# Patient Record
Sex: Male | Born: 1955 | Race: White | Hispanic: No | Smoking: Former smoker
Health system: Southern US, Community
[De-identification: ages and names within clinical notes are randomized; demographics above are authoritative.]

## PROBLEM LIST (undated history)

## (undated) DIAGNOSIS — J449 Chronic obstructive pulmonary disease, unspecified: Secondary | ICD-10-CM

## (undated) DIAGNOSIS — I10 Essential (primary) hypertension: Secondary | ICD-10-CM

## (undated) DIAGNOSIS — U099 Post covid-19 condition, unspecified: Secondary | ICD-10-CM

## (undated) DIAGNOSIS — I509 Heart failure, unspecified: Secondary | ICD-10-CM

---

## 1988-01-14 HISTORY — PX: BACK SURGERY: SHX140

## 2019-07-01 ENCOUNTER — Emergency Department (HOSPITAL_COMMUNITY)
Admission: EM | Admit: 2019-07-01 | Discharge: 2019-07-01 | Disposition: A | Discharge status: Home / Self Care | Payer: No Typology Code available for payment source | Attending: Emergency Medicine | Admitting: Emergency Medicine

## 2019-07-01 ENCOUNTER — Other Ambulatory Visit: Payer: Self-pay

## 2019-07-01 ENCOUNTER — Encounter (HOSPITAL_COMMUNITY): Payer: Self-pay

## 2019-07-01 DIAGNOSIS — I10 Essential (primary) hypertension: Secondary | ICD-10-CM | POA: Insufficient documentation

## 2019-07-01 DIAGNOSIS — Z87891 Personal history of nicotine dependence: Secondary | ICD-10-CM | POA: Diagnosis not present

## 2019-07-01 DIAGNOSIS — R042 Hemoptysis: Secondary | ICD-10-CM | POA: Diagnosis present

## 2019-07-01 DIAGNOSIS — J449 Chronic obstructive pulmonary disease, unspecified: Secondary | ICD-10-CM | POA: Diagnosis not present

## 2019-07-01 DIAGNOSIS — Z79899 Other long term (current) drug therapy: Secondary | ICD-10-CM | POA: Insufficient documentation

## 2019-07-01 HISTORY — DX: Essential (primary) hypertension: I10

## 2019-07-01 HISTORY — DX: Chronic obstructive pulmonary disease, unspecified: J44.9

## 2019-07-01 LAB — CBC WITH DIFFERENTIAL/PLATELET
Abs Immature Granulocytes: 0.06 10*3/uL (ref 0.00–0.07)
Basophils Absolute: 0.1 10*3/uL (ref 0.0–0.1)
Basophils Relative: 1 %
Eosinophils Absolute: 0.3 10*3/uL (ref 0.0–0.5)
Eosinophils Relative: 3 %
HCT: 42.9 % (ref 39.0–52.0)
Hemoglobin: 14.8 g/dL (ref 13.0–17.0)
Immature Granulocytes: 1 %
Lymphocytes Relative: 24 %
Lymphs Abs: 2.2 10*3/uL (ref 0.7–4.0)
MCH: 29.4 pg (ref 26.0–34.0)
MCHC: 34.5 g/dL (ref 30.0–36.0)
MCV: 85.3 fL (ref 80.0–100.0)
Monocytes Absolute: 0.9 10*3/uL (ref 0.1–1.0)
Monocytes Relative: 10 %
Neutro Abs: 5.4 10*3/uL (ref 1.7–7.7)
Neutrophils Relative %: 61 %
Platelets: 269 10*3/uL (ref 150–400)
RBC: 5.03 MIL/uL (ref 4.22–5.81)
RDW: 13 % (ref 11.5–15.5)
WBC: 8.9 10*3/uL (ref 4.0–10.5)
nRBC: 0 % (ref 0.0–0.2)

## 2019-07-01 LAB — COMPREHENSIVE METABOLIC PANEL
ALT: 46 U/L — ABNORMAL HIGH (ref 0–44)
AST: 37 U/L (ref 15–41)
Albumin: 4.2 g/dL (ref 3.5–5.0)
Alkaline Phosphatase: 68 U/L (ref 38–126)
Anion gap: 14 (ref 5–15)
BUN: 13 mg/dL (ref 8–23)
CO2: 25 mmol/L (ref 22–32)
Calcium: 9.5 mg/dL (ref 8.9–10.3)
Chloride: 96 mmol/L — ABNORMAL LOW (ref 98–111)
Creatinine, Ser: 0.88 mg/dL (ref 0.61–1.24)
GFR calc Af Amer: >60 mL/min (ref >60)
GFR calc non Af Amer: >60 mL/min (ref >60)
Glucose, Bld: 101 mg/dL — ABNORMAL HIGH (ref 70–99)
Potassium: 3.4 mmol/L — ABNORMAL LOW (ref 3.5–5.1)
Sodium: 135 mmol/L (ref 135–145)
Total Bilirubin: 0.9 mg/dL (ref 0.3–1.2)
Total Protein: 6.9 g/dL (ref 6.5–8.1)

## 2019-07-01 LAB — PROTIME-INR
INR: 1 (ref 0.8–1.2)
Prothrombin Time: 13 seconds (ref 11.4–15.2)

## 2019-07-01 NOTE — ED Triage Notes (Signed)
Pt arrives POV for eval of spitting out blood. Pt reports that he has spit 2 times this morning and noted that he had flecks of blood in the spit. Pt denies that he was coughing, or vomiting or wretching States he just ""spits like normal" and theres blood in it. Denies abd pain, CP. Denies cough, denies thinners. States unsure if he is bleeding somewhere in his mouth

## 2019-07-01 NOTE — ED Provider Notes (Signed)
[facility] EMERGENCY DEPARTMENT Provider Note   CSN: [id number] Arrival date & time: 07/01/19  1219     History Chief Complaint  Patient presents with  . Spitting Up Blood    Tanner Chang is a 64 y.o. male possible history of COPD, hypertension who presents for evaluation of spitting up blood that began today.  He reports that he chews dip tobacco.  He reports that this morning while chewing, he started noticing some blood when he spit.  He states that he stopped chewing the tobacco and noticed some bright red blood in his spit.  He states he did not cough or vomit.  He states that this was not forceful.  He states again, he did at 11 AM and noticed some blood as well.  Since 11 AM, whenever he spits up, he has blood noted in it.  It is always bright red blood.  He has not any cough or vomiting.  He has had no nosebleeds.  He does not feel any sores or bleeding in his mouth.  He states that only when he spits.  He states he has never had this happen before.  He is not on any blood thinners.  He denies any pain in his nose, teeth.  He denies any fevers, night sweats, weight loss, chest pain, difficulty breathing, abdominal pain, cough, vomiting.   The history is provided by the patient.       Past Medical History:  Diagnosis Date  . COPD (chronic obstructive pulmonary disease) (HCC)   . Hypertension     There are no problems to display for this patient.   History reviewed. No pertinent surgical history.     No family history on file.  Social History   Tobacco Use  . Smoking status: Former Games developer  . Smokeless tobacco: Former Engineer, water Use Topics  . Alcohol use: Yes  . Drug use: Not Currently    Home Medications Prior to Admission medications   Medication Sig Start Date End Date Taking? Authorizing Provider  acetaminophen (TYLENOL) 500 MG tablet Take 1,000 mg by mouth every 6 (six) hours as needed for mild pain or headache.   Yes [provider]  amLODipine (NORVASC) 5 MG tablet Take 5 mg by mouth in the morning.   Yes [provider]  atorvastatin (LIPITOR) 10 MG tablet Take 10 mg by mouth at bedtime.   Yes [provider]  budesonide-formoterol (SYMBICORT) 160-4.5 MCG/ACT inhaler Inhale 2 puffs into the lungs 2 (two) times daily.   Yes [provider]  buPROPion (WELLBUTRIN XL) 300 MG 24 hr tablet Take 300 mg by mouth daily with breakfast.   Yes [provider]  Cholecalciferol (VITAMIN D-3) 25 MCG (1000 UT) CAPS Take 1,000 Units by mouth daily.   Yes [provider]  FLUoxetine (PROZAC) 20 MG capsule Take 20 mg by mouth in the morning.   Yes [provider]  hydrochlorothiazide (HYDRODIURIL) 25 MG tablet Take 25 mg by mouth in the morning.   Yes [provider]  Ipratropium-Albuterol (COMBIVENT RESPIMAT) 20-100 MCG/ACT AERS respimat Inhale 1 puff into the lungs 4 (four) times daily as needed for wheezing or shortness of breath.   Yes [provider]  losartan (COZAAR) 100 MG tablet Take 100 mg by mouth in the morning.   Yes [provider]  Multiple Vitamins-Minerals (ONE-A-DAY MENS 50+ ADVANTAGE) TABS Take 1 tablet by mouth daily with breakfast.   Yes [provider]  omeprazole (PRILOSEC) 20 MG capsule Take 20 mg by mouth daily before breakfast.   Yes [provider]  Oxcarbazepine (TRILEPTAL) 300 MG tablet Take 300 mg by mouth 2 (two) times daily.   Yes [provider]  Psyllium (METAMUCIL PO) Take by mouth See admin instructions. Mix 1 tablespoonful of powder into 8 ounces of water and drink once a day   Yes [provider]  tiotropium (SPIRIVA HANDIHALER) 18 MCG inhalation capsule Place 18 mcg into inhaler and inhale daily.   Yes [provider]    Allergies    Penicillins and Sulfamethoxazole-trimethoprim  Review of Systems   Review of Systems  Constitutional: Negative for fever.  HENT:        Spitting up blood  Respiratory: Negative for cough and shortness of breath.   Cardiovascular: Negative for chest pain.  Gastrointestinal: Negative for abdominal pain, nausea and vomiting.  Genitourinary: Negative for dysuria.  Neurological: Negative for weakness, numbness and headaches.  All other systems reviewed and are negative.   Physical Exam Updated Vital Signs BP (!) 144/91   Pulse (!) 109   Temp 99.2 F (37.3 C) (Oral)   Resp 20   Ht 5\' 6"  (1.676 m)   Wt 80.7 kg   SpO2 94%   BMI 28.73 kg/m   Physical Exam Vitals and nursing note reviewed.  Constitutional:      Appearance: Normal appearance. He is well-developed.     Comments: Anxious. NAD.   HENT:     Head: Normocephalic and atraumatic.     Nose:     Right Nostril: No epistaxis.     Left Nostril: No epistaxis.     Mouth/Throat:     Pharynx: Oropharynx is clear. Posterior oropharyngeal erythema present. No pharyngeal swelling.     Comments: He has some slight posterior oropharynx erythema but no obvious area of bleeding or hemorrhage.  No oral lesions.  No sores to the tongue, underneath the tongue.  No evidence of gingival bleeding. Eyes:     General: Lids are normal.     Conjunctiva/sclera: Conjunctivae normal.     Pupils: Pupils are equal, round, and reactive to light.  Cardiovascular:     Rate and Rhythm: Regular rhythm. Tachycardia present.     Pulses: Normal pulses.     Heart sounds: Normal heart sounds. No murmur heard.  No friction rub. No gallop.   Pulmonary:     Effort: Pulmonary effort is normal.     Breath sounds: Normal breath sounds.     Comments: Lungs clear to auscultation bilaterally.  Symmetric chest rise.  No wheezing, rales, rhonchi. Abdominal:     Palpations: Abdomen is soft. Abdomen is not rigid.     Tenderness: There is no abdominal tenderness. There is no guarding.     Comments: Abdomen is soft, non-distended, non-tender. No rigidity, No guarding. No peritoneal signs.   Musculoskeletal:        General: Normal range of motion.     Cervical back: Full passive range of motion without pain.  Skin:    General: Skin is warm and dry.     Capillary Refill: Capillary refill takes less than 2 seconds.  Neurological:     Mental Status: He is alert and oriented to person, place, and time.  Psychiatric:        Speech: Speech normal.     ED Results / Procedures / Treatments   Labs (all labs ordered are listed, but only abnormal results are displayed)  Labs Reviewed  COMPREHENSIVE METABOLIC PANEL - Abnormal; Notable for the following components:      Result Value   Potassium 3.4 (*)    Chloride 96 (*)    Glucose, Bld 101 (*)    ALT 46 (*)    All other components within normal limits  CBC WITH DIFFERENTIAL/PLATELET  PROTIME-INR    EKG EKG Interpretation  Date/Time:  Friday July 01 2019 20:36:17 EDT Ventricular Rate:  113 PR Interval:    QRS Duration: 93 QT Interval:  336 QTC Calculation: 461 R Axis:   38 Text Interpretation: Sinus tachycardia Low voltage, precordial leads Borderline T abnormalities, inferior leads No prior ecg for comparison Confirmed by Geoffery Lyons ([id number]) on 07/01/2019 8:42:30 PM   Radiology No results found.  Procedures Procedures (including critical care time)  Medications Ordered in ED Medications - No data to display  ED Course  I have reviewed the triage vital signs and the nursing notes.  Pertinent labs & imaging results that were available during my care of the patient were reviewed by me and considered in my medical decision making (see chart for details).    MDM Rules/Calculators/A&P                          64 year old male who presents for evaluation of spitting up blood that began this morning.  He has not had any cough or exertional or forceful vomiting.  No chest pain, difficulty breathing.  Initially arrival, he is afebrile, nontoxic-appearing.  Vital signs are stable.  I did have him spit here in the  emergency department which revealed bright red blood.  I do not see an evidence of obvious sore or lesion in his mouth that would be causing the bleeding.  He has some erythema to his throat but I do not see any obvious lesion that is bleeding.  No bleeding in the nares.  We will plan to check basic labs.  CMP shows potassium of 3.4.  BUN and creatinine within normal limits.  INR is 1.0.  CBC shows no leukocytosis.  Hemoglobin stable at 14.8.  I discussed with Dr. Pollyann Kennedy (ENT).  Given reassuring work-up and that patient is stable, feels that it is appropriate for outpatient management.  Patient can follow-up with ENT office.  At this time, patient is slightly tachycardic.  Expect this most likely anxiety as he is very anxious.  I do not see any obvious source of bleeding in his mouth that would explain his symptoms.  There is no gingival erythema or irritation.  He has no cough or vomiting that is causing the spitting up blood.  At this time, feel he is appropriate for outpatient follow-up with ENT. Discussed patient with Dr. Judd Lien who is agreeable and who evaluated patient.  At this time, no negation that patient needs any further imaging as he does not have any hemoptysis or hematemesis.  Discussed results and plan with patient.  He is agreeable.  Will give outpatient ENT follow-up.  Patient instructed to return emergency department he has any worsening symptoms. At this time, patient exhibits no emergent life-threatening condition that require further evaluation in ED or admission. Patient had ample opportunity for questions and discussion. All patient's questions were answered with full understanding. Strict return precautions discussed. Patient expresses understanding and agreement to plan.   Portions of this note were generated with Scientist, clinical (histocompatibility and immunogenetics). Dictation errors may occur despite best attempts at proofreading.  Final Clinical Impression(s) /  ED Diagnoses Final diagnoses:  Spitting up  blood    Rx / DC Orders ED Discharge Orders    None       Rosana Hoes 07/01/19 2318    Geoffery Lyons, MD 07/03/19 1905

## 2019-07-01 NOTE — ED Notes (Signed)
Pt reports that he has been spititng up bright red blood today and having bright red blood in his BM today, pt reports hx of the same with his IBS

## 2019-07-01 NOTE — Discharge Instructions (Signed)
Your blood work here today looked stable.  Follow-up with referred ENT office.  Call their office arrange for follow-up appointment.  If you start having worsening bleeding, there is a larger amounts, you start coughing up blood or vomiting up blood, start having trouble breathing or any other worsening concerning symptoms, return to the emergency department for further evaluation.

## 2019-12-14 ENCOUNTER — Other Ambulatory Visit: Payer: Self-pay

## 2019-12-14 ENCOUNTER — Emergency Department (HOSPITAL_COMMUNITY)
Admission: EM | Admit: 2019-12-14 | Discharge: 2019-12-14 | Disposition: A | Discharge status: Home / Self Care | Payer: No Typology Code available for payment source | Attending: Emergency Medicine | Admitting: Emergency Medicine

## 2019-12-14 ENCOUNTER — Emergency Department (HOSPITAL_COMMUNITY): Payer: No Typology Code available for payment source

## 2019-12-14 DIAGNOSIS — Z7951 Long term (current) use of inhaled steroids: Secondary | ICD-10-CM | POA: Insufficient documentation

## 2019-12-14 DIAGNOSIS — R000 Tachycardia, unspecified: Secondary | ICD-10-CM | POA: Insufficient documentation

## 2019-12-14 DIAGNOSIS — Z79899 Other long term (current) drug therapy: Secondary | ICD-10-CM | POA: Diagnosis not present

## 2019-12-14 DIAGNOSIS — I10 Essential (primary) hypertension: Secondary | ICD-10-CM | POA: Insufficient documentation

## 2019-12-14 DIAGNOSIS — Z87891 Personal history of nicotine dependence: Secondary | ICD-10-CM | POA: Insufficient documentation

## 2019-12-14 DIAGNOSIS — J441 Chronic obstructive pulmonary disease with (acute) exacerbation: Secondary | ICD-10-CM | POA: Diagnosis not present

## 2019-12-14 DIAGNOSIS — I2510 Atherosclerotic heart disease of native coronary artery without angina pectoris: Secondary | ICD-10-CM | POA: Diagnosis present

## 2019-12-14 DIAGNOSIS — Z20822 Contact with and (suspected) exposure to covid-19: Secondary | ICD-10-CM | POA: Diagnosis not present

## 2019-12-14 DIAGNOSIS — R0602 Shortness of breath: Secondary | ICD-10-CM | POA: Diagnosis present

## 2019-12-14 LAB — TROPONIN I (HIGH SENSITIVITY)
Troponin I (High Sensitivity): 6 ng/L (ref <18)
Troponin I (High Sensitivity): 7 ng/L (ref <18)

## 2019-12-14 LAB — BASIC METABOLIC PANEL
Anion gap: 11 (ref 5–15)
BUN: 17 mg/dL (ref 8–23)
CO2: 28 mmol/L (ref 22–32)
Calcium: 9.5 mg/dL (ref 8.9–10.3)
Chloride: 95 mmol/L — ABNORMAL LOW (ref 98–111)
Creatinine, Ser: 1.05 mg/dL (ref 0.61–1.24)
GFR, Estimated: >60 mL/min (ref >60)
Glucose, Bld: 107 mg/dL — ABNORMAL HIGH (ref 70–99)
Potassium: 3.7 mmol/L (ref 3.5–5.1)
Sodium: 134 mmol/L — ABNORMAL LOW (ref 135–145)

## 2019-12-14 LAB — CBC
HCT: 39.9 % (ref 39.0–52.0)
Hemoglobin: 13.8 g/dL (ref 13.0–17.0)
MCH: 28.9 pg (ref 26.0–34.0)
MCHC: 34.6 g/dL (ref 30.0–36.0)
MCV: 83.6 fL (ref 80.0–100.0)
Platelets: 291 10*3/uL (ref 150–400)
RBC: 4.77 MIL/uL (ref 4.22–5.81)
RDW: 13.8 % (ref 11.5–15.5)
WBC: 8.8 10*3/uL (ref 4.0–10.5)
nRBC: 0 % (ref 0.0–0.2)

## 2019-12-14 LAB — RESP PANEL BY RT-PCR (FLU A&B, COVID) ARPGX2
Influenza A by PCR: NEGATIVE
Influenza B by PCR: NEGATIVE
SARS Coronavirus 2 by RT PCR: NEGATIVE

## 2019-12-14 LAB — BRAIN NATRIURETIC PEPTIDE: B Natriuretic Peptide: 24.4 pg/mL (ref 0.0–100.0)

## 2019-12-14 MED ORDER — IPRATROPIUM-ALBUTEROL 0.5-2.5 (3) MG/3ML IN SOLN
3.0000 mL | Freq: Once | RESPIRATORY_TRACT | Status: AC
  Administered 2019-12-14: 3 mL via RESPIRATORY_TRACT
  Filled 2019-12-14: qty 3

## 2019-12-14 MED ORDER — ALBUTEROL SULFATE HFA 108 (90 BASE) MCG/ACT IN AERS
2.0000 | INHALATION_SPRAY | Freq: Once | RESPIRATORY_TRACT | Status: AC
  Administered 2019-12-14: 2 via RESPIRATORY_TRACT
  Filled 2019-12-14: qty 6.7

## 2019-12-14 MED ORDER — IPRATROPIUM-ALBUTEROL 20-100 MCG/ACT IN AERS
4.0000 | INHALATION_SPRAY | Freq: Once | RESPIRATORY_TRACT | Status: AC
  Administered 2019-12-14: 4 via RESPIRATORY_TRACT
  Filled 2019-12-14: qty 4

## 2019-12-14 MED ORDER — PREDNISONE 20 MG PO TABS: 40.0000 mg | ORAL_TABLET | Freq: Every day | ORAL | 0 refills | Status: AC

## 2019-12-14 MED ORDER — ALBUTEROL SULFATE HFA 108 (90 BASE) MCG/ACT IN AERS: 2.0000 | INHALATION_SPRAY | RESPIRATORY_TRACT | 0 refills | Status: AC | PRN

## 2019-12-14 MED ORDER — METHYLPREDNISOLONE SODIUM SUCC 125 MG IJ SOLR
125.0000 mg | Freq: Once | INTRAMUSCULAR | Status: AC
  Administered 2019-12-14: 125 mg via INTRAVENOUS
  Filled 2019-12-14: qty 2

## 2019-12-14 MED ORDER — IOHEXOL 350 MG/ML SOLN
100.0000 mL | Freq: Once | INTRAVENOUS | Status: AC | PRN
  Administered 2019-12-14: 82 mL via INTRAVENOUS

## 2019-12-14 NOTE — ED Provider Notes (Signed)
[facility] Provider Note   CSN: [id number] Arrival date & time: 12/14/19  1419     History No chief complaint on file.   Tanner Chang is a 64 y.o. male.  COPD hypertension presents to ER with concern for shortness of breath.  Patient reports over the past couple weeks he has had steadily increasing shortness of breath, worse over the last few days, reports he also has had cough, dry nonproductive.  No chest pain.  Breathing worse with exertion.  Also over the last few days has noted increase in leg swelling.  Reports that he was started on a fluid pill yesterday but has not started it.  Denies prior history of heart problems.  HPI     Past Medical History:  Diagnosis Date  . COPD (chronic obstructive pulmonary disease) (HCC)   . Hypertension     Patient Active Problem List   Diagnosis Date Noted  . Coronary artery calcification seen on CT scan 12/14/2019    No past surgical history on file.     No family history on file.  Social History   Tobacco Use  . Smoking status: Former Games developermoker  . Smokeless tobacco: Former Engineer, waterUser  Substance Use Topics  . Alcohol use: Yes  . Drug use: Not Currently    Home Medications Prior to Admission medications   Medication Sig Start Date End Date Taking? Authorizing Provider  acetaminophen (TYLENOL) 500 MG tablet Take 1,000 mg by mouth every 6 (six) hours as needed for mild pain or headache.   Yes [provider]  amLODipine (NORVASC) 5 MG tablet Take 5 mg by mouth in the morning.   Yes [provider]  atorvastatin (LIPITOR) 10 MG tablet Take 10 mg by mouth at bedtime.   Yes [provider]  budesonide-formoterol (SYMBICORT) 160-4.5 MCG/ACT inhaler Inhale 2 puffs into the lungs 2 (two) times daily.   Yes [provider]  buPROPion (WELLBUTRIN XL) 300 MG 24 hr tablet Take 300 mg by mouth daily with breakfast.   Yes [provider]  Cholecalciferol  (VITAMIN D-3) 25 MCG (1000 UT) CAPS Take 1,000 Units by mouth daily.   Yes [provider]  FLUoxetine (PROZAC) 20 MG capsule Take 20 mg by mouth in the morning.   Yes [provider]  hydrochlorothiazide (HYDRODIURIL) 25 MG tablet Take 25 mg by mouth in the morning.   Yes [provider]  Ipratropium-Albuterol (COMBIVENT RESPIMAT) 20-100 MCG/ACT AERS respimat Inhale 1 puff into the lungs 4 (four) times daily as needed for wheezing or shortness of breath.   Yes [provider]  losartan (COZAAR) 100 MG tablet Take 100 mg by mouth in the morning.   Yes [provider]  Multiple Vitamins-Minerals (ONE-A-DAY MENS 50+ ADVANTAGE) TABS Take 1 tablet by mouth daily with breakfast.   Yes [provider]  omeprazole (PRILOSEC) 20 MG capsule Take 20 mg by mouth daily before breakfast.   Yes [provider]  Oxcarbazepine (TRILEPTAL) 300 MG tablet Take 300 mg by mouth 2 (two) times daily.   Yes [provider]  Psyllium (METAMUCIL PO) Take by mouth See admin instructions. Mix 1 tablespoonful of powder into 8 ounces of water and drink once a day   Yes [provider]  tiotropium (SPIRIVA HANDIHALER) 18 MCG inhalation capsule Place 18 mcg into inhaler and inhale daily.   Yes [provider]  albuterol (VENTOLIN HFA) 108 (90 Base) MCG/ACT inhaler Inhale 2 puffs into the  lungs every 4 (four) hours as needed for wheezing or shortness of breath. 12/14/19   Milagros Loll, MD  predniSONE (DELTASONE) 20 MG tablet Take 2 tablets (40 mg total) by mouth daily for 5 days. 12/14/19 12/19/19  Milagros Loll, MD    Allergies    Penicillins and Sulfamethoxazole-trimethoprim  Review of Systems   Review of Systems  Constitutional: Positive for fatigue. Negative for chills and fever.  HENT: Negative for ear pain and sore throat.   Eyes: Negative for pain and visual disturbance.  Respiratory: Positive for cough and shortness of  breath.   Cardiovascular: Positive for leg swelling. Negative for chest pain and palpitations.  Gastrointestinal: Negative for abdominal pain and vomiting.  Genitourinary: Negative for dysuria and hematuria.  Musculoskeletal: Negative for arthralgias and back pain.  Skin: Negative for color change and rash.  Neurological: Negative for seizures and syncope.  All other systems reviewed and are negative.   Physical Exam Updated Vital Signs BP (!) 160/101   Pulse (!) 107   Temp 98.4 F (36.9 C) (Oral)   Resp 20   Ht 5\' 6"  (1.676 m)   Wt 94.8 kg   SpO2 91%   BMI 33.73 kg/m   Physical Exam Vitals and nursing note reviewed.  Constitutional:      Appearance: He is well-developed.  HENT:     Head: Normocephalic and atraumatic.  Eyes:     Conjunctiva/sclera: Conjunctivae normal.  Cardiovascular:     Rate and Rhythm: Regular rhythm. Tachycardia present.     Heart sounds: No murmur heard.   Pulmonary:     Comments: Mild tachypnea, speaking full sentences, expiratory wheezing bilaterally Abdominal:     Palpations: Abdomen is soft.     Tenderness: There is no abdominal tenderness.  Musculoskeletal:     Cervical back: Neck supple.     Comments: Bilateral pitting edema to lower knees  Skin:    General: Skin is warm and dry.  Neurological:     General: No focal deficit present.     Mental Status: He is alert.  Psychiatric:        Mood and Affect: Mood normal.        Behavior: Behavior normal.     ED Results / Procedures / Treatments   Labs (all labs ordered are listed, but only abnormal results are displayed) Labs Reviewed  BASIC METABOLIC PANEL - Abnormal; Notable for the following components:      Result Value   Sodium 134 (*)    Chloride 95 (*)    Glucose, Bld 107 (*)    All other components within normal limits  RESP PANEL BY RT-PCR (FLU A&B, COVID) ARPGX2  CBC  BRAIN NATRIURETIC PEPTIDE  TROPONIN I (HIGH SENSITIVITY)  TROPONIN I (HIGH SENSITIVITY)     EKG EKG Interpretation  Date/Time:  Wednesday December 14 2019 15:11:30 EST Ventricular Rate:  104 PR Interval:  148 QRS Duration: 84 QT Interval:  324 QTC Calculation: 426 R Axis:   57 Text Interpretation: Sinus tachycardia Otherwise normal ECG Confirmed by [id number] (Marianna Fuss) on 12/14/2019 4:43:35 PM   Radiology DG Chest 2 View  Result Date: 12/14/2019 CLINICAL DATA:  Short of breath for 1 day, COPD, history of tobacco abuse EXAM: CHEST - 2 VIEW COMPARISON:  None. FINDINGS: Frontal and lateral views of the chest demonstrate an unremarkable cardiac silhouette. There is elevation of the left hemidiaphragm, of uncertain chronicity. No acute airspace disease, effusion, or pneumothorax. No acute bony abnormalities. IMPRESSION:  1. Elevated left hemidiaphragm of uncertain chronicity. 2. No acute airspace disease. Electronically Signed   By: Sharlet Salina M.D.   On: 12/14/2019 15:50   CT Angio Chest PE W and/or Wo Contrast  Result Date: 12/14/2019 CLINICAL DATA:  c/o sob ,wheezing in triage , pt was started on lasix but states that he misplaced it so he only had one dose EXAM: CT ANGIOGRAPHY CHEST WITH CONTRAST TECHNIQUE: Multidetector CT imaging of the chest was performed using the standard protocol during bolus administration of intravenous contrast. Multiplanar CT image reconstructions and MIPs were obtained to evaluate the vascular anatomy. CONTRAST:  78mL OMNIPAQUE IOHEXOL 350 MG/ML SOLN COMPARISON:  Current chest radiograph. FINDINGS: Cardiovascular: Pulmonary arteries are well opacified. There is no evidence of a pulmonary embolism. Heart is normal in size. No pericardial effusion. Left coronary artery calcifications. Great vessels are normal caliber. Minor aortic atherosclerosis. No dissection. Mediastinum/Nodes: Visualized thyroid is unremarkable. No neck base, axillary, mediastinal or hilar masses or enlarged lymph nodes. Trachea and esophagus are unremarkable. Lungs/Pleura:  Dependent atelectasis at the left lung base above an elevated hemidiaphragm. Remainder of the lungs is clear. No pleural effusion. No pneumothorax. Upper Abdomen: No acute abnormality. Musculoskeletal: No fracture or acute finding. No osteoblastic or osteolytic lesions. Review of the MIP images confirms the above findings. IMPRESSION: 1. No evidence of a pulmonary embolism. 2. No acute findings. No evidence of pneumonia or pulmonary edema. Opacity at the left lung base above an elevated hemidiaphragm is consistent with atelectasis. 3. Left coronary artery calcifications and minor aortic atherosclerosis. Aortic Atherosclerosis (ICD10-I70.0). Electronically Signed   By: Amie Portland M.D.   On: 12/14/2019 18:38    Procedures Procedures (including critical care time)  Medications Ordered in ED Medications  albuterol (VENTOLIN HFA) 108 (90 Base) MCG/ACT inhaler 2 puff (2 puffs Inhalation Given 12/14/19 1842)  methylPREDNISolone sodium succinate (SOLU-MEDROL) 125 mg/2 mL injection 125 mg (125 mg Intravenous Given 12/14/19 1839)  Ipratropium-Albuterol (COMBIVENT) respimat 4 puff (4 puffs Inhalation Given 12/14/19 1838)  iohexol (OMNIPAQUE) 350 MG/ML injection 100 mL (82 mLs Intravenous Contrast Given 12/14/19 1819)  ipratropium-albuterol (DUONEB) 0.5-2.5 (3) MG/3ML nebulizer solution 3 mL (3 mLs Nebulization Given 12/14/19 1956)  albuterol (VENTOLIN HFA) 108 (90 Base) MCG/ACT inhaler 2 puff (2 puffs Inhalation Given 12/14/19 2128)    ED Course  I have reviewed the triage vital signs and the nursing notes.  Pertinent labs & imaging results that were available during my care of the patient were reviewed by me and considered in my medical decision making (see chart for details).    MDM Rules/Calculators/A&P                         64 year old male presents to the emergency room with concern for shortness of breath.  On physical exam patient had mild tachypnea and significant expiratory wheeze but not in  frank distress.  Also noted mild edema in his lower legs.  EKG without acute ischemic change, troponin within normal limits, doubt ACS.  BNP within normal limits, low suspicion for heart failure.  CXR negative for pneumonia.  CTA chest ordered to rule out pulmonary embolism.  Negative for PE.  Radiologist commented on calcifications in his coronary arteries.  As patient has no chest pain, EKG without acute ischemic change and troponin within normal limits, still doubt ACS as presentation first symptoms today.  Given age and risk factors, believe patient would benefit from outpatient cardiology follow-up.  Patient had  marked improvement after receiving neb treatment, steroids.  Did well on ambulation trial.  Believe he is appropriate for trial of outpatient management at this time.  Reviewed return precautions in detail with patient as well as recommendation for close recheck with his primary doctor as well as cardiology.  After the discussed management above, the patient was determined to be safe for discharge.  The patient was in agreement with this plan and all questions regarding their care were answered.  ED return precautions were discussed and the patient will return to the ED with any significant worsening of condition.  Final Clinical Impression(s) / ED Diagnoses Final diagnoses:  COPD exacerbation (HCC)  Coronary artery calcification seen on CT scan    Rx / DC Orders ED Discharge Orders         Ordered    Ambulatory referral to Cardiology       Comments: Left coronary artery calcifications; lower extremity edema - please route recommendations to PCP   12/14/19 2113    albuterol (VENTOLIN HFA) 108 (90 Base) MCG/ACT inhaler  Every 4 hours PRN        12/14/19 2118    predniSONE (DELTASONE) 20 MG tablet  Daily        12/14/19 2118           Milagros Loll, MD 12/15/19 0011

## 2019-12-14 NOTE — Discharge Instructions (Addendum)
Please call your primary doctor for recheck in 24 to 48 hours.  Continue previously prescribed medications.  Start taking the prednisone as discussed.  Use inhaler as needed.  If you feel that your breathing is worsening, you develop any chest pain or difficulty in breathing, return to ER for reassessment.  Given the calcifications on your heart arteries - you should see a heart specialist soon. Call the number provided to set up an appointment.

## 2019-12-14 NOTE — ED Triage Notes (Signed)
Pt here from home with c/o sob ,wheezing in triage , pt was started on lasix but states that he misplaced it so he only had one dose

## 2020-01-04 ENCOUNTER — Emergency Department (HOSPITAL_COMMUNITY)
Admission: EM | Admit: 2020-01-04 | Discharge: 2020-01-04 | Discharge status: Against Medical Advice | Payer: No Typology Code available for payment source

## 2020-01-04 ENCOUNTER — Other Ambulatory Visit: Payer: Self-pay

## 2020-01-04 NOTE — ED Notes (Signed)
Called pt for triage, pt had already turned in labels and left

## 2020-01-06 ENCOUNTER — Encounter (HOSPITAL_COMMUNITY): Payer: Self-pay | Admitting: Emergency Medicine

## 2020-01-06 ENCOUNTER — Other Ambulatory Visit: Payer: Self-pay

## 2020-01-06 ENCOUNTER — Emergency Department (HOSPITAL_COMMUNITY): Payer: No Typology Code available for payment source

## 2020-01-06 ENCOUNTER — Emergency Department (HOSPITAL_COMMUNITY)
Admission: EM | Admit: 2020-01-06 | Discharge: 2020-01-06 | Disposition: A | Discharge status: Home / Self Care | Payer: No Typology Code available for payment source | Attending: Emergency Medicine | Admitting: Emergency Medicine

## 2020-01-06 DIAGNOSIS — R0602 Shortness of breath: Secondary | ICD-10-CM

## 2020-01-06 DIAGNOSIS — Z20822 Contact with and (suspected) exposure to covid-19: Secondary | ICD-10-CM | POA: Insufficient documentation

## 2020-01-06 DIAGNOSIS — Z87891 Personal history of nicotine dependence: Secondary | ICD-10-CM | POA: Diagnosis not present

## 2020-01-06 DIAGNOSIS — I10 Essential (primary) hypertension: Secondary | ICD-10-CM | POA: Insufficient documentation

## 2020-01-06 DIAGNOSIS — J441 Chronic obstructive pulmonary disease with (acute) exacerbation: Secondary | ICD-10-CM | POA: Diagnosis not present

## 2020-01-06 DIAGNOSIS — Z7951 Long term (current) use of inhaled steroids: Secondary | ICD-10-CM | POA: Diagnosis not present

## 2020-01-06 DIAGNOSIS — Z79899 Other long term (current) drug therapy: Secondary | ICD-10-CM | POA: Diagnosis not present

## 2020-01-06 LAB — CBC
HCT: 42.4 % (ref 39.0–52.0)
Hemoglobin: 14.7 g/dL (ref 13.0–17.0)
MCH: 28.9 pg (ref 26.0–34.0)
MCHC: 34.7 g/dL (ref 30.0–36.0)
MCV: 83.5 fL (ref 80.0–100.0)
Platelets: 302 10*3/uL (ref 150–400)
RBC: 5.08 MIL/uL (ref 4.22–5.81)
RDW: 13.9 % (ref 11.5–15.5)
WBC: 7.2 10*3/uL (ref 4.0–10.5)
nRBC: 0 % (ref 0.0–0.2)

## 2020-01-06 LAB — BASIC METABOLIC PANEL
Anion gap: 14 (ref 5–15)
BUN: 20 mg/dL (ref 8–23)
CO2: 30 mmol/L (ref 22–32)
Calcium: 10 mg/dL (ref 8.9–10.3)
Chloride: 96 mmol/L — ABNORMAL LOW (ref 98–111)
Creatinine, Ser: 0.83 mg/dL (ref 0.61–1.24)
GFR, Estimated: >60 mL/min (ref >60)
Glucose, Bld: 105 mg/dL — ABNORMAL HIGH (ref 70–99)
Potassium: 3.1 mmol/L — ABNORMAL LOW (ref 3.5–5.1)
Sodium: 140 mmol/L (ref 135–145)

## 2020-01-06 LAB — RESP PANEL BY RT-PCR (FLU A&B, COVID) ARPGX2
Influenza A by PCR: NEGATIVE
Influenza B by PCR: NEGATIVE
SARS Coronavirus 2 by RT PCR: NEGATIVE

## 2020-01-06 MED ORDER — PREDNISONE 20 MG PO TABS: 40.0000 mg | ORAL_TABLET | Freq: Once | ORAL | Status: DC

## 2020-01-06 MED ORDER — PREDNISONE 20 MG PO TABS: 40.0000 mg | ORAL_TABLET | Freq: Every day | ORAL | 0 refills | Status: DC

## 2020-01-06 MED ORDER — ALBUTEROL SULFATE (2.5 MG/3ML) 0.083% IN NEBU
5.0000 mg | INHALATION_SOLUTION | Freq: Once | RESPIRATORY_TRACT | Status: DC
  Filled 2020-01-06: qty 6

## 2020-01-06 MED ORDER — POTASSIUM CHLORIDE 20 MEQ PO PACK
40.0000 meq | PACK | Freq: Once | ORAL | Status: AC
  Administered 2020-01-06: 15:00:00 40 meq via ORAL
  Filled 2020-01-06: qty 2

## 2020-01-06 MED ORDER — PREDNISONE 20 MG PO TABS: 40.0000 mg | ORAL_TABLET | Freq: Every day | ORAL | 0 refills | Status: AC

## 2020-01-06 MED ORDER — ALBUTEROL SULFATE (2.5 MG/3ML) 0.083% IN NEBU
2.5000 mg | INHALATION_SOLUTION | Freq: Once | RESPIRATORY_TRACT | Status: AC
  Administered 2020-01-06: 16:00:00 2.5 mg via RESPIRATORY_TRACT
  Filled 2020-01-06: qty 3

## 2020-01-06 MED ORDER — METHYLPREDNISOLONE SODIUM SUCC 125 MG IJ SOLR
125.0000 mg | Freq: Once | INTRAMUSCULAR | Status: AC
  Administered 2020-01-06: 15:00:00 125 mg via INTRAVENOUS
  Filled 2020-01-06: qty 2

## 2020-01-06 MED ORDER — IPRATROPIUM-ALBUTEROL 0.5-2.5 (3) MG/3ML IN SOLN
3.0000 mL | Freq: Once | RESPIRATORY_TRACT | Status: AC
  Administered 2020-01-06: 16:00:00 3 mL via RESPIRATORY_TRACT
  Filled 2020-01-06: qty 3
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
## 2020-01-06 NOTE — ED Triage Notes (Signed)
Pt arrives to ED with c/o of shortness of breath since yesterday[place]. Pt reports worsening SOB overnight. Was recently seen here fro COPD exacerbation.

## 2020-01-06 NOTE — ED Notes (Signed)
Pt hoping to be discharged, pt removed O2 and sats remained above 93%

## 2020-01-06 NOTE — ED Notes (Signed)
Pt ambulated to BR on room air. Upon returning to room, SpO2 was at 90%. Pt placed back on 2L [place] and sats improved to 95%.

## 2020-01-06 NOTE — ED Provider Notes (Signed)
I assumed care of patient at shift change from previous team, please see their note for full H&P.  Briefly patient is here for shortness of breath.  Patient has DuoNeb and albuterol neb ordered.  He was originally on oxygen however is now off oxygen.  He does not require oxygen at baseline.  Plan is to reevaluate patient after nebs, anticipating discharge home.  After nebs patient was able to ambulate in the room on room air with his oxygen saturations remaining above 93%.  He states he feels better and wishes to go home.  He is sitting up in bed, respirations are even and unlabored, no significant notable dyspnea .  No obvious distress.  As his pharmacy at the [facility] is closed I printed his prescription for steroids.  Return precautions were discussed with patient who states their understanding.  At the time of discharge patient denied any unaddressed complaints or concerns.  Patient is agreeable for discharge home.  Note: Portions of this report may have been transcribed using voice recognition software. Every effort was made to ensure accuracy; however, inadvertent computerized transcription errors may be present       Norman Clay 01/06/20 1914    Sabas Sous, MD 01/06/20 [phone]

## 2020-01-06 NOTE — ED Notes (Signed)
Pt placed on 2L [place] for sats of 90% 

## 2020-01-25 ENCOUNTER — Other Ambulatory Visit: Payer: Self-pay

## 2020-01-25 ENCOUNTER — Encounter (HOSPITAL_COMMUNITY): Payer: Self-pay | Admitting: Emergency Medicine

## 2020-01-25 ENCOUNTER — Emergency Department (HOSPITAL_COMMUNITY)
Admission: EM | Admit: 2020-01-25 | Discharge: 2020-01-26 | Disposition: A | Discharge status: Home / Self Care | Payer: No Typology Code available for payment source | Attending: Emergency Medicine | Admitting: Emergency Medicine

## 2020-01-25 ENCOUNTER — Emergency Department (HOSPITAL_COMMUNITY): Payer: No Typology Code available for payment source

## 2020-01-25 DIAGNOSIS — R0602 Shortness of breath: Secondary | ICD-10-CM | POA: Diagnosis present

## 2020-01-25 DIAGNOSIS — Z7951 Long term (current) use of inhaled steroids: Secondary | ICD-10-CM | POA: Diagnosis not present

## 2020-01-25 DIAGNOSIS — Z20822 Contact with and (suspected) exposure to covid-19: Secondary | ICD-10-CM | POA: Diagnosis not present

## 2020-01-25 DIAGNOSIS — Z87891 Personal history of nicotine dependence: Secondary | ICD-10-CM | POA: Insufficient documentation

## 2020-01-25 DIAGNOSIS — I10 Essential (primary) hypertension: Secondary | ICD-10-CM | POA: Diagnosis not present

## 2020-01-25 DIAGNOSIS — Z79899 Other long term (current) drug therapy: Secondary | ICD-10-CM | POA: Insufficient documentation

## 2020-01-25 DIAGNOSIS — J441 Chronic obstructive pulmonary disease with (acute) exacerbation: Secondary | ICD-10-CM | POA: Insufficient documentation

## 2020-01-25 LAB — RESP PANEL BY RT-PCR (FLU A&B, COVID) ARPGX2
Influenza A by PCR: NEGATIVE
Influenza B by PCR: NEGATIVE
SARS Coronavirus 2 by RT PCR: NEGATIVE

## 2020-01-25 LAB — BASIC METABOLIC PANEL
Anion gap: 11 (ref 5–15)
BUN: 20 mg/dL (ref 8–23)
CO2: 28 mmol/L (ref 22–32)
Calcium: 10.1 mg/dL (ref 8.9–10.3)
Chloride: 100 mmol/L (ref 98–111)
Creatinine, Ser: 1.05 mg/dL (ref 0.61–1.24)
GFR, Estimated: >60 mL/min (ref >60)
Glucose, Bld: 103 mg/dL — ABNORMAL HIGH (ref 70–99)
Potassium: 4.1 mmol/L (ref 3.5–5.1)
Sodium: 139 mmol/L (ref 135–145)

## 2020-01-25 LAB — TROPONIN I (HIGH SENSITIVITY)
Troponin I (High Sensitivity): 6 ng/L (ref <18)
Troponin I (High Sensitivity): 6 ng/L (ref <18)

## 2020-01-25 LAB — CBC
HCT: 45 % (ref 39.0–52.0)
Hemoglobin: 14.5 g/dL (ref 13.0–17.0)
MCH: 27.9 pg (ref 26.0–34.0)
MCHC: 32.2 g/dL (ref 30.0–36.0)
MCV: 86.7 fL (ref 80.0–100.0)
Platelets: 251 10*3/uL (ref 150–400)
RBC: 5.19 MIL/uL (ref 4.22–5.81)
RDW: 14.6 % (ref 11.5–15.5)
WBC: 8.6 10*3/uL (ref 4.0–10.5)
nRBC: 0 % (ref 0.0–0.2)

## 2020-01-25 NOTE — ED Triage Notes (Signed)
Pt tested covid+ on Jan 4, states he has been asymptomatic until yesterday. C/o shortness of breath, cough and chest congestion/tightness.

## 2020-01-26 MED ORDER — AZITHROMYCIN 250 MG PO TABS: 250.0000 mg | ORAL_TABLET | Freq: Every day | ORAL | 0 refills | Status: DC

## 2020-01-26 MED ORDER — PREDNISONE 50 MG PO TABS: ORAL_TABLET | ORAL | 0 refills | Status: DC

## 2020-01-26 MED ORDER — IPRATROPIUM BROMIDE 0.02 % IN SOLN
0.5000 mg | Freq: Once | RESPIRATORY_TRACT | Status: AC
  Administered 2020-01-26: 0.5 mg via RESPIRATORY_TRACT
  Filled 2020-01-26: qty 2.5

## 2020-01-26 MED ORDER — PREDNISONE 20 MG PO TABS
60.0000 mg | ORAL_TABLET | Freq: Once | ORAL | Status: AC
  Administered 2020-01-26: 60 mg via ORAL
  Filled 2020-01-26: qty 3

## 2020-01-26 MED ORDER — ALBUTEROL SULFATE (2.5 MG/3ML) 0.083% IN NEBU
5.0000 mg | INHALATION_SOLUTION | Freq: Once | RESPIRATORY_TRACT | Status: AC
  Administered 2020-01-26: 5 mg via RESPIRATORY_TRACT
  Filled 2020-01-26: qty 6

## 2020-01-26 NOTE — ED Provider Notes (Signed)
[facility] EMERGENCY DEPARTMENT Provider Note   CSN: [id number] Arrival date & time: 01/25/20  1907     History Chief Complaint  Patient presents with  . Shortness of Breath    Tanner Chang is a 65 y.o. male.  The history is provided by the patient.  Shortness of Breath Severity:  Moderate Onset quality:  Gradual Duration:  2 days Timing:  Intermittent Progression:  Worsening Chronicity:  Recurrent Relieved by:  None tried Worsened by:  Nothing Associated symptoms: cough, sputum production and wheezing   Associated symptoms: no chest pain, no fever and no hemoptysis   Patient with history of COPD and hypertension presents with cough, congestion and shortness of breath.  He reports this started about 2 days ago.  No fevers or chest pain.  He reports green sputum.  Patient reports he tested positive for COVID-19 on January 4.  This was after an asymptomatic exposure. He had no symptoms till approximately 2 days ago Patient is fully vaccinated and boosted for COVID     Past Medical History:  Diagnosis Date  . COPD (chronic obstructive pulmonary disease) (HCC)   . Hypertension     Patient Active Problem List   Diagnosis Date Noted  . Coronary artery calcification seen on CT scan 12/14/2019    History reviewed. No pertinent surgical history.     No family history on file.  Social History   Tobacco Use  . Smoking status: Former Games developer  . Smokeless tobacco: Former Engineer, water Use Topics  . Alcohol use: Yes  . Drug use: Not Currently    Home Medications Prior to Admission medications   Medication Sig Start Date End Date Taking? Authorizing Provider  acetaminophen (TYLENOL) 500 MG tablet Take 1,000 mg by mouth every 6 (six) hours as needed for mild pain or headache.    [provider]  albuterol (VENTOLIN HFA) 108 (90 Base) MCG/ACT inhaler Inhale 2 puffs into the lungs every 4 (four) hours as needed for wheezing or shortness  of breath. 12/14/19   Milagros Loll, MD  amLODipine (NORVASC) 5 MG tablet Take 5 mg by mouth in the morning.    [provider]  atorvastatin (LIPITOR) 10 MG tablet Take 10 mg by mouth at bedtime.    [provider]  budesonide-formoterol (SYMBICORT) 160-4.5 MCG/ACT inhaler Inhale 2 puffs into the lungs 2 (two) times daily.    [provider]  buPROPion (WELLBUTRIN XL) 300 MG 24 hr tablet Take 300 mg by mouth daily with breakfast.    [provider]  Cholecalciferol (VITAMIN D-3) 25 MCG (1000 UT) CAPS Take 1,000 Units by mouth daily.    [provider]  FLUoxetine (PROZAC) 20 MG capsule Take 20 mg by mouth in the morning.    [provider]  hydrochlorothiazide (HYDRODIURIL) 25 MG tablet Take 25 mg by mouth in the morning.    [provider]  Ipratropium-Albuterol (COMBIVENT RESPIMAT) 20-100 MCG/ACT AERS respimat Inhale 1 puff into the lungs 4 (four) times daily as needed for wheezing or shortness of breath.    [provider]  losartan (COZAAR) 100 MG tablet Take 100 mg by mouth in the morning.    [provider]  Multiple Vitamins-Minerals (ONE-A-DAY MENS 50+ ADVANTAGE) TABS Take 1 tablet by mouth daily with breakfast.    [provider]  omeprazole (PRILOSEC) 20 MG capsule Take 20 mg by mouth daily before breakfast.    [provider]  Oxcarbazepine (TRILEPTAL) 300  MG tablet Take 300 mg by mouth 2 (two) times daily.    [provider]  Psyllium (METAMUCIL PO) Take by mouth See admin instructions. Mix 1 tablespoonful of powder into 8 ounces of water and drink once a day    [provider]  tiotropium (SPIRIVA HANDIHALER) 18 MCG inhalation capsule Place 18 mcg into inhaler and inhale daily.    [provider]    Allergies    Penicillins and Sulfamethoxazole-trimethoprim  Review of Systems   Review of Systems  Constitutional: Negative for fever.  Respiratory:  Positive for cough, sputum production, shortness of breath and wheezing. Negative for hemoptysis.   Cardiovascular: Positive for leg swelling. Negative for chest pain.       Chronic leg swelling  All other systems reviewed and are negative.   Physical Exam Updated Vital Signs BP 123/88 (BP Location: Right Arm)   Pulse (!) 101   Temp 98.8 F (37.1 C) (Oral)   Resp (!) 22   SpO2 95%   Physical Exam CONSTITUTIONAL: Elderly, no acute distress HEAD: Normocephalic/atraumatic EYES: EOMI/PERRL ENMT: Mucous membranes moist NECK: supple no meningeal signs SPINE/BACK:entire spine nontender CV: S1/S2 noted, no murmurs/rubs/gallops noted LUNGS: Tachypneic and wheezing bilaterally ABDOMEN: soft, nontender NEURO: Pt is awake/alert/appropriate, moves all extremitiesx4.  No facial droop.   EXTREMITIES: pulses normal/equal, full ROM, chronic swelling to lower extremities SKIN: warm, color normal PSYCH: no abnormalities of mood noted, alert and oriented to situation  ED Results / Procedures / Treatments   Labs (all labs ordered are listed, but only abnormal results are displayed) Labs Reviewed  BASIC METABOLIC PANEL - Abnormal; Notable for the following components:      Result Value   Glucose, Bld 103 (*)    All other components within normal limits  RESP PANEL BY RT-PCR (FLU A&B, COVID) ARPGX2  CBC  TROPONIN I (HIGH SENSITIVITY)  TROPONIN I (HIGH SENSITIVITY)    EKG  ED ECG REPORT   Date: 01/25/2020 1920  Rate: 95  Rhythm: normal sinus rhythm  QRS Axis: normal  Intervals: normal  ST/T Wave abnormalities: nonspecific ST changes  Conduction Disutrbances:none Limitation due to artifact  I have personally reviewed the EKG tracing and agree with the computerized printout as noted.  Radiology DG Chest Portable 1 View  Result Date: 01/25/2020 CLINICAL DATA:  Shortness of breath, COVID EXAM: PORTABLE CHEST 1 VIEW COMPARISON:  01/06/2020 FINDINGS: Elevation of the left  hemidiaphragm, stable. Left base atelectasis or scarring. Small left effusion suspected. Right lung clear. Heart is borderline in size. IMPRESSION: Chronic elevation of the left hemidiaphragm with left base atelectasis or scarring and small left effusion. Electronically Signed   By: Charlett Nose M.D.   On: 01/25/2020 20:19    Procedures Procedures   Medications Ordered in ED Medications  albuterol (PROVENTIL) (2.5 MG/3ML) 0.083% nebulizer solution 5 mg (has no administration in time range)  ipratropium (ATROVENT) nebulizer solution 0.5 mg (has no administration in time range)  predniSONE (DELTASONE) tablet 60 mg (has no administration in time range)    ED Course  I have reviewed the triage vital signs and the nursing notes.  Pertinent labs & imaging results that were available during my care of the patient were reviewed by me and considered in my medical decision making (see chart for details).    MDM Rules/Calculators/A&P                          4:42 AM Patient  with history of COPD presents with increasing cough and shortness of breath. Patient's COVID test now is negative. Suspect this represents a COPD exacerbation.  We will give nebs and steroids. I personally reviewed the chest x-ray and there is no acute findings 6:32 AM Patient now improved after nebs and steroids.  He is awake alert this time.  Wheezing is nearly resolved, he feels comfortable for discharge home.  He is currently COVID-negative. Will place on prednisone, he has albuterol at home, and he request a Z-Pak which has helped him before this seems to be a reasonable plan. Final Clinical Impression(s) / ED Diagnoses Final diagnoses:  COPD exacerbation (HCC)    Rx / DC Orders ED Discharge Orders         Ordered    predniSONE (DELTASONE) 50 MG tablet        01/26/20 0622    azithromycin (ZITHROMAX) 250 MG tablet  Daily        01/26/20 0622           Zadie Rhine, MD 01/26/20 [phone]

## 2020-02-09 LAB — PULMONARY FUNCTION TEST

## 2020-02-16 ENCOUNTER — Emergency Department (HOSPITAL_COMMUNITY): Payer: No Typology Code available for payment source

## 2020-02-16 ENCOUNTER — Inpatient Hospital Stay (HOSPITAL_COMMUNITY)
Admission: EM | Admit: 2020-02-16 | Discharge: 2020-02-20 | DRG: 190 | Disposition: A | Discharge status: Home / Self Care | Payer: No Typology Code available for payment source | Attending: Internal Medicine | Admitting: Internal Medicine

## 2020-02-16 ENCOUNTER — Other Ambulatory Visit: Payer: Self-pay

## 2020-02-16 ENCOUNTER — Encounter (HOSPITAL_COMMUNITY): Payer: Self-pay | Admitting: Emergency Medicine

## 2020-02-16 DIAGNOSIS — J9601 Acute respiratory failure with hypoxia: Secondary | ICD-10-CM | POA: Diagnosis present

## 2020-02-16 DIAGNOSIS — F419 Anxiety disorder, unspecified: Secondary | ICD-10-CM | POA: Diagnosis present

## 2020-02-16 DIAGNOSIS — Z79899 Other long term (current) drug therapy: Secondary | ICD-10-CM

## 2020-02-16 DIAGNOSIS — I493 Ventricular premature depolarization: Secondary | ICD-10-CM | POA: Diagnosis present

## 2020-02-16 DIAGNOSIS — E876 Hypokalemia: Secondary | ICD-10-CM

## 2020-02-16 DIAGNOSIS — M7989 Other specified soft tissue disorders: Secondary | ICD-10-CM | POA: Diagnosis not present

## 2020-02-16 DIAGNOSIS — J441 Chronic obstructive pulmonary disease with (acute) exacerbation: Principal | ICD-10-CM | POA: Diagnosis present

## 2020-02-16 DIAGNOSIS — Z20822 Contact with and (suspected) exposure to covid-19: Secondary | ICD-10-CM | POA: Diagnosis present

## 2020-02-16 DIAGNOSIS — Z87891 Personal history of nicotine dependence: Secondary | ICD-10-CM | POA: Diagnosis not present

## 2020-02-16 DIAGNOSIS — Z7951 Long term (current) use of inhaled steroids: Secondary | ICD-10-CM | POA: Diagnosis not present

## 2020-02-16 DIAGNOSIS — F32A Depression, unspecified: Secondary | ICD-10-CM | POA: Diagnosis present

## 2020-02-16 DIAGNOSIS — R0602 Shortness of breath: Secondary | ICD-10-CM

## 2020-02-16 DIAGNOSIS — Z8616 Personal history of COVID-19: Secondary | ICD-10-CM | POA: Diagnosis not present

## 2020-02-16 DIAGNOSIS — Z880 Allergy status to penicillin: Secondary | ICD-10-CM | POA: Diagnosis not present

## 2020-02-16 DIAGNOSIS — R0600 Dyspnea, unspecified: Secondary | ICD-10-CM | POA: Diagnosis not present

## 2020-02-16 DIAGNOSIS — Z882 Allergy status to sulfonamides status: Secondary | ICD-10-CM | POA: Diagnosis not present

## 2020-02-16 DIAGNOSIS — I10 Essential (primary) hypertension: Secondary | ICD-10-CM | POA: Diagnosis present

## 2020-02-16 LAB — CBC
HCT: 42.6 % (ref 39.0–52.0)
HCT: 41.5 % (ref 39.0–52.0)
Hemoglobin: 13.9 g/dL (ref 13.0–17.0)
Hemoglobin: 14.5 g/dL (ref 13.0–17.0)
MCH: 27.9 pg (ref 26.0–34.0)
MCH: 29.4 pg (ref 26.0–34.0)
MCHC: 32.6 g/dL (ref 30.0–36.0)
MCHC: 34.9 g/dL (ref 30.0–36.0)
MCV: 85.4 fL (ref 80.0–100.0)
MCV: 84 fL (ref 80.0–100.0)
Platelets: 246 10*3/uL (ref 150–400)
Platelets: 260 10*3/uL (ref 150–400)
RBC: 4.99 MIL/uL (ref 4.22–5.81)
RBC: 4.94 MIL/uL (ref 4.22–5.81)
RDW: 15 % (ref 11.5–15.5)
RDW: 15.2 % (ref 11.5–15.5)
WBC: 9 10*3/uL (ref 4.0–10.5)
WBC: 7.9 10*3/uL (ref 4.0–10.5)
nRBC: 0 % (ref 0.0–0.2)
nRBC: 0 % (ref 0.0–0.2)

## 2020-02-16 LAB — BASIC METABOLIC PANEL
Anion gap: 13 (ref 5–15)
BUN: 16 mg/dL (ref 8–23)
CO2: 25 mmol/L (ref 22–32)
Calcium: 9.5 mg/dL (ref 8.9–10.3)
Chloride: 98 mmol/L (ref 98–111)
Creatinine, Ser: 0.8 mg/dL (ref 0.61–1.24)
GFR, Estimated: >60 mL/min (ref >60)
Glucose, Bld: 114 mg/dL — ABNORMAL HIGH (ref 70–99)
Potassium: 3.4 mmol/L — ABNORMAL LOW (ref 3.5–5.1)
Sodium: 136 mmol/L (ref 135–145)

## 2020-02-16 LAB — TROPONIN I (HIGH SENSITIVITY): Troponin I (High Sensitivity): 6 ng/L (ref <18)

## 2020-02-16 LAB — CREATININE, SERUM
Creatinine, Ser: 0.83 mg/dL (ref 0.61–1.24)
GFR, Estimated: >60 mL/min (ref >60)

## 2020-02-16 LAB — D-DIMER, QUANTITATIVE: D-Dimer, Quant: 0.34 ug/mL-FEU (ref 0.00–0.50)

## 2020-02-16 LAB — SARS CORONAVIRUS 2 BY RT PCR (HOSPITAL ORDER, PERFORMED IN ~~LOC~~ HOSPITAL LAB): SARS Coronavirus 2: NEGATIVE

## 2020-02-16 MED ORDER — DOXYCYCLINE HYCLATE 100 MG PO TABS
100.0000 mg | ORAL_TABLET | Freq: Two times a day (BID) | ORAL | Status: DC
  Administered 2020-02-16 – 2020-02-20 (×8): 100 mg via ORAL
  Filled 2020-02-16 (×8): qty 1

## 2020-02-16 MED ORDER — ALBUTEROL SULFATE HFA 108 (90 BASE) MCG/ACT IN AERS
8.0000 | INHALATION_SPRAY | Freq: Once | RESPIRATORY_TRACT | Status: AC
  Administered 2020-02-16: 8 via RESPIRATORY_TRACT
  Filled 2020-02-16: qty 6.7

## 2020-02-16 MED ORDER — POTASSIUM CHLORIDE CRYS ER 20 MEQ PO TBCR
20.0000 meq | EXTENDED_RELEASE_TABLET | Freq: Once | ORAL | Status: AC
  Administered 2020-02-16: 20 meq via ORAL
  Filled 2020-02-16: qty 1

## 2020-02-16 MED ORDER — IPRATROPIUM BROMIDE HFA 17 MCG/ACT IN AERS
2.0000 | INHALATION_SPRAY | Freq: Once | RESPIRATORY_TRACT | Status: AC
  Administered 2020-02-16: 2 via RESPIRATORY_TRACT
  Filled 2020-02-16: qty 12.9

## 2020-02-16 MED ORDER — SODIUM CHLORIDE 0.9% FLUSH
3.0000 mL | Freq: Two times a day (BID) | INTRAVENOUS | Status: DC
  Administered 2020-02-16 – 2020-02-20 (×7): 3 mL via INTRAVENOUS

## 2020-02-16 MED ORDER — ATORVASTATIN CALCIUM 10 MG PO TABS
10.0000 mg | ORAL_TABLET | Freq: Every day | ORAL | Status: DC
  Administered 2020-02-16 – 2020-02-19 (×4): 10 mg via ORAL
  Filled 2020-02-16 (×4): qty 1

## 2020-02-16 MED ORDER — HYDROCHLOROTHIAZIDE 25 MG PO TABS
25.0000 mg | ORAL_TABLET | Freq: Every morning | ORAL | Status: DC
  Administered 2020-02-17 – 2020-02-20 (×4): 25 mg via ORAL
  Filled 2020-02-16 (×4): qty 1

## 2020-02-16 MED ORDER — ENOXAPARIN SODIUM 40 MG/0.4ML ~~LOC~~ SOLN
40.0000 mg | SUBCUTANEOUS | Status: DC
  Administered 2020-02-16 – 2020-02-19 (×4): 40 mg via SUBCUTANEOUS
  Filled 2020-02-16 (×4): qty 0.4

## 2020-02-16 MED ORDER — FLUTICASONE FUROATE-VILANTEROL 200-25 MCG/INH IN AEPB
1.0000 | INHALATION_SPRAY | Freq: Every day | RESPIRATORY_TRACT | Status: DC
  Administered 2020-02-17 – 2020-02-20 (×4): 1 via RESPIRATORY_TRACT
  Filled 2020-02-16 (×2): qty 28

## 2020-02-16 MED ORDER — METHYLPREDNISOLONE SODIUM SUCC 125 MG IJ SOLR
125.0000 mg | Freq: Once | INTRAMUSCULAR | Status: AC
  Administered 2020-02-16: 125 mg via INTRAVENOUS
  Filled 2020-02-16: qty 2

## 2020-02-16 MED ORDER — ALBUTEROL SULFATE HFA 108 (90 BASE) MCG/ACT IN AERS: 6.0000 | INHALATION_SPRAY | Freq: Once | RESPIRATORY_TRACT | Status: AC

## 2020-02-16 MED ORDER — POTASSIUM CHLORIDE CRYS ER 10 MEQ PO TBCR
EXTENDED_RELEASE_TABLET | ORAL | Status: AC
  Filled 2020-02-16: qty 1

## 2020-02-16 MED ORDER — LOSARTAN POTASSIUM 50 MG PO TABS
100.0000 mg | ORAL_TABLET | Freq: Every morning | ORAL | Status: DC
Start: 2020-02-17 — End: 2020-02-20
  Administered 2020-02-17 – 2020-02-20 (×4): 100 mg via ORAL
  Filled 2020-02-16 (×4): qty 2

## 2020-02-16 MED ORDER — ACETAMINOPHEN 650 MG RE SUPP: 650.0000 mg | Freq: Four times a day (QID) | RECTAL | Status: DC | PRN

## 2020-02-16 MED ORDER — SODIUM CHLORIDE 0.9 % IV SOLN: 250.0000 mL | INTRAVENOUS | Status: DC | PRN

## 2020-02-16 MED ORDER — MAGNESIUM SULFATE 2 GM/50ML IV SOLN
2.0000 g | Freq: Once | INTRAVENOUS | Status: AC
  Administered 2020-02-16: 2 g via INTRAVENOUS
  Filled 2020-02-16: qty 50

## 2020-02-16 MED ORDER — SODIUM CHLORIDE 0.9 % IV BOLUS
500.0000 mL | Freq: Once | INTRAVENOUS | Status: AC
  Administered 2020-02-16: 500 mL via INTRAVENOUS

## 2020-02-16 MED ORDER — AMLODIPINE BESYLATE 5 MG PO TABS
5.0000 mg | ORAL_TABLET | Freq: Every morning | ORAL | Status: DC
  Administered 2020-02-17 – 2020-02-20 (×4): 5 mg via ORAL
  Filled 2020-02-16 (×4): qty 1

## 2020-02-16 MED ORDER — ACETAMINOPHEN 325 MG PO TABS: 650.0000 mg | ORAL_TABLET | Freq: Four times a day (QID) | ORAL | Status: DC | PRN

## 2020-02-16 MED ORDER — UMECLIDINIUM BROMIDE 62.5 MCG/INH IN AEPB
1.0000 | INHALATION_SPRAY | Freq: Every day | RESPIRATORY_TRACT | Status: DC
  Administered 2020-02-17 – 2020-02-20 (×4): 1 via RESPIRATORY_TRACT
  Filled 2020-02-16: qty 7

## 2020-02-16 MED ORDER — SODIUM CHLORIDE 0.9% FLUSH
3.0000 mL | INTRAVENOUS | Status: DC | PRN
Start: 2020-02-16 — End: 2020-02-20

## 2020-02-16 MED ORDER — ALBUTEROL SULFATE HFA 108 (90 BASE) MCG/ACT IN AERS
INHALATION_SPRAY | RESPIRATORY_TRACT | Status: AC
  Administered 2020-02-16: 6 via RESPIRATORY_TRACT
  Filled 2020-02-16: qty 6.7

## 2020-02-16 MED ORDER — ALBUTEROL SULFATE HFA 108 (90 BASE) MCG/ACT IN AERS
2.0000 | INHALATION_SPRAY | RESPIRATORY_TRACT | Status: DC | PRN
  Administered 2020-02-17 (×2): 2 via RESPIRATORY_TRACT
  Filled 2020-02-16: qty 6.7

## 2020-02-16 MED ORDER — PREDNISONE 20 MG PO TABS
60.0000 mg | ORAL_TABLET | Freq: Every day | ORAL | Status: DC
  Administered 2020-02-17: 60 mg via ORAL
  Filled 2020-02-16 (×2): qty 3

## 2020-02-16 NOTE — H&P (Signed)
History and Physical    Ramirez Fullbright ZOX:[id number] DOB: 04-11-1955 DOA: 02/16/2020  PCP: [facility]   Patient coming from:  Home  Chief Complaint: SOB  HPI: Ishmail Mcmanamon is a 65 y.o. male with medical history significant for COPD, HTN who presents for evaluation of SOB. Mr. Ropp complains of any shortness of breath over the last 2 days.  He reports a nonproductive cough and wheezing.  He has been using nebulizer breathing treatments at home the last few days every few hours without significant improvement.  He reports to has been seen 3-4 times in the last 6 weeks for similar complaints.  He states he was positive for COVID-19 on January 4 at the [place] but did not require hospitalization and was not treated with monoclonal antibodies or any specific therapies.  He recently finished a short course of steroids from the [place].  States he has not had any fever or chills.  He denies any abdominal pain, nausea, vomiting, diarrhea, chest pain or palpitations.  He does report having swelling of his legs for the last 2 to 3 weeks.  He states he has not had any prolonged immobilization or recent travel.  He has no history of DVT or PE.   ED Course:   Patient was initially 89 to 90% oxygen saturation on room air when he arrived in the emergency room.  ER physician reports he did drop to 88% when ambulating.  He is now on supple oxygen by nasal cannula.  He has been given breathing treatments, magnesium and Solu-Medrol in the emergency room.   Review of Systems:  General: Denies weakness, fever, chills, weight loss, night sweats.  Denies dizziness.  Denies change in appetite HENT: Denies head trauma, headache, denies change in hearing, tinnitus.  Denies nasal congestion or bleeding.  Denies sore throat, sores in mouth.  Denies difficulty swallowing Eyes: Denies blurry vision, pain in eye, drainage.  Denies discoloration of eyes. Neck: Denies pain.  Denies swelling.  Denies pain with  movement. Cardiovascular: Denies chest pain, palpitations.  Reports edema of legs.  Denies orthopnea Respiratory: Reports shortness of breath. Denies cough.  Denies wheezing.  Denies sputum production Gastrointestinal: Denies abdominal pain, swelling.  Denies nausea, vomiting, diarrhea.  Denies melena.  Denies hematemesis. Musculoskeletal: Denies limitation of movement.  Denies deformity or swelling.  Denies pain.  Denies arthralgias or myalgias. Genitourinary: Denies pelvic pain.  Denies urinary frequency or hesitancy.  Denies dysuria.  Skin: Denies rash.  Denies petechiae, purpura, ecchymosis. Neurological: Denies headache.  Denies syncope.  Denies seizure activity.  Denies weakness or paresthesia.  Denies slurred speech, drooping face.  Denies visual change. Psychiatric: Denies depression, anxiety. Denies hallucinations.  Past Medical History:  Diagnosis Date  . COPD (chronic obstructive pulmonary disease) (HCC)   . Hypertension     History reviewed. No pertinent surgical history.  Social History  reports that he has quit smoking. He has quit using smokeless tobacco. He reports current alcohol use. He reports previous drug use.  Allergies  Allergen Reactions  . Penicillins Other (See Comments)    From childhood- reaction not recalled  . Sulfamethoxazole-Trimethoprim Swelling and Other (See Comments)    Tongue swells    History reviewed. No pertinent family history.   Prior to Admission medications   Medication Sig Start Date End Date Taking? Authorizing Provider  acetaminophen (TYLENOL) 500 MG tablet Take 1,000 mg by mouth every 6 (six) hours as needed for mild pain or headache.    [provider]  albuterol (VENTOLIN HFA) 108 (90 Base) MCG/ACT inhaler Inhale 2 puffs into the lungs every 4 (four) hours as needed for wheezing or shortness of breath. 12/14/19   Milagros Loll, MD  amLODipine (NORVASC) 5 MG tablet Take 5 mg by mouth in the morning.    [provider]  atorvastatin (LIPITOR) 10 MG tablet Take 10 mg by mouth at bedtime.    [provider]  azithromycin (ZITHROMAX) 250 MG tablet Take 1 tablet (250 mg total) by mouth daily. Take first 2 tablets together, then 1 every day until finished. 01/26/20   Zadie Rhine, MD  budesonide-formoterol [facility]) 160-4.5 MCG/ACT inhaler Inhale 2 puffs into the lungs 2 (two) times daily.    [provider]  buPROPion (WELLBUTRIN XL) 300 MG 24 hr tablet Take 300 mg by mouth daily with breakfast.    [provider]  Cholecalciferol (VITAMIN D-3) 25 MCG (1000 UT) CAPS Take 1,000 Units by mouth daily.    [provider]  FLUoxetine (PROZAC) 20 MG capsule Take 20 mg by mouth in the morning.    [provider]  hydrochlorothiazide (HYDRODIURIL) 25 MG tablet Take 25 mg by mouth in the morning.    [provider]  Ipratropium-Albuterol (COMBIVENT RESPIMAT) 20-100 MCG/ACT AERS respimat Inhale 1 puff into the lungs 4 (four) times daily as needed for wheezing or shortness of breath.    [provider]  losartan (COZAAR) 100 MG tablet Take 100 mg by mouth in the morning.    [provider]  Multiple Vitamins-Minerals (ONE-A-DAY MENS 50+ ADVANTAGE) TABS Take 1 tablet by mouth daily with breakfast.    [provider]  omeprazole (PRILOSEC) 20 MG capsule Take 20 mg by mouth daily before breakfast.    [provider]  Oxcarbazepine (TRILEPTAL) 300 MG tablet Take 300 mg by mouth 2 (two) times daily.    [provider]  predniSONE (DELTASONE) 50 MG tablet 1 tablet PO QD X4 days 01/26/20   Zadie Rhine, MD  Psyllium (METAMUCIL PO) Take by mouth See admin instructions. Mix 1 tablespoonful of powder into 8 ounces of water and drink once a day    [provider]  tiotropium (SPIRIVA HANDIHALER) 18 MCG inhalation capsule Place 18 mcg into inhaler and inhale daily.    [provider]    Physical  Exam: Vitals:   02/16/20 1954 02/16/20 2004 02/16/20 2135 02/16/20 2141  BP:  (!) 152/73 (!) 174/88   Pulse:  (!) 111 (!) 117 (!) 113  Resp:  (!) 26 (!) 25 (!) 22  Temp: 98 F (36.7 C)     TempSrc: Oral     SpO2:  93% 94% 96%  Weight:      Height:        Constitutional: NAD, calm, comfortable Vitals:   02/16/20 1954 02/16/20 2004 02/16/20 2135 02/16/20 2141  BP:  (!) 152/73 (!) 174/88   Pulse:  (!) 111 (!) 117 (!) 113  Resp:  (!) 26 (!) 25 (!) 22  Temp: 98 F (36.7 C)     TempSrc: Oral     SpO2:  93% 94% 96%  Weight:      Height:       General: WDWN, Alert and oriented x3.  Eyes: EOMI, PERRL, conjunctivae normal.  Sclera nonicteric HENT:  [place]/AT, external ears normal.  Nares patent without epistasis.  Mucous membranes are moist. Posterior pharynx clear of any exudate or lesions. Neck: Soft, normal range of motion, supple,  no masses, no thyromegaly.  Trachea midline Respiratory: Equal breath sounds but diminished. Diffuse rales bilaterally. Has mild expiratory wheezing, no crackles. Normal respiratory effort. No accessory muscle use.  Cardiovascular: Regular rate and rhythm, no murmurs / rubs / gallops. Has +1 lower extremity edema. 2+ pedal pulses.  Abdomen: Soft, no tenderness, nondistended, no rebound or guarding.  No masses palpated. Bowel sounds normoactive Musculoskeletal: FROM. no clubbing / cyanosis. Normal muscle tone.  Skin: Warm, dry, intact no rashes, lesions, ulcers. No induration Neurologic: CN 2-12 grossly intact.  Normal speech.  Sensation intact. Strength 5/5 in all extremities.   Psychiatric: Normal judgment and insight.  Normal mood.    Labs on Admission: I have personally reviewed following labs and imaging studies  CBC: Recent Labs  Lab 02/16/20 1708  WBC 9.0  HGB 13.9  HCT 42.6  MCV 85.4  PLT 246    Basic Metabolic Panel: Recent Labs  Lab 02/16/20 1708  NA 136  K 3.4*  CL 98  CO2 25  GLUCOSE 114*  BUN 16  CREATININE 0.80  CALCIUM 9.5     GFR: Estimated Creatinine Clearance: 95.9 mL/min (by C-G formula based on SCr of 0.8 mg/dL).  Liver Function Tests: No results for input(s): AST, ALT, ALKPHOS, BILITOT, PROT, ALBUMIN in the last 168 hours.  Urine analysis: No results found for: COLORURINE, APPEARANCEUR, LABSPEC, PHURINE, GLUCOSEU, HGBUR, BILIRUBINUR, KETONESUR, PROTEINUR, UROBILINOGEN, NITRITE, LEUKOCYTESUR  Radiological Exams on Admission: DG Chest 2 View  Result Date: 02/16/2020 CLINICAL DATA:  COPD exacerbation onset yesterday, audible wheezing and productive cough. EXAM: CHEST - 2 VIEW COMPARISON:  Chest radiograph January 25, 2020 FINDINGS: The heart size and mediastinal contours are partially obscured but appear unchanged. Unchanged elevation of the left hemidiaphragm with left lower lobe atelectasis versus scarring. No significant pleural effusion or visible pneumothorax. The visualized skeletal structures are unchanged. IMPRESSION: Similar chronic elevation left hemidiaphragm with atelectasis versus scarring in the left lung base. Electronically Signed   By: Maudry Mayhew MD   On: 02/16/2020 17:31    EKG: Independently reviewed. EKG shows sinus tachycardia with occasional PVCs. No acute ST elevation or depression. QTc 434  Assessment/Plan Principal Problem:   COPD with acute exacerbation  Mr. Braband will be admitted to MedSurg floor. Patient is requiring supplemental oxygen to maintain O2 sat between 92 to 96%.  Given Solu-Medrol in the emergency room. We will continue with prednisone 60 mg p.o. daily for the next 3 days. Breo Ellipta once a day for COPD. Spiriva once a day. Albuterol as needed for shortness of breath, cough, wheeze Incentive spirometer every 2 hours will while awake  Active Problems:   Essential hypertension Blood pressure. Continue home medications of Norvasc, losartan and hydrochlorothiazide.    Hypokalemia Patient is given potassium 20 mEq p.o. tonight. We will recheck electrolytes  renal function the morning     DVT prophylaxis: Lovenox for DVT prophylaxis Code Status:   Full code  Family Communication:  Diagnosis and plan discussed with patient. Patient verbalized understanding agrees with plan. Questions answered. Further recommendations to follow as clinical indicated Disposition Plan:   Patient is from:  Home  Anticipated DC to:  Home  Anticipated DC date:  Anticipate 2 midnight stay in the hospital to treat acute condition  Anticipated DC barriers: No barriers to discharge identified at this time  Admission status:  Inpatient   Claudean Severance Abagail Limb MD [facility]  How to contact the [facility] Attending or Consulting provider 7A - 7P or covering  provider during after hours 7P -7A, for this patient?   1. Check the care team in [facility] and look for a) attending/consulting [facility] provider listed and b) the [facility] team listed 2. Log into [email or web address] and use [facility] universal password to access. If you do not have the password, please contact the hospital operator. 3. Locate the [facility] provider you are looking for under [facility] and page to a number that you can be directly reached. 4. If you still have difficulty reaching the provider, please page the [facility] (Director on Call) for the Hospitalists listed on amion for assistance.  02/16/2020, 9:59 PM

## 2020-02-16 NOTE — ED Provider Notes (Signed)
[facility] EMERGENCY DEPARTMENT Provider Note   CSN: [id number] Arrival date & time: 02/16/20  1611     History Chief Complaint  Patient presents with  . Shortness of Breath    Tanner Chang is a 65 y.o. male.  The history is provided by the patient.  Shortness of Breath Severity:  Moderate Onset quality:  Gradual Duration:  2 days Timing:  Constant Progression:  Unchanged Chronicity:  Recurrent Context comment:  COPD exacerbation symptoms. Some relief with nebulizer. Recently finsihed steroids. Former smokers Worsened by:  Activity Associated symptoms: cough and wheezing   Associated symptoms: no abdominal pain, no chest pain, no ear pain, no fever, no rash, no sore throat and no vomiting   Risk factors: no hx of PE/DVT        Past Medical History:  Diagnosis Date  . COPD (chronic obstructive pulmonary disease) (HCC)   . Hypertension     Patient Active Problem List   Diagnosis Date Noted  . Coronary artery calcification seen on CT scan 12/14/2019    History reviewed. No pertinent surgical history.     No family history on file.  Social History   Tobacco Use  . Smoking status: Former Games developer  . Smokeless tobacco: Former Engineer, water Use Topics  . Alcohol use: Yes  . Drug use: Not Currently    Home Medications Prior to Admission medications   Medication Sig Start Date End Date Taking? Authorizing Provider  acetaminophen (TYLENOL) 500 MG tablet Take 1,000 mg by mouth every 6 (six) hours as needed for mild pain or headache.    [provider]  albuterol (VENTOLIN HFA) 108 (90 Base) MCG/ACT inhaler Inhale 2 puffs into the lungs every 4 (four) hours as needed for wheezing or shortness of breath. 12/14/19   Milagros Loll, MD  amLODipine (NORVASC) 5 MG tablet Take 5 mg by mouth in the morning.    [provider]  atorvastatin (LIPITOR) 10 MG tablet Take 10 mg by mouth at bedtime.    [provider]   azithromycin (ZITHROMAX) 250 MG tablet Take 1 tablet (250 mg total) by mouth daily. Take first 2 tablets together, then 1 every day until finished. 01/26/20   Zadie Rhine, MD  budesonide-formoterol [facility]) 160-4.5 MCG/ACT inhaler Inhale 2 puffs into the lungs 2 (two) times daily.    [provider]  buPROPion (WELLBUTRIN XL) 300 MG 24 hr tablet Take 300 mg by mouth daily with breakfast.    [provider]  Cholecalciferol (VITAMIN D-3) 25 MCG (1000 UT) CAPS Take 1,000 Units by mouth daily.    [provider]  FLUoxetine (PROZAC) 20 MG capsule Take 20 mg by mouth in the morning.    [provider]  hydrochlorothiazide (HYDRODIURIL) 25 MG tablet Take 25 mg by mouth in the morning.    [provider]  Ipratropium-Albuterol (COMBIVENT RESPIMAT) 20-100 MCG/ACT AERS respimat Inhale 1 puff into the lungs 4 (four) times daily as needed for wheezing or shortness of breath.    [provider]  losartan (COZAAR) 100 MG tablet Take 100 mg by mouth in the morning.    [provider]  Multiple Vitamins-Minerals (ONE-A-DAY MENS 50+ ADVANTAGE) TABS Take 1 tablet by mouth daily with breakfast.    [provider]  omeprazole (PRILOSEC) 20 MG capsule Take 20 mg by mouth daily before breakfast.    [provider]  Oxcarbazepine (TRILEPTAL) 300 MG tablet Take 300 mg by mouth 2 (two) times  daily.    [provider]  predniSONE (DELTASONE) 50 MG tablet 1 tablet PO QD X4 days 01/26/20   Zadie Rhine, MD  Psyllium (METAMUCIL PO) Take by mouth See admin instructions. Mix 1 tablespoonful of powder into 8 ounces of water and drink once a day    [provider]  tiotropium (SPIRIVA HANDIHALER) 18 MCG inhalation capsule Place 18 mcg into inhaler and inhale daily.    [provider]    Allergies    Penicillins and Sulfamethoxazole-trimethoprim  Review of Systems   Review of Systems  Constitutional: Negative  for chills and fever.  HENT: Negative for ear pain and sore throat.   Eyes: Negative for pain and visual disturbance.  Respiratory: Positive for cough, shortness of breath and wheezing.   Cardiovascular: Negative for chest pain and palpitations.  Gastrointestinal: Negative for abdominal pain and vomiting.  Genitourinary: Negative for dysuria and hematuria.  Musculoskeletal: Negative for arthralgias and back pain.  Skin: Negative for color change and rash.  Neurological: Negative for seizures and syncope.  All other systems reviewed and are negative.   Physical Exam Updated Vital Signs  ED Triage Vitals [02/16/20 1650]  Enc Vitals Group     BP (!) 151/100     Pulse Rate (!) 102     Resp (!) 22     Temp 98.2 F (36.8 C)     Temp src      SpO2 94 %     Weight      Height      Head Circumference      Peak Flow      Pain Score      Pain Loc      Pain Edu?      Excl. in GC?     Physical Exam Vitals and nursing note reviewed.  Constitutional:      General: He is not in acute distress.    Appearance: He is well-developed and well-nourished. He is not ill-appearing.  HENT:     Head: Normocephalic and atraumatic.  Eyes:     Conjunctiva/sclera: Conjunctivae normal.     Pupils: Pupils are equal, round, and reactive to light.  Cardiovascular:     Rate and Rhythm: Normal rate and regular rhythm.     Pulses: Normal pulses.     Heart sounds: Normal heart sounds. No murmur heard.   Pulmonary:     Effort: Pulmonary effort is normal. Tachypnea present. No respiratory distress.     Breath sounds: Decreased breath sounds and wheezing present.     Comments: Poor air movement Abdominal:     Palpations: Abdomen is soft.     Tenderness: There is no abdominal tenderness.  Musculoskeletal:        General: No edema.     Cervical back: Normal range of motion and neck supple.  Skin:    General: Skin is warm and dry.     Capillary Refill: Capillary refill takes less than 2 seconds.   Neurological:     General: No focal deficit present.     Mental Status: He is alert.  Psychiatric:        Mood and Affect: Mood and affect normal.     ED Results / Procedures / Treatments   Labs (all labs ordered are listed, but only abnormal results are displayed) Labs Reviewed  BASIC METABOLIC PANEL - Abnormal; Notable for the following components:      Result Value   Potassium 3.4 (*)  Glucose, Bld 114 (*)    All other components within normal limits  SARS CORONAVIRUS 2 BY RT PCR (HOSPITAL ORDER, PERFORMED IN [facility])  CBC  TROPONIN I (HIGH SENSITIVITY)    EKG  EKG shows sinus rhythm with PVCs, normal intervals, no ischemic changes  Radiology DG Chest 2 View  Result Date: 02/16/2020 CLINICAL DATA:  COPD exacerbation onset yesterday, audible wheezing and productive cough. EXAM: CHEST - 2 VIEW COMPARISON:  Chest radiograph January 25, 2020 FINDINGS: The heart size and mediastinal contours are partially obscured but appear unchanged. Unchanged elevation of the left hemidiaphragm with left lower lobe atelectasis versus scarring. No significant pleural effusion or visible pneumothorax. The visualized skeletal structures are unchanged. IMPRESSION: Similar chronic elevation left hemidiaphragm with atelectasis versus scarring in the left lung base. Electronically Signed   By: Maudry Mayhew MD   On: 02/16/2020 17:31    Procedures .Critical Care Performed by: Virgina Norfolk, DO Authorized by: Virgina Norfolk, DO   Critical care provider statement:    Critical care time (minutes):  35   Critical care was necessary to treat or prevent imminent or life-threatening deterioration of the following conditions:  Respiratory failure   Critical care was time spent personally by me on the following activities:  Blood draw for specimens, development of treatment plan with patient or surrogate, discussions with primary provider, evaluation of patient's response to treatment,  examination of patient, obtaining history from patient or surrogate, ordering and performing treatments and interventions, ordering and review of laboratory studies, ordering and review of radiographic studies, pulse oximetry and re-evaluation of patient's condition   I assumed direction of critical care for this patient from another provider in my specialty: no       Medications Ordered in ED Medications  albuterol (VENTOLIN HFA) 108 (90 Base) MCG/ACT inhaler 8 puff (8 puffs Inhalation Given 02/16/20 1820)  ipratropium (ATROVENT HFA) inhaler 2 puff (2 puffs Inhalation Given 02/16/20 2000)  methylPREDNISolone sodium succinate (SOLU-MEDROL) 125 mg/2 mL injection 125 mg (125 mg Intravenous Given 02/16/20 1817)  sodium chloride 0.9 % bolus 500 mL (0 mLs Intravenous Stopped 02/16/20 2001)  magnesium sulfate IVPB 2 g 50 mL (2 g Intravenous New Bag/Given 02/16/20 1959)  albuterol (VENTOLIN HFA) 108 (90 Base) MCG/ACT inhaler 6 puff (6 puffs Inhalation Given 02/16/20 2114)    ED Course  I have reviewed the triage vital signs and the nursing notes.  Pertinent labs & imaging results that were available during my care of the patient were reviewed by me and considered in my medical decision making (see chart for details).    MDM Rules/Calculators/A&P                          Tanner Chang is a 65 year old male with history of COPD, hypertension who presents to the ED with shortness of breath.  Feels like he is having a COPD exacerbation.  He is not hypoxic.  He has been using his nebulizer at home with some relief.  Symptoms worse over the last 24 hours.  No fever, no major change in sputum production.  Has diminished air movement on exam with mild wheezing.  Have a low suspicion for PE or ACS.  EKG shows sinus rhythm.  Troponin is normal.  Doubt cardiac process.  Doubt PE.  Overall suspect a COPD exacerbation.  He is fully vaccinated against COVID and has been tested for it recently.  Just finished a course of  steroids.  Will give breathing treatment, magnesium, albuterol, Atrovent, Solu-Medrol, normal saline bolus and reevaluate.  Patient still with poor air movement. Hypoxic to 89-90% on room air at rest and worse with ambulation. Placed on 2 L of oxygen. With additional breathing treatments, magnesium patient still with slightly increased work of breathing and overall believe admission for observation and further breathing treatments will be beneficial. He is mentating well he does not feel fatigued and do not believe we need to use BiPAP at this time. Covid test is pending.  This chart was dictated using voice recognition software.  Despite best efforts to proofread,  errors can occur which can change the documentation meaning.    Final Clinical Impression(s) / ED Diagnoses Final diagnoses:  Acute respiratory failure with hypoxia (HCC)  COPD exacerbation [facility])    Rx / DC Orders ED Discharge Orders    None       Virgina Norfolk, DO 02/16/20 2116

## 2020-02-16 NOTE — ED Notes (Signed)
Pt requesting to sit in chair at bedside. This RN offered to find pt recliner, pt said he would rather sit in straight back chair due to hx of back surgery. This RN assisted pt to chair at bedside, pt on monitor.

## 2020-02-16 NOTE — ED Triage Notes (Signed)
Pt arrives to ED with COPD exacerbation started yesterday with audible wheezing and productive cough. He was unable to find his albuterol inhaler but he does have a nebulizer machine at home and has done 3 treatments today. He becomes very short of breath with any exertion.

## 2020-02-17 ENCOUNTER — Other Ambulatory Visit: Payer: Self-pay

## 2020-02-17 DIAGNOSIS — J441 Chronic obstructive pulmonary disease with (acute) exacerbation: Principal | ICD-10-CM

## 2020-02-17 LAB — BASIC METABOLIC PANEL
Anion gap: 13 (ref 5–15)
BUN: 19 mg/dL (ref 8–23)
CO2: 24 mmol/L (ref 22–32)
Calcium: 9.6 mg/dL (ref 8.9–10.3)
Chloride: 100 mmol/L (ref 98–111)
Creatinine, Ser: 0.89 mg/dL (ref 0.61–1.24)
GFR, Estimated: >60 mL/min (ref >60)
Glucose, Bld: 153 mg/dL — ABNORMAL HIGH (ref 70–99)
Potassium: 4.2 mmol/L (ref 3.5–5.1)
Sodium: 137 mmol/L (ref 135–145)

## 2020-02-17 LAB — CBC
HCT: 42.2 % (ref 39.0–52.0)
Hemoglobin: 14 g/dL (ref 13.0–17.0)
MCH: 28.4 pg (ref 26.0–34.0)
MCHC: 33.2 g/dL (ref 30.0–36.0)
MCV: 85.6 fL (ref 80.0–100.0)
Platelets: 263 10*3/uL (ref 150–400)
RBC: 4.93 MIL/uL (ref 4.22–5.81)
RDW: 15.1 % (ref 11.5–15.5)
WBC: 7.6 10*3/uL (ref 4.0–10.5)
nRBC: 0 % (ref 0.0–0.2)

## 2020-02-17 LAB — HIV ANTIBODY (ROUTINE TESTING W REFLEX): HIV Screen 4th Generation wRfx: NONREACTIVE

## 2020-02-17 MED ORDER — FLUOXETINE HCL 20 MG PO CAPS
20.0000 mg | ORAL_CAPSULE | Freq: Every morning | ORAL | Status: DC
  Administered 2020-02-18 – 2020-02-20 (×3): 20 mg via ORAL
  Filled 2020-02-17 (×3): qty 1

## 2020-02-17 MED ORDER — PANTOPRAZOLE SODIUM 40 MG PO TBEC
40.0000 mg | DELAYED_RELEASE_TABLET | Freq: Every day | ORAL | Status: DC
  Administered 2020-02-17 – 2020-02-20 (×4): 40 mg via ORAL
  Filled 2020-02-17 (×4): qty 1

## 2020-02-17 MED ORDER — BUPROPION HCL ER (XL) 150 MG PO TB24
300.0000 mg | ORAL_TABLET | Freq: Every day | ORAL | Status: DC
  Administered 2020-02-18 – 2020-02-20 (×3): 300 mg via ORAL
  Filled 2020-02-17 (×3): qty 2

## 2020-02-17 MED ORDER — OXCARBAZEPINE 150 MG PO TABS
300.0000 mg | ORAL_TABLET | Freq: Two times a day (BID) | ORAL | Status: DC
  Administered 2020-02-17 – 2020-02-20 (×7): 300 mg via ORAL
  Filled 2020-02-17: qty 1
  Filled 2020-02-17 (×3): qty 2
  Filled 2020-02-17 (×3): qty 1
  Filled 2020-02-17: qty 2

## 2020-02-17 MED ORDER — VITAMIN D 25 MCG (1000 UNIT) PO TABS
1000.0000 [IU] | ORAL_TABLET | Freq: Every day | ORAL | Status: DC
  Administered 2020-02-17 – 2020-02-20 (×4): 1000 [IU] via ORAL
  Filled 2020-02-17 (×4): qty 1

## 2020-02-17 MED ORDER — ALBUTEROL SULFATE (2.5 MG/3ML) 0.083% IN NEBU
2.5000 mg | INHALATION_SOLUTION | RESPIRATORY_TRACT | Status: DC | PRN
  Administered 2020-02-17: 2.5 mg via RESPIRATORY_TRACT
  Filled 2020-02-17 (×2): qty 3

## 2020-02-17 MED ORDER — IPRATROPIUM-ALBUTEROL 0.5-2.5 (3) MG/3ML IN SOLN
3.0000 mL | Freq: Four times a day (QID) | RESPIRATORY_TRACT | Status: DC
  Administered 2020-02-17 – 2020-02-20 (×11): 3 mL via RESPIRATORY_TRACT
  Filled 2020-02-17 (×11): qty 3

## 2020-02-17 MED ORDER — ADULT MULTIVITAMIN W/MINERALS CH
1.0000 | ORAL_TABLET | Freq: Every day | ORAL | Status: DC
  Administered 2020-02-18 – 2020-02-20 (×3): 1 via ORAL
  Filled 2020-02-17 (×3): qty 1

## 2020-02-17 NOTE — ED Notes (Signed)
Pt up to recliner watching TV

## 2020-02-17 NOTE — ED Notes (Signed)
Pt states his breathing is better. Having a hard time "sitting still." This nurse has been unsuccessful locating a recliner.

## 2020-02-17 NOTE — ED Notes (Signed)
This rn attempted to transport pt upstairs, pt standing next to bed having increased respiratory exacerbations requesting duo neb before transport upstairs.

## 2020-02-17 NOTE — Progress Notes (Signed)
PROGRESS NOTE    Tanner Chang  ALP:[id number] DOB: [birth year] DOA: 02/16/2020 PCP: [facility]   Brief Narrative:   Patient is a 65 y.o. gentleman with medical history significant for COPD, HTN who complains of increased shortness of breath over the last 2 days.  He reports a productive cough with greenish sputum and wheezing.  He has been using nebulizer breathing treatments at home over the past few days every few hours without noticeable improvement. Over the past 6 weeks he has been seen 3-4 times for similar episodes of shortness of breath.  Patient tested positive for COVID-19 on January 4 at the [place] but covered without require hospitalization but was given a short course of steroids from the [place] which he recently completed. He denies any abdominal pain, nausea, vomiting, diarrhea, chest pain or palpitations.  He does however, report noticeable swelling of his legs for the last 2 to 3 weeks. He denies any prolonged immobilization or recent travel.  He has no history of DVT or PE.   ED Course: On arrival to the ED oxygen saturations were 89-90 % on room air with a drop to 88 % with ambulation.  But was placed on nasal cannula to maintain oxygen saturations 92-96 %.  Assessment & Plan:   Principal Problem:   COPD with acute exacerbation (HCC) Active Problems:   Essential hypertension   Hypokalemia   COPD exacerbation (HCC)   COPD with acute exacerbation -Normally on RA at home, baseline, now requiring supplemental oxygen to maintain oxygen saturations at 92-96 %. -Chest X-ray shows chronic elevation left hemidiaphragm with atelectasis versus scarring in the left lung base. -D-dimer 0.34, EKG sinus tachycardia w occasional PVC's -Doxycycline 100 mg q12 hrs -Solu-Medrol given in the ED -Prednisone 60 mg daily for 3 days -Breo Ellipta once daily and Spiriva once daily, with Albuterol as needed. -Incentive spirometer every hour while awake. -Ambulatory walking challenge for  home oxygen -PT evaluation  Essential hypertension, chronic -Continue home medications of Norvasc, lorsartan, and hydrochlorothiazide -SBP range 140's-150's  Hypokalemia -Initial potassium 3.4, repleted with one time dose of potassium 20 mEq, now up to 4.2 -Resolved, continue to trend with am labs.    DVT prophylaxis: Lovenox Code Status:  Full Code Family Communication:  No family at bedside.  Status is: Inpatient Dispo: The patient is from: Home              Anticipated d/c is to: Home              Anticipated d/c date is:02/19/20               Patient currently is not stable for discharge.   Difficult to place patient No.   Body mass index is 28.89 kg/m.  Consultants:   None  Procedures:   None  Antimicrobials:   Doxycycline   Subjective:  Patient seen and examined at bedside. Patient reports increased work of breathing and having increased shortness of breath over the past 2 days.  Patient reports that it takes him more time to perform daily tasks such as walking to the mailbox, which he can still do but at a slower rate.   Review of Systems Otherwise negative except as per HPI, including: General: Denies fever, chills, night sweats or unintended weight loss. Resp: Reports shortness of breath with exertion.  Reports a productive cough with green-colored sputum and wheezing. Cardiac: Denies chest pain, palpitations, orthopnea, paroxysmal nocturnal dyspnea. GI: Denies abdominal pain, nausea, vomiting, diarrhea or  constipation GU: Denies dysuria, frequency, hesitancy or incontinence MS: Denies muscle aches, joint pain or swelling Neuro: Denies headache, neurologic deficits (focal weakness, numbness, tingling), abnormal gait Psych: Denies anxiety, depression, SI/HI/AVH Skin: Denies new rashes or lesions ID: Denies sick contacts, exotic exposures, travel  Examination:  General exam: Appears calm and comfortable  Respiratory system: Equal breath sounds with  expiratory wheezing throughout bilaterally. Mild tachypnea. No accessory muscle use. Cardiovascular system: S1 & S2 heard, RRR. No JVD, murmurs, rubs, gallops or clicks. No pedal edema. Gastrointestinal system: Abdomen is nondistended, soft and nontender. No organomegaly or masses felt. Normal bowel sounds heard. Central nervous system: Alert and oriented. No focal neurological deficits. Extremities: Symmetric 5 x 5 power. Skin: No rashes, lesions or ulcers Psychiatry: Judgement and insight appear normal. Mood & affect appropriate.     Objective: Vitals:   02/17/20 0800 02/17/20 1000 02/17/20 1200 02/17/20 1400  BP: (!) 141/76 (!) 147/134 (!) 141/116   Pulse: (!) 111 (!) 110 (!) 108 (!) 113  Resp: 20 (!) 25 16 19   Temp:      TempSrc:      SpO2: 92% 95% 91% 94%  Weight:      Height:       No intake or output data in the 24 hours ending 02/17/20 1416 Filed Weights   02/16/20 1756  Weight: 81.2 kg     Data Reviewed:   CBC: Recent Labs  Lab 02/16/20 1708 02/16/20 2202 02/17/20 0527  WBC 9.0 7.9 7.6  HGB 13.9 14.5 14.0  HCT 42.6 41.5 42.2  MCV 85.4 84.0 85.6  PLT 246 260 263   Basic Metabolic Panel: Recent Labs  Lab 02/16/20 1708 02/16/20 2202 02/17/20 0527  NA 136  --  137  K 3.4*  --  4.2  CL 98  --  100  CO2 25  --  24  GLUCOSE 114*  --  153*  BUN 16  --  19  CREATININE 0.80 0.83 0.89  CALCIUM 9.5  --  9.6   GFR: Estimated Creatinine Clearance: 86.2 mL/min (by C-G formula based on SCr of 0.89 mg/dL). Liver Function Tests: No results for input(s): AST, ALT, ALKPHOS, BILITOT, PROT, ALBUMIN in the last 168 hours. No results for input(s): LIPASE, AMYLASE in the last 168 hours. No results for input(s): AMMONIA in the last 168 hours. Coagulation Profile: No results for input(s): INR, PROTIME in the last 168 hours. Cardiac Enzymes: No results for input(s): CKTOTAL, CKMB, CKMBINDEX, TROPONINI in the last 168 hours. BNP (last 3 results) No results for  input(s): PROBNP in the last 8760 hours. HbA1C: No results for input(s): HGBA1C in the last 72 hours. CBG: No results for input(s): GLUCAP in the last 168 hours. Lipid Profile: No results for input(s): CHOL, HDL, LDLCALC, TRIG, CHOLHDL, LDLDIRECT in the last 72 hours. Thyroid Function Tests: No results for input(s): TSH, T4TOTAL, FREET4, T3FREE, THYROIDAB in the last 72 hours. Anemia Panel: No results for input(s): VITAMINB12, FOLATE, FERRITIN, TIBC, IRON, RETICCTPCT in the last 72 hours. Sepsis Labs: No results for input(s): PROCALCITON, LATICACIDVEN in the last 168 hours.  Recent Results (from the past 240 hour(s))  SARS Coronavirus 2 by RT PCR (hospital order, performed in [facility] hospital lab) Nasopharyngeal Nasopharyngeal Swab     Status: None   Collection Time: 02/16/20  8:01 PM   Specimen: Nasopharyngeal Swab  Result Value Ref Range Status   SARS Coronavirus 2 NEGATIVE NEGATIVE Final    Comment: (NOTE) SARS-CoV-2 target nucleic acids  are NOT DETECTED.  The SARS-CoV-2 RNA is generally detectable in upper and lower respiratory specimens during the acute phase of infection. The lowest concentration of SARS-CoV-2 viral copies this assay can detect is 250 copies / mL. A negative result does not preclude SARS-CoV-2 infection and should not be used as the sole basis for treatment or other patient management decisions.  A negative result may occur with improper specimen collection / handling, submission of specimen other than nasopharyngeal swab, presence of viral mutation(s) within the areas targeted by this assay, and inadequate number of viral copies (<250 copies / mL). A negative result must be combined with clinical observations, patient history, and epidemiological information.  Fact Sheet for Patients:   BoilerBrush.com.cy  Fact Sheet for Healthcare Providers: https://pope.com/  This test is not yet approved or  cleared  by the [place] FDA and has been authorized for detection and/or diagnosis of SARS-CoV-2 by FDA under an Emergency Use Authorization (EUA).  This EUA will remain in effect (meaning this test can be used) for the duration of the COVID-19 declaration under Section 564(b)(1) of the Act, 21 U.S.C. section 360bbb-3(b)(1), unless the authorization is terminated or revoked sooner.  Performed at [facility], [address], [place], [place] [address]          Radiology Studies: DG Chest 2 View  Result Date: 02/16/2020 CLINICAL DATA:  COPD exacerbation onset yesterday, audible wheezing and productive cough. EXAM: CHEST - 2 VIEW COMPARISON:  Chest radiograph January 25, 2020 FINDINGS: The heart size and mediastinal contours are partially obscured but appear unchanged. Unchanged elevation of the left hemidiaphragm with left lower lobe atelectasis versus scarring. No significant pleural effusion or visible pneumothorax. The visualized skeletal structures are unchanged. IMPRESSION: Similar chronic elevation left hemidiaphragm with atelectasis versus scarring in the left lung base. Electronically Signed   By: Maudry Mayhew MD   On: 02/16/2020 17:31        Scheduled Meds: . amLODipine  5 mg Oral q AM  . atorvastatin  10 mg Oral QHS  . [START ON 02/18/2020] buPROPion  300 mg Oral Q breakfast  . doxycycline  100 mg Oral Q12H  . enoxaparin (LOVENOX) injection  40 mg Subcutaneous Q24H  . [START ON 02/18/2020] FLUoxetine  20 mg Oral q AM  . fluticasone furoate-vilanterol  1 puff Inhalation Daily  . hydrochlorothiazide  25 mg Oral q AM  . Ipratropium-Albuterol  1 puff Inhalation Q6H  . losartan  100 mg Oral q AM  . [START ON 02/18/2020] One-A-Day Mens 50+ Advantage  1 tablet Oral Q breakfast  . Oxcarbazepine  300 mg Oral BID  . pantoprazole  40 mg Oral Daily  . predniSONE  60 mg Oral Q breakfast  . sodium chloride flush  3 mL Intravenous Q12H  . umeclidinium bromide  1 puff Inhalation  Daily  . Vitamin D-3  1,000 Units Oral Daily   Continuous Infusions: . sodium chloride       LOS: 1 day   Time spent= 35 mins    Dorcas Carrow, MD    If 7PM-7AM, please contact night-coverage  02/17/2020, 2:16 PM

## 2020-02-17 NOTE — ED Notes (Signed)
MS Breakfast Ordered 

## 2020-02-17 NOTE — ED Notes (Signed)
Ellipta was given

## 2020-02-18 ENCOUNTER — Inpatient Hospital Stay (HOSPITAL_COMMUNITY): Payer: No Typology Code available for payment source

## 2020-02-18 DIAGNOSIS — R0600 Dyspnea, unspecified: Secondary | ICD-10-CM

## 2020-02-18 LAB — BLOOD GAS, ARTERIAL
Acid-Base Excess: 4.6 mmol/L — ABNORMAL HIGH (ref 0.0–2.0)
Bicarbonate: 29.8 mmol/L — ABNORMAL HIGH (ref 20.0–28.0)
FIO2: 36
O2 Saturation: 96 %
Patient temperature: 37
pCO2 arterial: 55.1 mmHg — ABNORMAL HIGH (ref 32.0–48.0)
pH, Arterial: 7.353 (ref 7.350–7.450)
pO2, Arterial: 89.2 mmHg (ref 83.0–108.0)

## 2020-02-18 MED ORDER — LORAZEPAM 2 MG/ML IJ SOLN
0.5000 mg | Freq: Once | INTRAMUSCULAR | Status: AC
  Administered 2020-02-18: 0.5 mg via INTRAVENOUS
  Filled 2020-02-18: qty 1

## 2020-02-18 MED ORDER — PERFLUTREN LIPID MICROSPHERE
1.0000 mL | INTRAVENOUS | Status: AC | PRN
  Administered 2020-02-18: 5 mL via INTRAVENOUS
  Filled 2020-02-18: qty 10

## 2020-02-18 MED ORDER — TRAZODONE HCL 50 MG PO TABS
50.0000 mg | ORAL_TABLET | Freq: Every evening | ORAL | Status: DC | PRN
  Administered 2020-02-19 (×2): 50 mg via ORAL
  Filled 2020-02-18 (×2): qty 1

## 2020-02-18 MED ORDER — METHYLPREDNISOLONE SODIUM SUCC 40 MG IJ SOLR
40.0000 mg | Freq: Four times a day (QID) | INTRAMUSCULAR | Status: DC
  Administered 2020-02-18 – 2020-02-20 (×9): 40 mg via INTRAVENOUS
  Filled 2020-02-18 (×9): qty 1

## 2020-02-18 NOTE — Progress Notes (Signed)
  Echocardiogram 2D Echocardiogram has been performed with Definity.  Gerda Diss 02/18/2020, 5:49 PM

## 2020-02-18 NOTE — Progress Notes (Signed)
Pt called nurse station asking for his nurse. This rn arrived to room to find pt sitting in chair with cont labored rr 24-28/min. Pt states he is having a "panick attack" and requesting medication. Will notify and inform md.

## 2020-02-18 NOTE — Progress Notes (Incomplete)
PROGRESS NOTE    Tanner Chang  GQQ:[id number] DOB: 1955/02/18 DOA: 02/16/2020 PCP: [facility]   Brief Narrative:   Patient is a 65 y.o.gentlemanwith medical history significant forCOPD, HTN who complains ofincreased shortness of breath over the last 2 days. He reports a productive cough with greenish sputum and wheezing. He has been using nebulizer breathing treatments at home over the past few days every few hours without noticeable improvement. Over the past 6 weeks he has been seen 3-4 times for similar episodes of shortness of breath.  Patient tested positive for COVID-19 on January 4 at the [place] but covered without require hospitalization but was given a short course of steroids from the [place] which he recently completed.He denies any abdominal pain, nausea, vomiting, diarrhea, chest pain or palpitations. He does however, report noticeable swelling of his legs for the last 2 to 3 weeks. He denies any prolonged immobilization or recent travel. He has no history of DVT or PE.   ED Course: On arrival to the ED oxygen saturations were 89-90 % on room air with a drop to 88 % with ambulation.  But was placed on nasal cannula to maintain oxygen saturations 92-96 %.  Assessment & Plan:   Principal Problem:   COPD with acute exacerbation (HCC) Active Problems:   Essential hypertension   Hypokalemia   COPD exacerbation (HCC)   COPD with acute exacerbation -Normally on RA at home, baseline, now requiring supplemental oxygen to maintain oxygen saturations at 92-96 %. -02/17/20 Chest X-ray shows chronic elevation left hemidiaphragm with atelectasis versus scarring in the left lung base. 02/18/20 Repeat chest X-ray similarly indicates chronic elevation of the left hemidiaphragm and left basilar scarring/atelectasis. Superimposed infection remains in the differential. -02/17/20 EKG sinus tachycardia w occasional PVC's -02/18/20 ABG consistent with compensated respiratory acidosis, pCO2  55.1 -Doxycycline 100 mg q12 hrs -Solu-Medrol given in the ED -Prednisone 60 mg daily for 3 days -Breo Ellipta once daily and Spiriva once daily, with Albuterol as needed. -Incentive spirometer every hour while awake. -Ambulatory walking challenge for home oxygen -PT evaluation -2D echocardiogram ordered to evaluate for congestive heart failure -Low suspicion for DVT, since D-dimer was negative at 0.34.  However, due to persistent leg swelling duplex venous ultrasound ordered to r/o DVT -Patient still having episodes of shortness of breath and increased need for inhaler use, transfer to progressive care bed.  Essential hypertension, chronic -Continue home medications of Norvasc, lorsartan, and hydrochlorothiazide -SBP range 140's-150's  Hypokalemia -Initial potassium 3.4, repleted with one time dose of potassium 20 mEq, now up to 4.2 -Resolved.  Anxiety/depression -Resume home medications including Wellbutrin, Prozac and oxcarbazepine -Add trazodone at night prn for sleep.   DVT prophylaxis: Lovenox Code Status: Full Code Family Communication:  No family at bedside.  Status is: Inpatient  Dispo: The patient is from: Home              Anticipated d/c is to: Home              Anticipated d/c date is: 02/18/20              Patient currently is not medically stable for discharge.   Difficult to place patient: No   Body mass index is 28.89 kg/m.   Consultants:   None  Procedures:   None  Antimicrobials:   Doxycycline   Subjective:  Patient seen and examined at bedside. Patient reports that he is breathing just fine and feels back to normal.  However, he  does appear to have labored breathing and increased work of breathing.  He has both visible and audible wheezing.  Patient reports being anxious all the time and being on disability for chronic anxiety.  Review of Systems Otherwise negative except as per HPI, including: General: Denies fever, chills, night  sweats or unintended weight loss. Resp: Denies cough, wheezing, shortness of breath. Reports shortness of breath with exertion and wheezing.  Denies cough. Cardiac: Denies chest pain, palpitations, orthopnea, paroxysmal nocturnal dyspnea. GI: Denies abdominal pain, nausea, vomiting, diarrhea or constipation GU: Denies dysuria, frequency, hesitancy or incontinence MS: Denies muscle aches, joint pain or swelling Neuro: Denies headache, neurologic deficits (focal weakness, numbness, tingling), abnormal gait Psych: Denies anxiety, depression, SI/HI/AVH Skin: Endorses bilateral legs are warm, red, and have swelling. ID: Denies sick contacts, exotic exposures, travel  Examination:  General exam: Appears calm and comfortable  Respiratory system: Poor air entry with both inspiratory and expiratory wheezing throughout bilaterally. Mild tachypnea. No accessory muscle use. Cardiovascular system: S1 & S2 heard, RRR. No JVD, murmurs, rubs, gallops or clicks. Bilateral +1 pedal edema noted. Gastrointestinal system: Abdomen is nondistended, soft and nontender. No organomegaly or masses felt. Normal bowel sounds heard. Central nervous system: Alert and oriented. No focal neurological deficits. Extremities: Symmetric 5 x 5 power. Skin: Bilateral lower extremities have erythema and warmth. Psychiatry: Judgement and insight appear normal. Appears mildly-moderately anxious.    Objective: Vitals:   02/18/20 0516 02/18/20 0752 02/18/20 1050 02/18/20 1156  BP: (!) 156/105   111/86  Pulse: (!) 102   (!) 102  Resp: 20   19  Temp: 98.5 F (36.9 C)   98.9 F (37.2 C)  TempSrc: Oral   Oral  SpO2: 94% 96% 97% 95%  Weight:      Height:        Intake/Output Summary (Last 24 hours) at 02/18/2020 1345 Last data filed at 02/18/2020 0521 Gross per 24 hour  Intake 480 ml  Output -  Net 480 ml   Filed Weights   02/16/20 1756  Weight: 81.2 kg     Data Reviewed:   CBC: Recent Labs  Lab 02/16/20 1708  02/16/20 2202 02/17/20 0527  WBC 9.0 7.9 7.6  HGB 13.9 14.5 14.0  HCT 42.6 41.5 42.2  MCV 85.4 84.0 85.6  PLT 246 260 263   Basic Metabolic Panel: Recent Labs  Lab 02/16/20 1708 02/16/20 2202 02/17/20 0527  NA 136  --  137  K 3.4*  --  4.2  CL 98  --  100  CO2 25  --  24  GLUCOSE 114*  --  153*  BUN 16  --  19  CREATININE 0.80 0.83 0.89  CALCIUM 9.5  --  9.6   GFR: Estimated Creatinine Clearance: 86.2 mL/min (by C-G formula based on SCr of 0.89 mg/dL). Liver Function Tests: No results for input(s): AST, ALT, ALKPHOS, BILITOT, PROT, ALBUMIN in the last 168 hours. No results for input(s): LIPASE, AMYLASE in the last 168 hours. No results for input(s): AMMONIA in the last 168 hours. Coagulation Profile: No results for input(s): INR, PROTIME in the last 168 hours. Cardiac Enzymes: No results for input(s): CKTOTAL, CKMB, CKMBINDEX, TROPONINI in the last 168 hours. BNP (last 3 results) No results for input(s): PROBNP in the last 8760 hours. HbA1C: No results for input(s): HGBA1C in the last 72 hours. CBG: No results for input(s): GLUCAP in the last 168 hours. Lipid Profile: No results for input(s): CHOL, HDL, LDLCALC, TRIG, CHOLHDL, LDLDIRECT  in the last 72 hours. Thyroid Function Tests: No results for input(s): TSH, T4TOTAL, FREET4, T3FREE, THYROIDAB in the last 72 hours. Anemia Panel: No results for input(s): VITAMINB12, FOLATE, FERRITIN, TIBC, IRON, RETICCTPCT in the last 72 hours. Sepsis Labs: No results for input(s): PROCALCITON, LATICACIDVEN in the last 168 hours.  Recent Results (from the past 240 hour(s))  SARS Coronavirus 2 by RT PCR (hospital order, performed in [facility] hospital lab) Nasopharyngeal Nasopharyngeal Swab     Status: None   Collection Time: 02/16/20  8:01 PM   Specimen: Nasopharyngeal Swab  Result Value Ref Range Status   SARS Coronavirus 2 NEGATIVE NEGATIVE Final    Comment: (NOTE) SARS-CoV-2 target nucleic acids are NOT DETECTED.  The  SARS-CoV-2 RNA is generally detectable in upper and lower respiratory specimens during the acute phase of infection. The lowest concentration of SARS-CoV-2 viral copies this assay can detect is 250 copies / mL. A negative result does not preclude SARS-CoV-2 infection and should not be used as the sole basis for treatment or other patient management decisions.  A negative result may occur with improper specimen collection / handling, submission of specimen other than nasopharyngeal swab, presence of viral mutation(s) within the areas targeted by this assay, and inadequate number of viral copies (<250 copies / mL). A negative result must be combined with clinical observations, patient history, and epidemiological information.  Fact Sheet for Patients:   BoilerBrush.com.cy  Fact Sheet for Healthcare Providers: https://pope.com/  This test is not yet approved or  cleared by the [place] FDA and has been authorized for detection and/or diagnosis of SARS-CoV-2 by FDA under an Emergency Use Authorization (EUA).  This EUA will remain in effect (meaning this test can be used) for the duration of the COVID-19 declaration under Section 564(b)(1) of the Act, 21 U.S.C. section 360bbb-3(b)(1), unless the authorization is terminated or revoked sooner.  Performed at [facility], [address], [place], [place] [address]          Radiology Studies: DG Chest 2 View  Result Date: 02/16/2020 CLINICAL DATA:  COPD exacerbation onset yesterday, audible wheezing and productive cough. EXAM: CHEST - 2 VIEW COMPARISON:  Chest radiograph January 25, 2020 FINDINGS: The heart size and mediastinal contours are partially obscured but appear unchanged. Unchanged elevation of the left hemidiaphragm with left lower lobe atelectasis versus scarring. No significant pleural effusion or visible pneumothorax. The visualized skeletal structures are unchanged.  IMPRESSION: Similar chronic elevation left hemidiaphragm with atelectasis versus scarring in the left lung base. Electronically Signed   By: Maudry Mayhew MD   On: 02/16/2020 17:31        Scheduled Meds: . amLODipine  5 mg Oral q AM  . atorvastatin  10 mg Oral QHS  . buPROPion  300 mg Oral Q breakfast  . cholecalciferol  1,000 Units Oral Daily  . doxycycline  100 mg Oral Q12H  . enoxaparin (LOVENOX) injection  40 mg Subcutaneous Q24H  . FLUoxetine  20 mg Oral q AM  . fluticasone furoate-vilanterol  1 puff Inhalation Daily  . hydrochlorothiazide  25 mg Oral q AM  . ipratropium-albuterol  3 mL Inhalation Q6H  . losartan  100 mg Oral q AM  . methylPREDNISolone (SOLU-MEDROL) injection  40 mg Intravenous Q6H  . multivitamin with minerals  1 tablet Oral Q breakfast  . Oxcarbazepine  300 mg Oral BID  . pantoprazole  40 mg Oral Daily  . sodium chloride flush  3 mL Intravenous Q12H  .  umeclidinium bromide  1 puff Inhalation Daily   Continuous Infusions: . sodium chloride       LOS: 2 days   Time spent= 35 mins  Shearon Balo, RN NP-Student   If 7PM-7AM, please contact night-coverage  02/18/2020, 1:45 PM

## 2020-02-18 NOTE — Progress Notes (Addendum)
Transferred pt to [facility] on cardiac monitor and continuous pulse ox with hob highfowlers and oxygen 3.5 liters [place]. Pt speaking full sentences rr labored 24/min. contd exp/insp wheezes auscultated throughout lung fields. Pt endorses "breathing better" after iv ativan given earlier.

## 2020-02-18 NOTE — Progress Notes (Signed)
PROGRESS NOTE    Tanner Chang  SEG:[id number] DOB: 25-Sep-1955 DOA: 02/16/2020 PCP: [facility]   Brief Narrative:  Patient is a 65 y.o. gentleman with medical history significant for COPD, HTN who complains of increased shortness of breath over the last 2 days.  He reports a productive cough with greenish sputum and wheezing.  He has been using nebulizer breathing treatments at home over the past few days every few hours without noticeable improvement. Over the past 6 weeks he has been seen 3-4 times for similar episodes of shortness of breath.  Patient tested positive for COVID-19 on January 4 at the [place] but recovered without requirng hospitalization but was given a short course of steroids from the [place] which he recently completed. He denies any abdominal pain, nausea, vomiting, diarrhea, chest pain or palpitations.  He does however, report noticeable swelling of his legs for the last 2 to 3 weeks. He denies any prolonged immobilization or recent travel.  He has no history of DVT or PE.   ED Course: On arrival to the ED oxygen saturations were 89-90 % on room air with a drop to 88 % with ambulation.  But was placed on nasal cannula to maintain oxygen saturations 92-96 %.  Assessment & Plan:   Principal Problem:   COPD with acute exacerbation (HCC) Active Problems:   Essential hypertension   Hypokalemia   COPD exacerbation (HCC)   COPD with acute exacerbation -Normally on RA at home, baseline, now requiring supplemental oxygen to maintain oxygen saturations at 92-96 %. -Chest X-ray shows chronic elevation left hemidiaphragm with atelectasis versus scarring in the left lung base. -D-dimer 0.34, EKG sinus tachycardia w occasional PVC's.  No suspicion of pulmonary embolism. -Doxycycline 100 mg q12 hrs -Solu-Medrol 40 mg IV every 6 hours. -Breo Ellipta once daily and Spiriva once daily, with Albuterol as needed. -Incentive spirometer every hour while awake. -Ambulatory walking  challenge for home oxygen when ready. -2/5, continues to have bronchospasm and shortness of breath. Repeat chest x-ray with no obvious complications.  No new consolidation. ABG fairly stable. Patient has more bronchospasm, needing frequent bronchodilator therapy and monitoring.  Transfer to progressive care bed. We will check duplex of the lower extremities to rule out DVT due to persistent swelling.  D-dimer was negative. Check 2D echocardiogram to look for any evidence of congestive heart failure.  Essential hypertension, chronic -Continue home medications of Norvasc, lorsartan, and hydrochlorothiazide -SBP range 140's-150's  Hypokalemia -Initial potassium 3.4, repleted with one time dose of potassium 20 mEq, now up to 4.2  Anxiety/depression -Patient is on multiple medications at home including Wellbutrin, Prozac and oxcarbazepine that is resumed. -Uses trazodone at night for sleep, will prescribe as needed. May benefit with intermittent benzodiazepine to help with breathing and anxiety.    DVT prophylaxis: Lovenox Code Status:  Full Code Family Communication:  No family at bedside.  Status is: Inpatient Dispo: The patient is from: Home              Anticipated d/c is to: Home              Anticipated d/c date is:02/19/20               Patient currently is not stable for discharge.   Difficult to place patient No.   Body mass index is 28.89 kg/m.  Consultants:   None  Procedures:   None  Antimicrobials:   Doxycycline   Subjective: Patient seen and examined.  Overnight events  noted.  Patient himself tells me that he is feeling fine and better. He spent most of the night on recliner that he uses at home because of his back pain.  He is audibly wheezing and on 3 L oxygen.  Afebrile.  Examination:  General exam: Patient appears anxious in mild distress. Respiratory system: Poor air entry bilateral.  He has both expiratory and inspiratory wheezes.  Mild tachypnea.  No accessory muscle use. Cardiovascular system: S1 & S2 heard, RRR. No JVD, murmurs, rubs, gallops or clicks. Patient has bilateral 1+ pedal edema.  Nontender.  Gastrointestinal system: Abdomen is nondistended, soft and nontender. No organomegaly or masses felt. Normal bowel sounds heard. Central nervous system: Alert and oriented. No focal neurological deficits. Extremities: Symmetric 5 x 5 power. Skin: No rashes, lesions or ulcers Psychiatry: Judgement and insight appear normal. Mood & affect anxious.    Objective: Vitals:   02/18/20 0516 02/18/20 0752 02/18/20 1050 02/18/20 1156  BP: (!) 156/105   111/86  Pulse: (!) 102   (!) 102  Resp: 20   19  Temp: 98.5 F (36.9 C)   98.9 F (37.2 C)  TempSrc: Oral   Oral  SpO2: 94% 96% 97% 95%  Weight:      Height:        Intake/Output Summary (Last 24 hours) at 02/18/2020 1353 Last data filed at 02/18/2020 0521 Gross per 24 hour  Intake 480 ml  Output -  Net 480 ml   Filed Weights   02/16/20 1756  Weight: 81.2 kg     Data Reviewed:   CBC: Recent Labs  Lab 02/16/20 1708 02/16/20 2202 02/17/20 0527  WBC 9.0 7.9 7.6  HGB 13.9 14.5 14.0  HCT 42.6 41.5 42.2  MCV 85.4 84.0 85.6  PLT 246 260 263   Basic Metabolic Panel: Recent Labs  Lab 02/16/20 1708 02/16/20 2202 02/17/20 0527  NA 136  --  137  K 3.4*  --  4.2  CL 98  --  100  CO2 25  --  24  GLUCOSE 114*  --  153*  BUN 16  --  19  CREATININE 0.80 0.83 0.89  CALCIUM 9.5  --  9.6   GFR: Estimated Creatinine Clearance: 86.2 mL/min (by C-G formula based on SCr of 0.89 mg/dL). Liver Function Tests: No results for input(s): AST, ALT, ALKPHOS, BILITOT, PROT, ALBUMIN in the last 168 hours. No results for input(s): LIPASE, AMYLASE in the last 168 hours. No results for input(s): AMMONIA in the last 168 hours. Coagulation Profile: No results for input(s): INR, PROTIME in the last 168 hours. Cardiac Enzymes: No results for input(s): CKTOTAL, CKMB, CKMBINDEX, TROPONINI  in the last 168 hours. BNP (last 3 results) No results for input(s): PROBNP in the last 8760 hours. HbA1C: No results for input(s): HGBA1C in the last 72 hours. CBG: No results for input(s): GLUCAP in the last 168 hours. Lipid Profile: No results for input(s): CHOL, HDL, LDLCALC, TRIG, CHOLHDL, LDLDIRECT in the last 72 hours. Thyroid Function Tests: No results for input(s): TSH, T4TOTAL, FREET4, T3FREE, THYROIDAB in the last 72 hours. Anemia Panel: No results for input(s): VITAMINB12, FOLATE, FERRITIN, TIBC, IRON, RETICCTPCT in the last 72 hours. Sepsis Labs: No results for input(s): PROCALCITON, LATICACIDVEN in the last 168 hours.  Recent Results (from the past 240 hour(s))  SARS Coronavirus 2 by RT PCR (hospital order, performed in [facility] hospital lab) Nasopharyngeal Nasopharyngeal Swab     Status: None   Collection Time: 02/16/20  8:01 PM   Specimen: Nasopharyngeal Swab  Result Value Ref Range Status   SARS Coronavirus 2 NEGATIVE NEGATIVE Final    Comment: (NOTE) SARS-CoV-2 target nucleic acids are NOT DETECTED.  The SARS-CoV-2 RNA is generally detectable in upper and lower respiratory specimens during the acute phase of infection. The lowest concentration of SARS-CoV-2 viral copies this assay can detect is 250 copies / mL. A negative result does not preclude SARS-CoV-2 infection and should not be used as the sole basis for treatment or other patient management decisions.  A negative result may occur with improper specimen collection / handling, submission of specimen other than nasopharyngeal swab, presence of viral mutation(s) within the areas targeted by this assay, and inadequate number of viral copies (<250 copies / mL). A negative result must be combined with clinical observations, patient history, and epidemiological information.  Fact Sheet for Patients:   BoilerBrush.com.cy  Fact Sheet for Healthcare  Providers: https://pope.com/  This test is not yet approved or  cleared by the [place] FDA and has been authorized for detection and/or diagnosis of SARS-CoV-2 by FDA under an Emergency Use Authorization (EUA).  This EUA will remain in effect (meaning this test can be used) for the duration of the COVID-19 declaration under Section 564(b)(1) of the Act, 21 U.S.C. section 360bbb-3(b)(1), unless the authorization is terminated or revoked sooner.  Performed at [facility], [address], [place], [place] [address]          Radiology Studies: DG Chest 2 View  Result Date: 02/16/2020 CLINICAL DATA:  COPD exacerbation onset yesterday, audible wheezing and productive cough. EXAM: CHEST - 2 VIEW COMPARISON:  Chest radiograph January 25, 2020 FINDINGS: The heart size and mediastinal contours are partially obscured but appear unchanged. Unchanged elevation of the left hemidiaphragm with left lower lobe atelectasis versus scarring. No significant pleural effusion or visible pneumothorax. The visualized skeletal structures are unchanged. IMPRESSION: Similar chronic elevation left hemidiaphragm with atelectasis versus scarring in the left lung base. Electronically Signed   By: Maudry Mayhew MD   On: 02/16/2020 17:31   DG CHEST PORT 1 VIEW  Result Date: 02/18/2020 CLINICAL DATA:  Shortness of breath EXAM: PORTABLE CHEST 1 VIEW COMPARISON:  February 16, 2020 FINDINGS: The cardiomediastinal silhouette is unchanged in contour with chronic elevation of LEFT hemidiaphragm.Atherosclerotic calcifications of the aorta. Unchanged LEFT-sided pleural blunting. No pneumothorax. Homogeneous opacity of the LEFT lung base, unchanged in comparison to prior and likely reflecting scarring/atelectasis. Visualized abdomen is unremarkable. Remote bilateral rib fractures. IMPRESSION: Chronic elevation of the LEFT hemidiaphragm and LEFT basilar scarring/atelectasis. Superimposed infection  remains in the differential. Electronically Signed   By: Meda Klinefelter MD   On: 02/18/2020 13:44        Scheduled Meds: . amLODipine  5 mg Oral q AM  . atorvastatin  10 mg Oral QHS  . buPROPion  300 mg Oral Q breakfast  . cholecalciferol  1,000 Units Oral Daily  . doxycycline  100 mg Oral Q12H  . enoxaparin (LOVENOX) injection  40 mg Subcutaneous Q24H  . FLUoxetine  20 mg Oral q AM  . fluticasone furoate-vilanterol  1 puff Inhalation Daily  . hydrochlorothiazide  25 mg Oral q AM  . ipratropium-albuterol  3 mL Inhalation Q6H  . losartan  100 mg Oral q AM  . methylPREDNISolone (SOLU-MEDROL) injection  40 mg Intravenous Q6H  . multivitamin with minerals  1 tablet Oral Q breakfast  . Oxcarbazepine  300 mg Oral BID  .  pantoprazole  40 mg Oral Daily  . sodium chloride flush  3 mL Intravenous Q12H  . umeclidinium bromide  1 puff Inhalation Daily   Continuous Infusions: . sodium chloride       LOS: 2 days   Time spent= 35 minutes    Dorcas Carrow, MD    If 7PM-7AM, please contact night-coverage  02/18/2020, 1:53 PM

## 2020-02-18 NOTE — Progress Notes (Signed)
Pt leaning forward while sitting up in chair, pt just returning from bathroom with labored rr 24/min and anxious, RA sat 93%. Pt reports having to sleep in recliner with 2 pillows at home. Pt reports now able to sleep with only a towel under neck.

## 2020-02-19 LAB — ECHOCARDIOGRAM COMPLETE
AR max vel: 3.27 cm2
AV Area VTI: 3.17 cm2
AV Area mean vel: 3.02 cm2
AV Mean grad: 3.5 mmHg
AV Peak grad: 6.2 mmHg
Ao pk vel: 1.25 m/s
Area-P 1/2: 7.37 cm2
Height: 66 in
S' Lateral: 2.9 cm
Weight: 2864 oz

## 2020-02-19 NOTE — Progress Notes (Signed)
   02/19/20 0114  Assess: MEWS Score  Temp 98.6 F (37 C)  BP 127/86  Pulse Rate (!) 112  ECG Heart Rate (!) 113  Resp 19  SpO2 93 %  O2 Device Nasal Cannula  Assess: MEWS Score  MEWS Temp 0  MEWS Systolic 0  MEWS Pulse 2  MEWS RR 0  MEWS LOC 0  MEWS Score 2  MEWS Score Color Yellow  Assess: if the MEWS score is Yellow or Red  Were vital signs taken at a resting state? Yes  Focused Assessment No change from prior assessment  Early Detection of Sepsis Score *See Row Information* Low  MEWS guidelines implemented *See Row Information* Yes  Treat  MEWS Interventions Escalated (See documentation below)  Take Vital Signs  Increase Vital Sign Frequency  Yellow: Q 2hr X 2 then Q 4hr X 2, if remains yellow, continue Q 4hrs  Escalate  MEWS: Escalate Yellow: discuss with charge nurse/RN and consider discussing with provider and RRT  Notify: Charge Nurse/RN  Name of Charge Nurse/RN Notified Heather RN  Date Charge Nurse/RN Notified 02/19/20  Time Charge Nurse/RN Notified 0130

## 2020-02-19 NOTE — Progress Notes (Signed)
Patient was unstable and being transferred to the floor.  Attempt study at a later time.

## 2020-02-19 NOTE — Progress Notes (Signed)
PROGRESS NOTE    Tanner Chang  HLK:[id number] DOB: 04/04/1955 DOA: 02/16/2020 PCP: [facility]   Brief Narrative:  Patient is a 65 y.o. gentleman with medical history significant for COPD, HTN who complains of increased shortness of breath over the last 2 days.  He reports a productive cough with greenish sputum and wheezing.  He has been using nebulizer breathing treatments at home over the past few days every few hours without noticeable improvement. Over the past 6 weeks he has been seen 3-4 times for similar episodes of shortness of breath.  Patient tested positive for COVID-19 on January 4 at the [place] but recovered without requirng hospitalization but was given a short course of steroids from the [place] which he recently completed. He denies any abdominal pain, nausea, vomiting, diarrhea, chest pain or palpitations.  He does however, report noticeable swelling of his legs for the last 2 to 3 weeks. He denies any prolonged immobilization or recent travel.  He has no history of DVT or PE.   ED Course: On arrival to the ED oxygen saturations were 89-90 % on room air with a drop to 88 % with ambulation.  But was placed on nasal cannula to maintain oxygen saturations 92-96 %.  Assessment & Plan:   Principal Problem:   COPD with acute exacerbation (HCC) Active Problems:   Essential hypertension   Hypokalemia   COPD exacerbation (HCC)   COPD with acute exacerbation Patient with some clinical improvement today.  Oxygen weaning off. Continue IV steroids, doxycycline. On inhalational steroids and frequent nebulizers. Repeat chest x-ray stable.  ABG fairly stable. 2D echocardiogram with normal ejection fraction. Lower extremity duplex is pending. D-dimer was normal. Continue to mobilize.  Essential hypertension, chronic -Continue home medications of Norvasc, lorsartan, and hydrochlorothiazide -SBP range 140's-150's  Hypokalemia -Replaced and  normalized.  Anxiety/depression -Patient is on multiple medications at home including Wellbutrin, Prozac and oxcarbazepine that is resumed. -Uses trazodone at night for sleep, will prescribe as needed.    DVT prophylaxis: Lovenox Code Status:  Full Code Family Communication:  No family at bedside.  Sister on the phone 2/5.  Status is: Inpatient Dispo: The patient is from: Home              Anticipated d/c is to: Home              Anticipated d/c date is: 2/7.              Patient currently is not stable for discharge.   Difficult to place patient No.   Body mass index is 31.41 kg/m.  Consultants:   None  Procedures:   None  Antimicrobials:   Doxycycline   Subjective: Patient seen and examined.  Overall his breathing is better.  Still feels shaky and tremulous on movement. Ambulated with occasional drop in oxygen to 89%, overall improving.  Examination:  General exam: Patient appears anxious in mild distress. Respiratory system: Poor air entry bilateral.  Mildly tachypneic.  Not in any distress.  No accessory muscle use. Cardiovascular system: S1 & S2 heard, RRR. No JVD, murmurs, rubs, gallops or clicks. Patient has bilateral 1+ pedal edema.  Nontender.  Gastrointestinal system: Abdomen is nondistended, soft and nontender. No organomegaly or masses felt. Normal bowel sounds heard. Central nervous system: Alert and oriented. No focal neurological deficits. Extremities: Symmetric 5 x 5 power. Skin: No rashes, lesions or ulcers Psychiatry: Judgement and insight appear normal. Mood & affect anxious.    Objective: Vitals:  02/19/20 0933 02/19/20 1107 02/19/20 1440 02/19/20 1505  BP:   128/72   Pulse:   (!) 111   Resp:   18   Temp:   98.5 F (36.9 C)   TempSrc:   Oral   SpO2: 91% 92% 96% (!) 89%  Weight:      Height:        Intake/Output Summary (Last 24 hours) at 02/19/2020 1638 Last data filed at 02/19/2020 0100 Gross per 24 hour  Intake --  Output 825  ml  Net -825 ml   Filed Weights   02/16/20 1756 02/19/20 0510  Weight: 81.2 kg 88.3 kg     Data Reviewed:   CBC: Recent Labs  Lab 02/16/20 1708 02/16/20 2202 02/17/20 0527  WBC 9.0 7.9 7.6  HGB 13.9 14.5 14.0  HCT 42.6 41.5 42.2  MCV 85.4 84.0 85.6  PLT 246 260 263   Basic Metabolic Panel: Recent Labs  Lab 02/16/20 1708 02/16/20 2202 02/17/20 0527  NA 136  --  137  K 3.4*  --  4.2  CL 98  --  100  CO2 25  --  24  GLUCOSE 114*  --  153*  BUN 16  --  19  CREATININE 0.80 0.83 0.89  CALCIUM 9.5  --  9.6   GFR: Estimated Creatinine Clearance: 89.6 mL/min (by C-G formula based on SCr of 0.89 mg/dL). Liver Function Tests: No results for input(s): AST, ALT, ALKPHOS, BILITOT, PROT, ALBUMIN in the last 168 hours. No results for input(s): LIPASE, AMYLASE in the last 168 hours. No results for input(s): AMMONIA in the last 168 hours. Coagulation Profile: No results for input(s): INR, PROTIME in the last 168 hours. Cardiac Enzymes: No results for input(s): CKTOTAL, CKMB, CKMBINDEX, TROPONINI in the last 168 hours. BNP (last 3 results) No results for input(s): PROBNP in the last 8760 hours. HbA1C: No results for input(s): HGBA1C in the last 72 hours. CBG: No results for input(s): GLUCAP in the last 168 hours. Lipid Profile: No results for input(s): CHOL, HDL, LDLCALC, TRIG, CHOLHDL, LDLDIRECT in the last 72 hours. Thyroid Function Tests: No results for input(s): TSH, T4TOTAL, FREET4, T3FREE, THYROIDAB in the last 72 hours. Anemia Panel: No results for input(s): VITAMINB12, FOLATE, FERRITIN, TIBC, IRON, RETICCTPCT in the last 72 hours. Sepsis Labs: No results for input(s): PROCALCITON, LATICACIDVEN in the last 168 hours.  Recent Results (from the past 240 hour(s))  SARS Coronavirus 2 by RT PCR (hospital order, performed in [facility] hospital lab) Nasopharyngeal Nasopharyngeal Swab     Status: None   Collection Time: 02/16/20  8:01 PM   Specimen: Nasopharyngeal Swab   Result Value Ref Range Status   SARS Coronavirus 2 NEGATIVE NEGATIVE Final    Comment: (NOTE) SARS-CoV-2 target nucleic acids are NOT DETECTED.  The SARS-CoV-2 RNA is generally detectable in upper and lower respiratory specimens during the acute phase of infection. The lowest concentration of SARS-CoV-2 viral copies this assay can detect is 250 copies / mL. A negative result does not preclude SARS-CoV-2 infection and should not be used as the sole basis for treatment or other patient management decisions.  A negative result may occur with improper specimen collection / handling, submission of specimen other than nasopharyngeal swab, presence of viral mutation(s) within the areas targeted by this assay, and inadequate number of viral copies (<250 copies / mL). A negative result must be combined with clinical observations, patient history, and epidemiological information.  Fact Sheet for Patients:   BoilerBrush.com.cy  Fact Sheet for Healthcare Providers: https://pope.com/  This test is not yet approved or  cleared by the [place] FDA and has been authorized for detection and/or diagnosis of SARS-CoV-2 by FDA under an Emergency Use Authorization (EUA).  This EUA will remain in effect (meaning this test can be used) for the duration of the COVID-19 declaration under Section 564(b)(1) of the Act, 21 U.S.C. section 360bbb-3(b)(1), unless the authorization is terminated or revoked sooner.  Performed at [facility], [address] [place], [place] [address]          Radiology Studies: DG CHEST PORT 1 VIEW  Result Date: 02/18/2020 CLINICAL DATA:  Shortness of breath EXAM: PORTABLE CHEST 1 VIEW COMPARISON:  February 16, 2020 FINDINGS: The cardiomediastinal silhouette is unchanged in contour with chronic elevation of LEFT hemidiaphragm.Atherosclerotic calcifications of the aorta. Unchanged LEFT-sided pleural blunting. No  pneumothorax. Homogeneous opacity of the LEFT lung base, unchanged in comparison to prior and likely reflecting scarring/atelectasis. Visualized abdomen is unremarkable. Remote bilateral rib fractures. IMPRESSION: Chronic elevation of the LEFT hemidiaphragm and LEFT basilar scarring/atelectasis. Superimposed infection remains in the differential. Electronically Signed   By: Meda Klinefelter MD   On: 02/18/2020 13:44   ECHOCARDIOGRAM COMPLETE  Result Date: 02/19/2020    ECHOCARDIOGRAM REPORT   Patient Name:   TRISTIAN RAIRIGH Date of Exam: 02/18/2020 Medical Rec #:  [id number]      Height:       66.0 in Accession #:    [id number]     Weight:       179.0 lb Date of Birth:  [birth year]     BSA:          1.908 m Patient Age:    64 years       BP:           130/89 mmHg Patient Gender: M              HR:           104 bpm. Exam Location:  Inpatient Procedure: 2D Echo, Cardiac Doppler, Color Doppler and Intracardiac            Opacification Agent Indications:    Dyspnea  History:        Patient has no prior history of Echocardiogram examinations.                 COPD, Signs/Symptoms:Shortness of Breath; Risk                 Factors:Hypertension.  Sonographer:    Ross Ludwig RDCS (AE) Referring Phys: [id number] [facility]  Sonographer Comments: Technically challenging study due to limited acoustic windows, Technically difficult study due to poor echo windows, suboptimal parasternal window, suboptimal apical window and suboptimal subcostal window. Image acquisition challenging due to patient body habitus, Image acquisition challenging due to COPD and Image acquisition challenging due to respiratory motion. IMPRESSIONS  1. Left ventricular ejection fraction, by estimation, is 65 to 70%. The left ventricle has normal function. The left ventricle has no regional wall motion abnormalities. There is mild left ventricular hypertrophy. Left ventricular diastolic parameters are consistent with Grade I diastolic dysfunction (impaired  relaxation).  2. Right ventricular systolic function is hyperdynamic. The right ventricular size is normal. Tricuspid regurgitation signal is inadequate for assessing PA pressure.  3. The mitral valve is grossly normal. Trivial mitral valve regurgitation.  4. The aortic valve was not well visualized. Aortic valve regurgitation is not visualized. Mild aortic valve sclerosis is  present, with no evidence of aortic valve stenosis.  5. The inferior vena cava is dilated in size with <50% respiratory variability, suggesting right atrial pressure of 15 mmHg. Comparison(s): No prior Echocardiogram. FINDINGS  Left Ventricle: Left ventricular ejection fraction, by estimation, is 65 to 70%. The left ventricle has normal function. The left ventricle has no regional wall motion abnormalities. Definity contrast agent was given IV to delineate the left ventricular  endocardial borders. The left ventricular internal cavity size was normal in size. There is mild left ventricular hypertrophy. Left ventricular diastolic parameters are consistent with Grade I diastolic dysfunction (impaired relaxation). Indeterminate filling pressures. Right Ventricle: The right ventricular size is normal. No increase in right ventricular wall thickness. Right ventricular systolic function is hyperdynamic. Tricuspid regurgitation signal is inadequate for assessing PA pressure. Left Atrium: Left atrial size was normal in size. Right Atrium: Right atrial size was normal in size. Pericardium: There is no evidence of pericardial effusion. Mitral Valve: The mitral valve is grossly normal. Trivial mitral valve regurgitation. Tricuspid Valve: The tricuspid valve is not well visualized. Tricuspid valve regurgitation is not demonstrated. Aortic Valve: The aortic valve was not well visualized. Aortic valve regurgitation is not visualized. Mild aortic valve sclerosis is present, with no evidence of aortic valve stenosis. Aortic valve mean gradient measures 3.5  mmHg. Aortic valve peak gradient measures 6.2 mmHg. Aortic valve area, by VTI measures 3.17 cm. Pulmonic Valve: The pulmonic valve was not well visualized. Pulmonic valve regurgitation is not visualized. Aorta: The aortic root and ascending aorta are structurally normal, with no evidence of dilitation. Venous: The inferior vena cava is dilated in size with less than 50% respiratory variability, suggesting right atrial pressure of 15 mmHg. IAS/Shunts: The interatrial septum was not well visualized.  LEFT VENTRICLE PLAX 2D LVIDd:         4.70 cm  Diastology LVIDs:         2.90 cm  LV e' medial:    10.40 cm/s LV PW:         1.20 cm  LV E/e' medial:  9.2 LV IVS:        1.10 cm  LV e' lateral:   9.68 cm/s LVOT diam:     2.20 cm  LV E/e' lateral: 9.9 LV SV:         68 LV SV Index:   36 LVOT Area:     3.80 cm  RIGHT VENTRICLE             IVC RV S prime:     28.10 cm/s  IVC diam: 2.20 cm TAPSE (M-mode): 4.5 cm LEFT ATRIUM             Index       RIGHT ATRIUM           Index LA diam:        3.30 cm 1.73 cm/m  RA Area:     13.70 cm LA Vol (A2C):   34.4 ml 18.03 ml/m RA Volume:   30.30 ml  15.88 ml/m LA Vol (A4C):   78.5 ml 41.15 ml/m LA Biplane Vol: 54.1 ml 28.36 ml/m  AORTIC VALVE AV Area (Vmax):    3.27 cm AV Area (Vmean):   3.02 cm AV Area (VTI):     3.17 cm AV Vmax:           124.50 cm/s AV Vmean:          92.100 cm/s AV VTI:  0.215 m AV Peak Grad:      6.2 mmHg AV Mean Grad:      3.5 mmHg LVOT Vmax:         107.00 cm/s LVOT Vmean:        73.200 cm/s LVOT VTI:          0.179 m LVOT/AV VTI ratio: 0.83  AORTA Ao Root diam: 3.80 cm Ao Asc diam:  3.70 cm MITRAL VALVE MV Area (PHT): 7.37 cm     SHUNTS MV Decel Time: 103 msec     Systemic VTI:  0.18 m MV E velocity: 95.50 cm/s   Systemic Diam: 2.20 cm MV A velocity: 123.00 cm/s MV E/A ratio:  0.78 Zoila ShutterKenneth Hilty MD Electronically signed by Zoila ShutterKenneth Hilty MD Signature Date/Time: 02/19/2020/8:10:19 AM    Final         Scheduled Meds: . amLODipine  5 mg  Oral q AM  . atorvastatin  10 mg Oral QHS  . buPROPion  300 mg Oral Q breakfast  . cholecalciferol  1,000 Units Oral Daily  . doxycycline  100 mg Oral Q12H  . enoxaparin (LOVENOX) injection  40 mg Subcutaneous Q24H  . FLUoxetine  20 mg Oral q AM  . fluticasone furoate-vilanterol  1 puff Inhalation Daily  . hydrochlorothiazide  25 mg Oral q AM  . ipratropium-albuterol  3 mL Inhalation Q6H  . losartan  100 mg Oral q AM  . methylPREDNISolone (SOLU-MEDROL) injection  40 mg Intravenous Q6H  . multivitamin with minerals  1 tablet Oral Q breakfast  . Oxcarbazepine  300 mg Oral BID  . pantoprazole  40 mg Oral Daily  . sodium chloride flush  3 mL Intravenous Q12H  . umeclidinium bromide  1 puff Inhalation Daily   Continuous Infusions: . sodium chloride       LOS: 3 days   Time spent= 30 minutes    Dorcas CarrowKuber Markos Theil, MD    If 7PM-7AM, please contact night-coverage  02/19/2020, 4:38 PM

## 2020-02-19 NOTE — Progress Notes (Signed)
Patient ambulated from room to nurse's station then to end of hall and  back to room. Oxygen saturation fluctuated from 88% to 94%. HR in 120's. Once back to room to resting, sats 94% HR 107. Patient denies any pain or discomfort.

## 2020-02-20 ENCOUNTER — Inpatient Hospital Stay (HOSPITAL_COMMUNITY): Payer: No Typology Code available for payment source

## 2020-02-20 DIAGNOSIS — M7989 Other specified soft tissue disorders: Secondary | ICD-10-CM

## 2020-02-20 MED ORDER — IPRATROPIUM-ALBUTEROL 0.5-2.5 (3) MG/3ML IN SOLN
3.0000 mL | Freq: Four times a day (QID) | RESPIRATORY_TRACT | Status: DC | PRN
  Administered 2020-02-20: 3 mL via RESPIRATORY_TRACT
  Filled 2020-02-20: qty 3

## 2020-02-20 MED ORDER — PREDNISONE 10 MG (21) PO TBPK: ORAL_TABLET | ORAL | 0 refills | Status: DC

## 2020-02-20 NOTE — Progress Notes (Signed)
SATURATION QUALIFICATIONS:   Patient Saturations on Room Air at Rest = 90%  Patient Saturations on [employer] while Ambulating = 85%  Patient Saturations on 2 Liters of oxygen while Ambulating = 95%  Please briefly explain why patient needs home oxygen: Pt attempted to walk with RN to nurses station on Room air and became short of breath, oxygen saturation dropping to 85%. Requiring 2L [place] with ambulation.

## 2020-02-20 NOTE — Discharge Summary (Signed)
Physician Discharge Summary  Tag Wurtz ZOX:[id number] DOB: May 03, 1955 DOA: 02/16/2020  PCP: [facility]  Admit date: 02/16/2020 Discharge date: 02/20/2020  Admitted From: Home Disposition: Home  Recommendations for Outpatient Follow-up:  1. Follow up with PCP in 1-2 weeks   Home Health: Not applicable Equipment/Devices: Oxygen 2 L/min  Discharge Condition: Stable CODE STATUS: Full code Diet recommendation: Low-salt diet  Discharge summary: Patient is a 65 y.o.gentlemanwith medical history significant forCOPD, HTN who complaints ofincreased shortness of breath over the last 2 days. He reported a productive cough with greenish sputum and wheezing. He has been using nebulizer breathing treatments at home over the past few days every few hours without noticeable improvement. Over the past 6 weeks he has been seen 3-4 times for similar episodes of shortness of breath.  Patient tested positive for COVID-19 on January 4 at the [place] but recovered without requirng hospitalization but was given a short course of steroids from the [place] which he recently completed.He denies any abdominal pain, nausea, vomiting, diarrhea, chest pain or palpitations. He does however, report noticeable swelling of his legs for the last 2 to 3 weeks. He denies any prolonged immobilization or recent travel. He has no history of DVT or PE.  ED Course: On arrival to the ED oxygen saturations were 89-90 % on room air with a drop to 88 % with ambulation.  But was placed on nasal cannula to maintain oxygen saturations 92-96 %.  # COPD with acute exacerbation: Probably exacerbated with recent COVID-19 infection. Patient was admitted to the hospital and treated with aggressive bronchodilator therapy, IV steroids and chest physiotherapy.  He received 5 days of doxycycline.  Did very good clinical recovery. D-dimer normal. CT scan 12/31 with no evidence of PE. Lower extremity duplexes 2/7 with no evidence of  DVT. Patient is on inhalational steroids, LABA and short acting bronchodilator at home. Adequately stabilized.  He was evaluated for ambulatory oxygen need after completing therapy and remained hypoxic on mobility improved with 2 L oxygen. Discharged home with oxygen 2 L/min. specially needing with exertion. Will prescribe medrol dose pack to complete therapy.  # Essential hypertension, chronic Continue home medications of Norvasc, lorsartan, and hydrochlorothiazide  # Anxiety/depression Patient is on multiple medications at home including Wellbutrin, Prozac and oxcarbazepine that is resumed. Uses trazodone at night for sleep. Symptoms are well controlled.   Patient is stable to be discharged today.   Discharge Diagnoses:  Principal Problem:   COPD with acute exacerbation [facility]) Active Problems:   Essential hypertension   Hypokalemia   COPD exacerbation [facility])    Discharge Instructions  Discharge Instructions    Call MD for:  difficulty breathing, headache or visual disturbances   Complete by: As directed    Diet - low sodium heart healthy   Complete by: As directed    Increase activity slowly   Complete by: As directed      Allergies as of 02/20/2020      Reactions   Penicillins Other (See Comments)   From childhood- reaction not recalled   Sulfamethoxazole-trimethoprim Swelling, Other (See Comments)   Tongue swells      Medication List    TAKE these medications   acetaminophen 500 MG tablet Commonly known as: TYLENOL Take 1,000 mg by mouth every 6 (six) hours as needed for mild pain or headache.   albuterol 108 (90 Base) MCG/ACT inhaler Commonly known as: VENTOLIN HFA Inhale 2 puffs into the lungs every 4 (four) hours as needed for  wheezing or shortness of breath.   amLODipine 5 MG tablet Commonly known as: NORVASC Take 5 mg by mouth in the morning.   atorvastatin 10 MG tablet Commonly known as: LIPITOR Take 10 mg by mouth at bedtime.   BC HEADACHE POWDER  PO Take 2 packets by mouth daily as needed (headaches).   budesonide-formoterol 160-4.5 MCG/ACT inhaler Commonly known as: SYMBICORT Inhale 2 puffs into the lungs 2 (two) times daily.   buPROPion 300 MG 24 hr tablet Commonly known as: WELLBUTRIN XL Take 300 mg by mouth daily with breakfast.   Combivent Respimat 20-100 MCG/ACT Aers respimat Generic drug: Ipratropium-Albuterol Inhale 1 puff into the lungs 4 (four) times daily as needed for wheezing or shortness of breath.   FLUoxetine 20 MG capsule Commonly known as: PROZAC Take 20 mg by mouth in the morning.   hydrochlorothiazide 25 MG tablet Commonly known as: HYDRODIURIL Take 25 mg by mouth in the morning.   losartan 100 MG tablet Commonly known as: COZAAR Take 100 mg by mouth in the morning.   METAMUCIL PO Take by mouth See admin instructions. Mix 1 tablespoonful of powder into 8 ounces of water and drink once a day as needed for constipation   omeprazole 20 MG capsule Commonly known as: PRILOSEC Take 20 mg by mouth daily before breakfast.   One-A-Day Mens 50+ Advantage Tabs Take 1 tablet by mouth daily with breakfast.   Oxcarbazepine 300 MG tablet Commonly known as: TRILEPTAL Take 300 mg by mouth 2 (two) times daily.   predniSONE 10 MG (21) Tbpk tablet Commonly known as: STERAPRED UNI-PAK 21 TAB Take tapering doses as prescribed in the packet   Spiriva HandiHaler 18 MCG inhalation capsule Generic drug: tiotropium Place 18 mcg into inhaler and inhale daily.   Vitamin D-3 25 MCG (1000 UT) Caps Take 1,000 Units by mouth daily.            Durable Medical Equipment  (From admission, onward)         Start     Ordered   02/20/20 1053  For home use only DME oxygen  Once       Comments: Patient Saturations on Room Air at Rest = 90%  Patient Saturations on [employer] while Ambulating = 85%  Patient Saturations on 2 Liters of oxygen while Ambulating = 95%  Please briefly explain why patient needs home  oxygen: Pt attempted to walk with RN to nurses station on Room air and became short of breath, oxygen saturation dropping to 85%. Requiring 2L [place] with ambulation.  Question Answer Comment  Length of Need Lifetime   Mode or (Route) Nasal cannula   Liters per Minute 2   Frequency Continuous (stationary and portable oxygen unit needed)   Oxygen conserving device Yes   Oxygen delivery system Gas      02/20/20 1053          Follow-up Information    [facility] Follow up in 1 week(s).   Specialty: General Practice Contact information: [address] [place] [address] [phone]              Allergies  Allergen Reactions  . Penicillins Other (See Comments)    From childhood- reaction not recalled  . Sulfamethoxazole-Trimethoprim Swelling and Other (See Comments)    Tongue swells    Consultations:  None.   Procedures/Studies: DG Chest 2 View  Result Date: 02/16/2020 CLINICAL DATA:  COPD exacerbation onset yesterday, audible wheezing and productive cough. EXAM: CHEST -  2 VIEW COMPARISON:  Chest radiograph January 25, 2020 FINDINGS: The heart size and mediastinal contours are partially obscured but appear unchanged. Unchanged elevation of the left hemidiaphragm with left lower lobe atelectasis versus scarring. No significant pleural effusion or visible pneumothorax. The visualized skeletal structures are unchanged. IMPRESSION: Similar chronic elevation left hemidiaphragm with atelectasis versus scarring in the left lung base. Electronically Signed   By: Maudry Mayhew MD   On: 02/16/2020 17:31   DG CHEST PORT 1 VIEW  Result Date: 02/18/2020 CLINICAL DATA:  Shortness of breath EXAM: PORTABLE CHEST 1 VIEW COMPARISON:  February 16, 2020 FINDINGS: The cardiomediastinal silhouette is unchanged in contour with chronic elevation of LEFT hemidiaphragm.Atherosclerotic calcifications of the aorta. Unchanged LEFT-sided pleural blunting. No pneumothorax. Homogeneous opacity  of the LEFT lung base, unchanged in comparison to prior and likely reflecting scarring/atelectasis. Visualized abdomen is unremarkable. Remote bilateral rib fractures. IMPRESSION: Chronic elevation of the LEFT hemidiaphragm and LEFT basilar scarring/atelectasis. Superimposed infection remains in the differential. Electronically Signed   By: Meda Klinefelter MD   On: 02/18/2020 13:44   DG Chest Portable 1 View  Result Date: 01/25/2020 CLINICAL DATA:  Shortness of breath, COVID EXAM: PORTABLE CHEST 1 VIEW COMPARISON:  01/06/2020 FINDINGS: Elevation of the left hemidiaphragm, stable. Left base atelectasis or scarring. Small left effusion suspected. Right lung clear. Heart is borderline in size. IMPRESSION: Chronic elevation of the left hemidiaphragm with left base atelectasis or scarring and small left effusion. Electronically Signed   By: Charlett Nose M.D.   On: 01/25/2020 20:19   ECHOCARDIOGRAM COMPLETE  Result Date: 02/19/2020    ECHOCARDIOGRAM REPORT   Patient Name:   RASTUS BORTON Date of Exam: 02/18/2020 Medical Rec #:  [id number]      Height:       66.0 in Accession #:    [id number]     Weight:       179.0 lb Date of Birth:  1955-09-18     BSA:          1.908 m Patient Age:    64 years       BP:           130/89 mmHg Patient Gender: M              HR:           104 bpm. Exam Location:  Inpatient Procedure: 2D Echo, Cardiac Doppler, Color Doppler and Intracardiac            Opacification Agent Indications:    Dyspnea  History:        Patient has no prior history of Echocardiogram examinations.                 COPD, Signs/Symptoms:Shortness of Breath; Risk                 Factors:Hypertension.  Sonographer:    Ross Ludwig RDCS (AE) Referring Phys: [id number] [facility]  Sonographer Comments: Technically challenging study due to limited acoustic windows, Technically difficult study due to poor echo windows, suboptimal parasternal window, suboptimal apical window and suboptimal subcostal window. Image  acquisition challenging due to patient body habitus, Image acquisition challenging due to COPD and Image acquisition challenging due to respiratory motion. IMPRESSIONS  1. Left ventricular ejection fraction, by estimation, is 65 to 70%. The left ventricle has normal function. The left ventricle has no regional wall motion abnormalities. There is mild left ventricular hypertrophy. Left ventricular diastolic parameters are consistent with Grade I diastolic  dysfunction (impaired relaxation).  2. Right ventricular systolic function is hyperdynamic. The right ventricular size is normal. Tricuspid regurgitation signal is inadequate for assessing PA pressure.  3. The mitral valve is grossly normal. Trivial mitral valve regurgitation.  4. The aortic valve was not well visualized. Aortic valve regurgitation is not visualized. Mild aortic valve sclerosis is present, with no evidence of aortic valve stenosis.  5. The inferior vena cava is dilated in size with <50% respiratory variability, suggesting right atrial pressure of 15 mmHg. Comparison(s): No prior Echocardiogram. FINDINGS  Left Ventricle: Left ventricular ejection fraction, by estimation, is 65 to 70%. The left ventricle has normal function. The left ventricle has no regional wall motion abnormalities. Definity contrast agent was given IV to delineate the left ventricular  endocardial borders. The left ventricular internal cavity size was normal in size. There is mild left ventricular hypertrophy. Left ventricular diastolic parameters are consistent with Grade I diastolic dysfunction (impaired relaxation). Indeterminate filling pressures. Right Ventricle: The right ventricular size is normal. No increase in right ventricular wall thickness. Right ventricular systolic function is hyperdynamic. Tricuspid regurgitation signal is inadequate for assessing PA pressure. Left Atrium: Left atrial size was normal in size. Right Atrium: Right atrial size was normal in size.  Pericardium: There is no evidence of pericardial effusion. Mitral Valve: The mitral valve is grossly normal. Trivial mitral valve regurgitation. Tricuspid Valve: The tricuspid valve is not well visualized. Tricuspid valve regurgitation is not demonstrated. Aortic Valve: The aortic valve was not well visualized. Aortic valve regurgitation is not visualized. Mild aortic valve sclerosis is present, with no evidence of aortic valve stenosis. Aortic valve mean gradient measures 3.5 mmHg. Aortic valve peak gradient measures 6.2 mmHg. Aortic valve area, by VTI measures 3.17 cm. Pulmonic Valve: The pulmonic valve was not well visualized. Pulmonic valve regurgitation is not visualized. Aorta: The aortic root and ascending aorta are structurally normal, with no evidence of dilitation. Venous: The inferior vena cava is dilated in size with less than 50% respiratory variability, suggesting right atrial pressure of 15 mmHg. IAS/Shunts: The interatrial septum was not well visualized.  LEFT VENTRICLE PLAX 2D LVIDd:         4.70 cm  Diastology LVIDs:         2.90 cm  LV e' medial:    10.40 cm/s LV PW:         1.20 cm  LV E/e' medial:  9.2 LV IVS:        1.10 cm  LV e' lateral:   9.68 cm/s LVOT diam:     2.20 cm  LV E/e' lateral: 9.9 LV SV:         68 LV SV Index:   36 LVOT Area:     3.80 cm  RIGHT VENTRICLE             IVC RV S prime:     28.10 cm/s  IVC diam: 2.20 cm TAPSE (M-mode): 4.5 cm LEFT ATRIUM             Index       RIGHT ATRIUM           Index LA diam:        3.30 cm 1.73 cm/m  RA Area:     13.70 cm LA Vol (A2C):   34.4 ml 18.03 ml/m RA Volume:   30.30 ml  15.88 ml/m LA Vol (A4C):   78.5 ml 41.15 ml/m LA Biplane Vol: 54.1 ml 28.36 ml/m  AORTIC VALVE  AV Area (Vmax):    3.27 cm AV Area (Vmean):   3.02 cm AV Area (VTI):     3.17 cm AV Vmax:           124.50 cm/s AV Vmean:          92.100 cm/s AV VTI:            0.215 m AV Peak Grad:      6.2 mmHg AV Mean Grad:      3.5 mmHg LVOT Vmax:         107.00 cm/s LVOT  Vmean:        73.200 cm/s LVOT VTI:          0.179 m LVOT/AV VTI ratio: 0.83  AORTA Ao Root diam: 3.80 cm Ao Asc diam:  3.70 cm MITRAL VALVE MV Area (PHT): 7.37 cm     SHUNTS MV Decel Time: 103 msec     Systemic VTI:  0.18 m MV E velocity: 95.50 cm/s   Systemic Diam: 2.20 cm MV A velocity: 123.00 cm/s MV E/A ratio:  0.78 Zoila Shutter MD Electronically signed by Zoila Shutter MD Signature Date/Time: 02/19/2020/8:10:19 AM    Final    VAS [place] LOWER EXTREMITY VENOUS (DVT)  Result Date: 02/20/2020  Lower Venous DVT Study Indications: Swelling.  Comparison Study: no prior Performing Technologist: Blanch Media RVS  Examination Guidelines: A complete evaluation includes B-mode imaging, spectral Doppler, color Doppler, and power Doppler as needed of all accessible portions of each vessel. Bilateral testing is considered an integral part of a complete examination. Limited examinations for reoccurring indications may be performed as noted. The reflux portion of the exam is performed with the patient in reverse Trendelenburg.  +---------+---------------+---------+-----------+----------+--------------+ RIGHT    CompressibilityPhasicitySpontaneityPropertiesThrombus Aging +---------+---------------+---------+-----------+----------+--------------+ CFV      Full           Yes      Yes                                 +---------+---------------+---------+-----------+----------+--------------+ SFJ      Full                                                        +---------+---------------+---------+-----------+----------+--------------+ FV Prox  Full                                                        +---------+---------------+---------+-----------+----------+--------------+ FV Mid   Full                                                        +---------+---------------+---------+-----------+----------+--------------+ FV DistalFull                                                         +---------+---------------+---------+-----------+----------+--------------+  PFV      Full                                                        +---------+---------------+---------+-----------+----------+--------------+ POP      Full           Yes      Yes                                 +---------+---------------+---------+-----------+----------+--------------+ PTV      Full                                                        +---------+---------------+---------+-----------+----------+--------------+ PERO     Full                                                        +---------+---------------+---------+-----------+----------+--------------+   +---------+---------------+---------+-----------+----------+--------------+ LEFT     CompressibilityPhasicitySpontaneityPropertiesThrombus Aging +---------+---------------+---------+-----------+----------+--------------+ CFV      Full           Yes      Yes                                 +---------+---------------+---------+-----------+----------+--------------+ SFJ      Full                                                        +---------+---------------+---------+-----------+----------+--------------+ FV Prox  Full                                                        +---------+---------------+---------+-----------+----------+--------------+ FV Mid   Full                                                        +---------+---------------+---------+-----------+----------+--------------+ FV DistalFull                                                        +---------+---------------+---------+-----------+----------+--------------+ PFV      Full                                                        +---------+---------------+---------+-----------+----------+--------------+  POP      Full           Yes      Yes                                  +---------+---------------+---------+-----------+----------+--------------+ PTV      Full                                                        +---------+---------------+---------+-----------+----------+--------------+ PERO     Full                                                        +---------+---------------+---------+-----------+----------+--------------+     Summary: BILATERAL: - No evidence of deep vein thrombosis seen in the lower extremities, bilaterally. - No evidence of superficial venous thrombosis in the lower extremities, bilaterally. -No evidence of popliteal cyst, bilaterally.   *See table(s) above for measurements and observations.    Preliminary    (Echo, Carotid, EGD, Colonoscopy, ERCP)    Subjective: Patient is seen and examined.  No overnight events.  His breathing and wheezing has mostly improved. Mobilized in the hallway and oxygen dropped to 85%.  He had some shortness of breath but mostly remained with well-controlled symptoms.   Discharge Exam: Vitals:   02/20/20 0816 02/20/20 0817  BP:    Pulse:    Resp:    Temp:    SpO2: 95% 98%   Vitals:   02/20/20 0452 02/20/20 0806 02/20/20 0816 02/20/20 0817  BP: (!) 132/97 (!) 142/105    Pulse: 100 71    Resp: 18 18    Temp: 98.2 F (36.8 C) 97.8 F (36.6 C)    TempSrc: Oral Oral    SpO2: 93% 93% 95% 98%  Weight: 87.9 kg     Height:        General: Pt is alert, awake, not in acute distress On room air at rest. 2 liters oxygen on exertion. Cardiovascular: RRR, S1/S2 +, no rubs, no gallops Respiratory: CTA bilaterally, no wheezing, no rhonchi Abdominal: Soft, NT, ND, bowel sounds + Extremities: 1+ bilateral pedal edema, no cyanosis    The results of significant diagnostics from this hospitalization (including imaging, microbiology, ancillary and laboratory) are listed below for reference.     Microbiology: Recent Results (from the past 240 hour(s))  SARS Coronavirus 2 by RT PCR (hospital  order[facility]) Nasopharyngeal Nasopharyngeal Swab     Status: None   Collection Time: 02/16/20  8:01 PM   Specimen: Nasopharyngeal Swab  Result Value Ref Range Status   SARS Coronavirus 2 NEGATIVE NEGATIVE Final    Comment: (NOTE) SARS-CoV-2 target nucleic acids are NOT DETECTED.  The SARS-CoV-2 RNA is generally detectable in upper and lower respiratory specimens during the acute phase of infection. The lowest concentration of SARS-CoV-2 viral copies this assay can detect is 250 copies / mL. A negative result does not preclude SARS-CoV-2 infection and should not be used as the sole basis for treatment or other patient management decisions.  A negative result may occur with  improper specimen collection / handling, submission of specimen other than nasopharyngeal swab, presence of viral mutation(s) within the areas targeted by this assay, and inadequate number of viral copies (<250 copies / mL). A negative result must be combined with clinical observations, patient history, and epidemiological information.  Fact Sheet for Patients:   BoilerBrush.com.cy  Fact Sheet for Healthcare Providers: https://pope.com/  This test is not yet approved or  cleared by the [place] FDA and has been authorized for detection and/or diagnosis of SARS-CoV-2 by FDA under an Emergency Use Authorization (EUA).  This EUA will remain in effect (meaning this test can be used) for the duration of the COVID-19 declaration under Section 564(b)(1) of the Act, 21 U.S.C. section 360bbb-3(b)(1), unless the authorization is terminated or revoked sooner.  Performed at [facility], [address], [place], [place] [address]      Labs: BNP (last 3 results) Recent Labs    12/14/19 1515  BNP 24.4   Basic Metabolic Panel: Recent Labs  Lab 02/16/20 1708 02/16/20 2202 02/17/20 0527  NA 136  --  137  K 3.4*  --  4.2  CL  98  --  100  CO2 25  --  24  GLUCOSE 114*  --  153*  BUN 16  --  19  CREATININE 0.80 0.83 0.89  CALCIUM 9.5  --  9.6   Liver Function Tests: No results for input(s): AST, ALT, ALKPHOS, BILITOT, PROT, ALBUMIN in the last 168 hours. No results for input(s): LIPASE, AMYLASE in the last 168 hours. No results for input(s): AMMONIA in the last 168 hours. CBC: Recent Labs  Lab 02/16/20 1708 02/16/20 2202 02/17/20 0527  WBC 9.0 7.9 7.6  HGB 13.9 14.5 14.0  HCT 42.6 41.5 42.2  MCV 85.4 84.0 85.6  PLT 246 260 263   Cardiac Enzymes: No results for input(s): CKTOTAL, CKMB, CKMBINDEX, TROPONINI in the last 168 hours. BNP: Invalid input(s): POCBNP CBG: No results for input(s): GLUCAP in the last 168 hours. D-Dimer No results for input(s): DDIMER in the last 72 hours. Hgb A1c No results for input(s): HGBA1C in the last 72 hours. Lipid Profile No results for input(s): CHOL, HDL, LDLCALC, TRIG, CHOLHDL, LDLDIRECT in the last 72 hours. Thyroid function studies No results for input(s): TSH, T4TOTAL, T3FREE, THYROIDAB in the last 72 hours.  Invalid input(s): FREET3 Anemia work up No results for input(s): VITAMINB12, FOLATE, FERRITIN, TIBC, IRON, RETICCTPCT in the last 72 hours. Urinalysis No results found for: COLORURINE, APPEARANCEUR, LABSPEC, PHURINE, GLUCOSEU, HGBUR, BILIRUBINUR, KETONESUR, PROTEINUR, UROBILINOGEN, NITRITE, LEUKOCYTESUR Sepsis Labs Invalid input(s): PROCALCITONIN,  WBC,  LACTICIDVEN Microbiology Recent Results (from the past 240 hour(s))  SARS Coronavirus 2 by RT PCR (hospital order, performed in [facility] hospital lab) Nasopharyngeal Nasopharyngeal Swab     Status: None   Collection Time: 02/16/20  8:01 PM   Specimen: Nasopharyngeal Swab  Result Value Ref Range Status   SARS Coronavirus 2 NEGATIVE NEGATIVE Final    Comment: (NOTE) SARS-CoV-2 target nucleic acids are NOT DETECTED.  The SARS-CoV-2 RNA is generally detectable in upper and lower respiratory  specimens during the acute phase of infection. The lowest concentration of SARS-CoV-2 viral copies this assay can detect is 250 copies / mL. A negative result does not preclude SARS-CoV-2 infection and should not be used as the sole basis for treatment or other patient management decisions.  A negative result may occur with improper specimen collection / handling, submission of specimen other than nasopharyngeal swab,  presence of viral mutation(s) within the areas targeted by this assay, and inadequate number of viral copies (<250 copies / mL). A negative result must be combined with clinical observations, patient history, and epidemiological information.  Fact Sheet for Patients:   BoilerBrush.com.cy  Fact Sheet for Healthcare Providers: https://pope.com/  This test is not yet approved or  cleared by the [place] FDA and has been authorized for detection and/or diagnosis of SARS-CoV-2 by FDA under an Emergency Use Authorization (EUA).  This EUA will remain in effect (meaning this test can be used) for the duration of the COVID-19 declaration under Section 564(b)(1) of the Act, 21 U.S.C. section 360bbb-3(b)(1), unless the authorization is terminated or revoked sooner.  Performed at [facility], [address] [place], [place] [address]      Time coordinating discharge:  40 minutes  SIGNED:   Dorcas Carrow, MD  [facility] 02/20/2020, 11:06 AM

## 2020-02-20 NOTE — Progress Notes (Signed)
Lower extremity venous has been completed.   Preliminary results in CV Proc.   Blanch Media 02/20/2020 10:21 AM

## 2020-02-20 NOTE — TOC Initial Note (Signed)
Transition of Care [facility]) - Initial/Assessment Note    Patient Details  Name: Tanner Chang MRN: [id number] Date of Birth: [birth year]  Transition of Care [facility]) CM/SW Contact:    Gala Lewandowsky, RN Phone Number: 02/20/2020, 11:53 AM  Clinical Narrative: Case Manager spoke with patient regarding home oxygen- he was open to any agency that can get him out quickly. We discussed his VA benefits and his Medicaid Benefit. Patient states can use the Medicaid Benefit because the [place] may be longer. Case Manager reached out to [facility] Social Worker regarding oxygen. The oxygen would be delayed due to did not meet the cut off time for ordering same day. Patient was fine with using his Medicaid benefit and referral was sent to [employer] for delivery. O2 will be delivered to the room prior to transition home.              Expected Discharge Plan: Home/Self Care Barriers to Discharge: No Barriers Identified   Patient Goals and CMS Choice Patient states their goals for this hospitalization and ongoing recovery are:: to return home   Choice offered to / list presented to : NA  Expected Discharge Plan and Services Expected Discharge Plan: Home/Self Care In-house Referral: NA   Post Acute Care Choice: Durable Medical Equipment Living arrangements for the past 2 months: Apartment Expected Discharge Date: 02/20/20               DME Arranged: Oxygen DME Agency: [employer] Date DME Agency Contacted: 02/20/20 Time DME Agency Contacted: 1100 Representative spoke with at DME Agency: [facility] HH Arranged: NA HH Agency: NA        Prior Living Arrangements/Services Living arrangements for the past 2 months: Apartment Lives with:: Self Patient language and need for interpreter reviewed:: Yes Do you feel safe going back to the place where you live?: Yes      Need for Family Participation in Patient Care: Yes (Comment) Care giver support system in place?: Yes (comment)   Criminal  Activity/Legal Involvement Pertinent to Current Situation/Hospitalization: No - Comment as needed  Activities of Daily Living Home Assistive Devices/Equipment: Blood pressure cuff,Other (Comment) ADL Screening (condition at time of admission) Patient's cognitive ability adequate to safely complete daily activities?: Yes Is the patient deaf or have difficulty hearing?: No Does the patient have difficulty seeing, even when wearing glasses/contacts?: No Does the patient have difficulty concentrating, remembering, or making decisions?: No Patient able to express need for assistance with ADLs?: No Does the patient have difficulty dressing or bathing?: No Independently performs ADLs?: No Communication: Independent Dressing (OT): Independent Grooming: Independent Feeding: Independent Bathing: Independent Toileting: Independent In/Out Bed: Independent Walks in Home: Independent Does the patient have difficulty walking or climbing stairs?: No Weakness of Legs: None Weakness of Arms/Hands: None  Permission Sought/Granted Permission sought to share information with : Family Designer, industrial/product granted to share information with : Yes, Verbal Permission Granted     Permission granted to share info w AGENCY: [employer]        Emotional Assessment Appearance:: Appears stated age Attitude/Demeanor/Rapport: Engaged Affect (typically observed): Appropriate Orientation: : Oriented to Situation,Oriented to  Time,Oriented to Place,Oriented to Self Alcohol / Substance Use: Not Applicable Psych Involvement: No (comment)  Admission diagnosis:  COPD exacerbation (HCC) [J44.1] Acute respiratory failure with hypoxia (HCC) [J96.01] Patient Active Problem List   Diagnosis Date Noted  . COPD with acute exacerbation (HCC) 02/16/2020  . Essential hypertension 02/16/2020  . Hypokalemia 02/16/2020  . COPD exacerbation (  HCC) 02/16/2020  . Coronary artery  calcification seen on CT scan 12/14/2019   PCP:  [facility]:   [facility] - [place], [place] - [address] [place] [address] Phone: [phone] Fax: [phone]  [id number] [place], [place] - [address] [place] [address] Phone: [phone] Fax: [phone]   Readmission Risk Interventions No flowsheet data found.

## 2020-03-20 ENCOUNTER — Inpatient Hospital Stay (HOSPITAL_COMMUNITY)
Admission: EM | Admit: 2020-03-20 | Discharge: 2020-03-26 | DRG: 291 | Disposition: A | Discharge status: Home / Self Care | Payer: No Typology Code available for payment source | Attending: Internal Medicine | Admitting: Internal Medicine

## 2020-03-20 ENCOUNTER — Emergency Department (HOSPITAL_COMMUNITY): Payer: No Typology Code available for payment source

## 2020-03-20 ENCOUNTER — Other Ambulatory Visit: Payer: Self-pay

## 2020-03-20 DIAGNOSIS — F32A Depression, unspecified: Secondary | ICD-10-CM | POA: Diagnosis present

## 2020-03-20 DIAGNOSIS — I110 Hypertensive heart disease with heart failure: Secondary | ICD-10-CM | POA: Diagnosis not present

## 2020-03-20 DIAGNOSIS — Z882 Allergy status to sulfonamides status: Secondary | ICD-10-CM

## 2020-03-20 DIAGNOSIS — I10 Essential (primary) hypertension: Secondary | ICD-10-CM | POA: Diagnosis present

## 2020-03-20 DIAGNOSIS — Z7951 Long term (current) use of inhaled steroids: Secondary | ICD-10-CM

## 2020-03-20 DIAGNOSIS — I5033 Acute on chronic diastolic (congestive) heart failure: Secondary | ICD-10-CM | POA: Diagnosis present

## 2020-03-20 DIAGNOSIS — E785 Hyperlipidemia, unspecified: Secondary | ICD-10-CM | POA: Diagnosis present

## 2020-03-20 DIAGNOSIS — J439 Emphysema, unspecified: Secondary | ICD-10-CM | POA: Diagnosis present

## 2020-03-20 DIAGNOSIS — I493 Ventricular premature depolarization: Secondary | ICD-10-CM | POA: Diagnosis present

## 2020-03-20 DIAGNOSIS — F419 Anxiety disorder, unspecified: Secondary | ICD-10-CM | POA: Diagnosis present

## 2020-03-20 DIAGNOSIS — Z79899 Other long term (current) drug therapy: Secondary | ICD-10-CM

## 2020-03-20 DIAGNOSIS — Z87891 Personal history of nicotine dependence: Secondary | ICD-10-CM

## 2020-03-20 DIAGNOSIS — J441 Chronic obstructive pulmonary disease with (acute) exacerbation: Secondary | ICD-10-CM

## 2020-03-20 DIAGNOSIS — Z20822 Contact with and (suspected) exposure to covid-19: Secondary | ICD-10-CM | POA: Diagnosis present

## 2020-03-20 DIAGNOSIS — Z880 Allergy status to penicillin: Secondary | ICD-10-CM

## 2020-03-20 DIAGNOSIS — E876 Hypokalemia: Secondary | ICD-10-CM | POA: Diagnosis not present

## 2020-03-20 DIAGNOSIS — J9621 Acute and chronic respiratory failure with hypoxia: Secondary | ICD-10-CM | POA: Diagnosis present

## 2020-03-20 DIAGNOSIS — R0602 Shortness of breath: Secondary | ICD-10-CM | POA: Diagnosis not present

## 2020-03-20 LAB — CBC WITH DIFFERENTIAL/PLATELET
Abs Immature Granulocytes: 0.08 10*3/uL — ABNORMAL HIGH (ref 0.00–0.07)
Basophils Absolute: 0.1 10*3/uL (ref 0.0–0.1)
Basophils Relative: 1 %
Eosinophils Absolute: 0.5 10*3/uL (ref 0.0–0.5)
Eosinophils Relative: 7 %
HCT: 37.7 % — ABNORMAL LOW (ref 39.0–52.0)
Hemoglobin: 12.5 g/dL — ABNORMAL LOW (ref 13.0–17.0)
Immature Granulocytes: 1 %
Lymphocytes Relative: 19 %
Lymphs Abs: 1.3 10*3/uL (ref 0.7–4.0)
MCH: 29.2 pg (ref 26.0–34.0)
MCHC: 33.2 g/dL (ref 30.0–36.0)
MCV: 88.1 fL (ref 80.0–100.0)
Monocytes Absolute: 0.8 10*3/uL (ref 0.1–1.0)
Monocytes Relative: 11 %
Neutro Abs: 4.4 10*3/uL (ref 1.7–7.7)
Neutrophils Relative %: 61 %
Platelets: 232 10*3/uL (ref 150–400)
RBC: 4.28 MIL/uL (ref 4.22–5.81)
RDW: 14.6 % (ref 11.5–15.5)
WBC: 7.2 10*3/uL (ref 4.0–10.5)
nRBC: 0 % (ref 0.0–0.2)

## 2020-03-20 LAB — COMPREHENSIVE METABOLIC PANEL
ALT: 28 U/L (ref 0–44)
AST: 25 U/L (ref 15–41)
Albumin: 3.8 g/dL (ref 3.5–5.0)
Alkaline Phosphatase: 56 U/L (ref 38–126)
Anion gap: 10 (ref 5–15)
BUN: 13 mg/dL (ref 8–23)
CO2: 26 mmol/L (ref 22–32)
Calcium: 9.6 mg/dL (ref 8.9–10.3)
Chloride: 99 mmol/L (ref 98–111)
Creatinine, Ser: 0.92 mg/dL (ref 0.61–1.24)
GFR, Estimated: >60 mL/min (ref >60)
Glucose, Bld: 102 mg/dL — ABNORMAL HIGH (ref 70–99)
Potassium: 3.9 mmol/L (ref 3.5–5.1)
Sodium: 135 mmol/L (ref 135–145)
Total Bilirubin: 0.9 mg/dL (ref 0.3–1.2)
Total Protein: 6.5 g/dL (ref 6.5–8.1)

## 2020-03-20 LAB — RESP PANEL BY RT-PCR (FLU A&B, COVID) ARPGX2
Influenza A by PCR: NEGATIVE
Influenza B by PCR: NEGATIVE
SARS Coronavirus 2 by RT PCR: NEGATIVE

## 2020-03-20 LAB — BRAIN NATRIURETIC PEPTIDE: B Natriuretic Peptide: 120.3 pg/mL — ABNORMAL HIGH (ref 0.0–100.0)

## 2020-03-20 MED ORDER — ALBUTEROL SULFATE HFA 108 (90 BASE) MCG/ACT IN AERS
4.0000 | INHALATION_SPRAY | Freq: Once | RESPIRATORY_TRACT | Status: AC
  Administered 2020-03-20: 4 via RESPIRATORY_TRACT
  Filled 2020-03-20: qty 6.7

## 2020-03-20 MED ORDER — METHYLPREDNISOLONE SODIUM SUCC 125 MG IJ SOLR
125.0000 mg | Freq: Once | INTRAMUSCULAR | Status: AC
  Administered 2020-03-20: 125 mg via INTRAVENOUS
  Filled 2020-03-20: qty 2

## 2020-03-20 NOTE — ED Provider Notes (Signed)
[facility] Provider Note   CSN: [id number] Arrival date & time: 03/20/20  1840     History Chief Complaint  Patient presents with   Shortness of Breath    Tanner Chang is a 65 y.o. male with past medical history significant for COPD and hypertension who presents the ED via EMS for wheezing.  I reviewed patient's medical record and he has been evaluated in the ER 4 times in the past couple of months for similar episodes and required a 5-day hospitalization during his most recent encounter on 02/16/2020.  He had a negative CTA in December 2021 and negative lower extremity Dopplers during his most recent admission.  Patient also had an echocardiogram obtained while inpatient which was without any reduced ejection fraction.  On my examination, patient is sitting upright in a chair with increased work of breathing and audible wheezing.  He states that his symptoms began approximately 1 week ago and have been getting progressively worse.  He states that he cannot walk more than 10 feet without significant limitation.  He has been using his at home albuterol nebulizer treatments, with little relief.  He denies any chest pain and states that he never experiences chest discomfort during these episodes.  He states that he is followed by [facility] and [facility] [place].  He feels as though he is not being adequately managed.  He continues to experience these episodes.  He has a 15-pack-year smoking history, but not active.  Patient suspects that his left-sided elevated hemidiaphragm was from trauma sustained 5 years ago in which he fell onto a pole sustaining injury to his chest wall.  Patient complains of significant bilateral extremity edema and orthopnea.  He states that he has a small wound on his left lower leg which he has been treating with hydrogen peroxide.  I advised him to discontinue that approach.  Patient denies any  fevers or chills, chest pain, abdominal pain, dizziness, nausea and emesis, or any other symptoms.  He states that he is supposed to be on 2 L supplemental oxygen as needed at home, but feels as though he needs more.  HPI     Past Medical History:  Diagnosis Date   COPD (chronic obstructive pulmonary disease) (HCC)    Hypertension     Patient Active Problem List   Diagnosis Date Noted   COPD with acute exacerbation (HCC) 02/16/2020   Essential hypertension 02/16/2020   Hypokalemia 02/16/2020   COPD exacerbation (HCC) 02/16/2020   Coronary artery calcification seen on CT scan 12/14/2019    No past surgical history on file.     No family history on file.  Social History   Tobacco Use   Smoking status: Former Smoker   Smokeless tobacco: Former Engineer, waterUser  Substance Use Topics   Alcohol use: Yes   Drug use: Not Currently    Home Medications Prior to Admission medications   Medication Sig Start Date End Date Taking? Authorizing Provider  acetaminophen (TYLENOL) 500 MG tablet Take 1,000 mg by mouth every 6 (six) hours as needed for mild pain or headache.    [provider]  albuterol (VENTOLIN HFA) 108 (90 Base) MCG/ACT inhaler Inhale 2 puffs into the lungs every 4 (four) hours as needed for wheezing or shortness of breath. 12/14/19   Milagros Lollykstra, Richard S, MD  amLODipine (NORVASC) 5 MG tablet Take 5 mg by mouth in the morning.    [provider]  Aspirin-Salicylamide-Caffeine (  BC HEADACHE POWDER PO) Take 2 packets by mouth daily as needed (headaches).    [provider]  atorvastatin (LIPITOR) 10 MG tablet Take 10 mg by mouth at bedtime.    [provider]  budesonide-formoterol (SYMBICORT) 160-4.5 MCG/ACT inhaler Inhale 2 puffs into the lungs 2 (two) times daily.    [provider]  buPROPion (WELLBUTRIN XL) 300 MG 24 hr tablet Take 300 mg by mouth daily with breakfast.    [provider]  Cholecalciferol (VITAMIN  D-3) 25 MCG (1000 UT) CAPS Take 1,000 Units by mouth daily.    [provider]  FLUoxetine (PROZAC) 20 MG capsule Take 20 mg by mouth in the morning.    [provider]  hydrochlorothiazide (HYDRODIURIL) 25 MG tablet Take 25 mg by mouth in the morning.    [provider]  Ipratropium-Albuterol (COMBIVENT RESPIMAT) 20-100 MCG/ACT AERS respimat Inhale 1 puff into the lungs 4 (four) times daily as needed for wheezing or shortness of breath.    [provider]  losartan (COZAAR) 100 MG tablet Take 100 mg by mouth in the morning.    [provider]  Multiple Vitamins-Minerals (ONE-A-DAY MENS 50+ ADVANTAGE) TABS Take 1 tablet by mouth daily with breakfast.    [provider]  omeprazole (PRILOSEC) 20 MG capsule Take 20 mg by mouth daily before breakfast.    [provider]  Oxcarbazepine (TRILEPTAL) 300 MG tablet Take 300 mg by mouth 2 (two) times daily.    [provider]  predniSONE (STERAPRED UNI-PAK 21 TAB) 10 MG (21) TBPK tablet Take tapering doses as prescribed in the packet 02/20/20   Dorcas Carrow, MD  Psyllium (METAMUCIL PO) Take by mouth See admin instructions. Mix 1 tablespoonful of powder into 8 ounces of water and drink once a day as needed for constipation    [provider]  tiotropium (SPIRIVA HANDIHALER) 18 MCG inhalation capsule Place 18 mcg into inhaler and inhale daily.    [provider]    Allergies    Penicillins and Sulfamethoxazole-trimethoprim  Review of Systems   Review of Systems  All other systems reviewed and are negative.   Physical Exam Updated Vital Signs BP (!) 149/93    Pulse 94    Temp 98.7 F (37.1 C) (Oral)    Resp (!) 22    SpO2 92%   Physical Exam Vitals and nursing note reviewed. Exam conducted with a chaperone present.  Constitutional:      Appearance: He is not toxic-appearing.     Comments: Sitting in chair, upright, increased work of breathing and audible  wheezing.  HENT:     Head: Normocephalic and atraumatic.  Eyes:     General: No scleral icterus.    Conjunctiva/sclera: Conjunctivae normal.  Cardiovascular:     Rate and Rhythm: Normal rate and regular rhythm.     Pulses: Normal pulses.  Pulmonary:     Effort: No respiratory distress.     Breath sounds: Wheezing present.     Comments: Patient with increased work of breathing.  No accessory muscle use.  Chest rises symmetric.  Significant wheezing auscultated throughout.  Low 90s on room air.  Minimally tachypneic.  No respiratory distress. Musculoskeletal:     Cervical back: Normal range of motion. No rigidity.     Right lower leg: Edema present.     Left lower leg: Edema present.     Comments: Significant lower extremity swelling and edema bilaterally with evidence of early venous stasis  dermatitis.  Peripheral pulses intact and symmetric.  Skin:    General: Skin is dry.  Neurological:     Mental Status: He is alert and oriented to person, place, and time.     GCS: GCS eye subscore is 4. GCS verbal subscore is 5. GCS motor subscore is 6.  Psychiatric:        Mood and Affect: Mood normal.        Behavior: Behavior normal.        Thought Content: Thought content normal.     ED Results / Procedures / Treatments   Labs (all labs ordered are listed, but only abnormal results are displayed) Labs Reviewed  CBC WITH DIFFERENTIAL/PLATELET - Abnormal; Notable for the following components:      Result Value   Hemoglobin 12.5 (*)    HCT 37.7 (*)    Abs Immature Granulocytes 0.08 (*)    All other components within normal limits  COMPREHENSIVE METABOLIC PANEL - Abnormal; Notable for the following components:   Glucose, Bld 102 (*)    All other components within normal limits  BRAIN NATRIURETIC PEPTIDE - Abnormal; Notable for the following components:   B Natriuretic Peptide 120.3 (*)    All other components within normal limits  RESP PANEL BY RT-PCR (FLU A&B, COVID) [id number]     EKG EKG Interpretation  Date/Time:  Tuesday March 20 2020 18:43:31 EST Ventricular Rate:  91 PR Interval:  148 QRS Duration: 82 QT Interval:  348 QTC Calculation: 428 R Axis:   45 Text Interpretation: Sinus rhythm with occasional Premature ventricular complexes Otherwise normal ECG Confirmed by Kristine Royal [phone]) on 03/20/2020 9:20:10 PM   Radiology DG Chest 2 View  Result Date: 03/20/2020 CLINICAL DATA:  Dyspnea on exertion for several months, COPD, hypertension, tobacco abuse EXAM: CHEST - 2 VIEW COMPARISON:  02/18/2020 FINDINGS: Frontal and lateral views of the chest demonstrate a stable cardiac silhouette. Chronic elevation of the left hemidiaphragm. Diffuse interstitial prominence consistent with given history of emphysema. No airspace disease, effusion, or pneumothorax. No acute bony abnormalities. IMPRESSION: 1. Stable emphysema.  No acute process. 2. Chronic elevation left hemidiaphragm. Electronically Signed   By: Sharlet Salina M.D.   On: 03/20/2020 19:22    Procedures Procedures   Medications Ordered in ED Medications  magnesium sulfate IVPB 2 g 50 mL (has no administration in time range)  ipratropium-albuterol (DUONEB) 0.5-2.5 (3) MG/3ML nebulizer solution 3 mL (has no administration in time range)  methylPREDNISolone sodium succinate (SOLU-MEDROL) 125 mg/2 mL injection 125 mg (125 mg Intravenous Given 03/20/20 2207)  albuterol (VENTOLIN HFA) 108 (90 Base) MCG/ACT inhaler 4 puff (4 puffs Inhalation Given 03/20/20 2335)    ED Course  I have reviewed the triage vital signs and the nursing notes.  Pertinent labs & imaging results that were available during my care of the patient were reviewed by me and considered in my medical decision making (see chart for details).    MDM Rules/Calculators/A&P                          Landynn Dupler was evaluated in Emergency Department on 03/21/2020 for the symptoms described in the history of present illness. He was evaluated in  the context of the global COVID-19 pandemic, which necessitated consideration that the patient might be at risk for infection with the SARS-CoV-2 virus that causes COVID-19. Institutional protocols and algorithms that pertain to the evaluation of patients at risk for COVID-19 are  in a state of rapid change based on information released by regulatory bodies including the CDC and federal and state organizations. These policies and algorithms were followed during the patient's care in the ED.  I personally reviewed patient's medical chart and all notes from triage and staff during today's encounter. I have also ordered and reviewed all labs and imaging that I felt to be medically necessary in the evaluation of this patient's complaints and with consideration of their physical exam. If needed, translation services were available and utilized.   Patient with significant dyspnea, with even mild exertion.  He has been 87% on RA at rest.  That oxygen saturation remains flat with mild exertion, but he becomes tachypneic and appears to be breathing hard.  After 125 mg IV Solu-Medrol, patient is still not feeling improved.  COVID-19 2-hour PCR test has been ordered and is still pending.  We will plan for continuous nebs treatments once that has resulted, but will provide 4 puffs albuterol with spacer here in the interim.    On subsequent evaluation, patient is feeling a little bit improved after his IV Solu-Medrol and 4 puffs albuterol with spacer, however he still is sitting upright in the chair, uncomfortable, and feeling short of breath despite being placed on his 2 L submental O2 via [place].  On reauscultation, he continues to have significant wheezing bilaterally.  Will treat with IV magnesium sulfate and plan to admit to the hospitalist for COPD exacerbation.  He states that he has not slept in 2 days due to his dyspnea.   At shift change care was transferred to [facility], PA-C who will follow pending studies,  re-evaluate, and determine disposition.     Final Clinical Impression(s) / ED Diagnoses Final diagnoses:  COPD exacerbation [facility])    Rx / DC Orders ED Discharge Orders    None       Lorelee New, PA-C 03/21/20 0017    Wynetta Fines, MD 03/23/20 1017

## 2020-03-20 NOTE — ED Provider Notes (Incomplete)
[facility] EMERGENCY DEPARTMENT Provider Note   CSN: [id number] Arrival date & time: 03/20/20  1840     History Chief Complaint  Patient presents with  . Shortness of Breath    Tanner Chang is a 65 y.o. male with past medical history significant for COPD and hypertension who presents the ED via EMS for wheezing.  I reviewed patient's medical record and he has been evaluated in the ER 4 times in the past couple of months for similar episodes and required a 5-day hospitalization during his most recent encounter on 02/16/2020.  He had a negative CTA in December 2021 and negative lower extremity Dopplers during his most recent admission.  Patient also had an echocardiogram obtained while inpatient which was without any reduced ejection fraction.  On my examination, patient is sitting upright in a chair with increased work of breathing and audible wheezing.  He states that his symptoms began approximately 1 week ago and have been getting progressively worse.  He states that he cannot walk more than 10 feet without significant limitation.  He has been using his at home albuterol nebulizer treatments, with little relief.  He denies any chest pain and states that he never experiences chest discomfort during these episodes.  He states that he is followed by primary care provider and pulmonologist at the [facility] in [place].  He feels as though he is not being adequately managed.  He continues to experience these episodes.  He has a 15-pack-year smoking history, but not active.  Patient suspects that his left-sided elevated hemidiaphragm was from trauma sustained 5 years ago in which he fell onto a pole sustaining injury to his chest wall.  Patient complains of significant bilateral extremity edema and orthopnea.  He states that he has a small wound on his left lower leg which he has been treating with hydrogen peroxide.  I advised him to discontinue that approach.  Patient denies any  fevers or chills, chest pain, abdominal pain, dizziness, nausea and emesis, or any other symptoms.  He states that he is supposed to be on 2 L supplemental oxygen as needed at home, but feels as though he needs more.  HPI     Past Medical History:  Diagnosis Date  . COPD (chronic obstructive pulmonary disease) (HCC)   . Hypertension     Patient Active Problem List   Diagnosis Date Noted  . COPD with acute exacerbation (HCC) 02/16/2020  . Essential hypertension 02/16/2020  . Hypokalemia 02/16/2020  . COPD exacerbation (HCC) 02/16/2020  . Coronary artery calcification seen on CT scan 12/14/2019    No past surgical history on file.     No family history on file.  Social History   Tobacco Use  . Smoking status: Former Games developer  . Smokeless tobacco: Former Engineer, water Use Topics  . Alcohol use: Yes  . Drug use: Not Currently    Home Medications Prior to Admission medications   Medication Sig Start Date End Date Taking? Authorizing Provider  acetaminophen (TYLENOL) 500 MG tablet Take 1,000 mg by mouth every 6 (six) hours as needed for mild pain or headache.    [provider]  albuterol (VENTOLIN HFA) 108 (90 Base) MCG/ACT inhaler Inhale 2 puffs into the lungs every 4 (four) hours as needed for wheezing or shortness of breath. 12/14/19   Milagros Loll, MD  amLODipine (NORVASC) 5 MG tablet Take 5 mg by mouth in the morning.    [provider]  Aspirin-Salicylamide-Caffeine (  BC HEADACHE POWDER PO) Take 2 packets by mouth daily as needed (headaches).    [provider]  atorvastatin (LIPITOR) 10 MG tablet Take 10 mg by mouth at bedtime.    [provider]  budesonide-formoterol (SYMBICORT) 160-4.5 MCG/ACT inhaler Inhale 2 puffs into the lungs 2 (two) times daily.    [provider]  buPROPion (WELLBUTRIN XL) 300 MG 24 hr tablet Take 300 mg by mouth daily with breakfast.    [provider]  Cholecalciferol (VITAMIN  D-3) 25 MCG (1000 UT) CAPS Take 1,000 Units by mouth daily.    [provider]  FLUoxetine (PROZAC) 20 MG capsule Take 20 mg by mouth in the morning.    [provider]  hydrochlorothiazide (HYDRODIURIL) 25 MG tablet Take 25 mg by mouth in the morning.    [provider]  Ipratropium-Albuterol (COMBIVENT RESPIMAT) 20-100 MCG/ACT AERS respimat Inhale 1 puff into the lungs 4 (four) times daily as needed for wheezing or shortness of breath.    [provider]  losartan (COZAAR) 100 MG tablet Take 100 mg by mouth in the morning.    [provider]  Multiple Vitamins-Minerals (ONE-A-DAY MENS 50+ ADVANTAGE) TABS Take 1 tablet by mouth daily with breakfast.    [provider]  omeprazole (PRILOSEC) 20 MG capsule Take 20 mg by mouth daily before breakfast.    [provider]  Oxcarbazepine (TRILEPTAL) 300 MG tablet Take 300 mg by mouth 2 (two) times daily.    [provider]  predniSONE (STERAPRED UNI-PAK 21 TAB) 10 MG (21) TBPK tablet Take tapering doses as prescribed in the packet 02/20/20   Dorcas CarrowGhimire, Kuber, MD  Psyllium (METAMUCIL PO) Take by mouth See admin instructions. Mix 1 tablespoonful of powder into 8 ounces of water and drink once a day as needed for constipation    [provider]  tiotropium (SPIRIVA HANDIHALER) 18 MCG inhalation capsule Place 18 mcg into inhaler and inhale daily.    [provider]    Allergies    Penicillins and Sulfamethoxazole-trimethoprim  Review of Systems   Review of Systems  All other systems reviewed and are negative.   Physical Exam Updated Vital Signs BP (!) 149/93   Pulse 94   Temp 98.7 F (37.1 C) (Oral)   Resp (!) 22   SpO2 92%   Physical Exam Vitals and nursing note reviewed. Exam conducted with a chaperone present.  Constitutional:      Appearance: He is not toxic-appearing.     Comments: Sitting in chair, upright, increased work of breathing and audible  wheezing.  HENT:     Head: Normocephalic and atraumatic.  Eyes:     General: No scleral icterus.    Conjunctiva/sclera: Conjunctivae normal.  Cardiovascular:     Rate and Rhythm: Normal rate and regular rhythm.     Pulses: Normal pulses.  Pulmonary:     Effort: No respiratory distress.     Breath sounds: Wheezing present.     Comments: Patient with increased work of breathing.  No accessory muscle use.  Chest rises symmetric.  Significant wheezing auscultated throughout.  Low 90s on room air.  Minimally tachypneic.  No respiratory distress. Musculoskeletal:     Cervical back: Normal range of motion. No rigidity.     Right lower leg: Edema present.     Left lower leg: Edema present.     Comments: Significant lower extremity swelling and edema bilaterally with evidence of early venous stasis dermatitis.  Peripheral pulses  intact and symmetric.  Skin:    General: Skin is dry.  Neurological:     Mental Status: He is alert and oriented to person, place, and time.     GCS: GCS eye subscore is 4. GCS verbal subscore is 5. GCS motor subscore is 6.  Psychiatric:        Mood and Affect: Mood normal.        Behavior: Behavior normal.        Thought Content: Thought content normal.     ED Results / Procedures / Treatments   Labs (all labs ordered are listed, but only abnormal results are displayed) Labs Reviewed  CBC WITH DIFFERENTIAL/PLATELET - Abnormal; Notable for the following components:      Result Value   Hemoglobin 12.5 (*)    HCT 37.7 (*)    Abs Immature Granulocytes 0.08 (*)    All other components within normal limits  RESP PANEL BY RT-PCR (FLU A&B, COVID) [id number]  COMPREHENSIVE METABOLIC PANEL  BRAIN NATRIURETIC PEPTIDE    EKG EKG Interpretation  Date/Time:  Tuesday March 20 2020 18:43:31 EST Ventricular Rate:  91 PR Interval:  148 QRS Duration: 82 QT Interval:  348 QTC Calculation: 428 R Axis:   45 Text Interpretation: Sinus rhythm with occasional Premature  ventricular complexes Otherwise normal ECG Confirmed by Kristine Royal [phone]) on 03/20/2020 9:20:10 PM   Radiology DG Chest 2 View  Result Date: 03/20/2020 CLINICAL DATA:  Dyspnea on exertion for several months, COPD, hypertension, tobacco abuse EXAM: CHEST - 2 VIEW COMPARISON:  02/18/2020 FINDINGS: Frontal and lateral views of the chest demonstrate a stable cardiac silhouette. Chronic elevation of the left hemidiaphragm. Diffuse interstitial prominence consistent with given history of emphysema. No airspace disease, effusion, or pneumothorax. No acute bony abnormalities. IMPRESSION: 1. Stable emphysema.  No acute process. 2. Chronic elevation left hemidiaphragm. Electronically Signed   By: Sharlet Salina M.D.   On: 03/20/2020 19:22    Procedures Procedures {Remember to document critical care time when appropriate:1}  Medications Ordered in ED Medications  albuterol (VENTOLIN HFA) 108 (90 Base) MCG/ACT inhaler 4 puff (has no administration in time range)  methylPREDNISolone sodium succinate (SOLU-MEDROL) 125 mg/2 mL injection 125 mg (125 mg Intravenous Given 03/20/20 2207)    ED Course  I have reviewed the triage vital signs and the nursing notes.  Pertinent labs & imaging results that were available during my care of the patient were reviewed by me and considered in my medical decision making (see chart for details).    MDM Rules/Calculators/A&P                          Daril Warga was evaluated in Emergency Department on 03/20/2020 for the symptoms described in the history of present illness. He was evaluated in the context of the global COVID-19 pandemic, which necessitated consideration that the patient might be at risk for infection with the SARS-CoV-2 virus that causes COVID-19. Institutional protocols and algorithms that pertain to the evaluation of patients at risk for COVID-19 are in a state of rapid change based on information released by regulatory bodies including the CDC and  federal and state organizations. These policies and algorithms were followed during the patient's care in the ED.  I personally reviewed patient's medical chart and all notes from triage and staff during today's encounter. I have also ordered and reviewed all labs and imaging that I felt to be medically necessary in the evaluation of  this patient's complaints and with consideration of their physical exam. If needed, translation services were available and utilized.   Patient with significant dyspnea, with even mild exertion.  He has been 87% on RA at rest.  That oxygen saturation remains flat with mild exertion, but he becomes tachypneic and appears to be breathing hard.  After 125 mg IV Solu-Medrol, patient is still not feeling improved.  COVID-19 2-hour PCR test has been ordered and is still pending.  We will plan for continuous nebs treatments once that has resulted, but will provide 4 puffs albuterol with spacer here in the interim.  Patient may ultimately require magnesium sulfate, but I believe that he would benefit from admission for COPD exacerbation.  Will hospitalist given recent admission.     Final Clinical Impression(s) / ED Diagnoses Final diagnoses:  None    Rx / DC Orders ED Discharge Orders    None

## 2020-03-20 NOTE — ED Triage Notes (Signed)
Arrived via EMS; c/o shortness of breathe; EMS endorsed + wheezing and patient utilized home ned prior to arrival.

## 2020-03-20 NOTE — ED Notes (Signed)
Pt placed on 2L O2 due to pts O2 being less than 94%.

## 2020-03-21 ENCOUNTER — Encounter (HOSPITAL_COMMUNITY): Payer: Self-pay | Admitting: Family Medicine

## 2020-03-21 DIAGNOSIS — J439 Emphysema, unspecified: Secondary | ICD-10-CM | POA: Diagnosis present

## 2020-03-21 DIAGNOSIS — J441 Chronic obstructive pulmonary disease with (acute) exacerbation: Secondary | ICD-10-CM

## 2020-03-21 DIAGNOSIS — J9621 Acute and chronic respiratory failure with hypoxia: Secondary | ICD-10-CM | POA: Diagnosis present

## 2020-03-21 DIAGNOSIS — Z7951 Long term (current) use of inhaled steroids: Secondary | ICD-10-CM | POA: Diagnosis not present

## 2020-03-21 DIAGNOSIS — F32A Depression, unspecified: Secondary | ICD-10-CM | POA: Diagnosis present

## 2020-03-21 DIAGNOSIS — F419 Anxiety disorder, unspecified: Secondary | ICD-10-CM

## 2020-03-21 DIAGNOSIS — Z882 Allergy status to sulfonamides status: Secondary | ICD-10-CM | POA: Diagnosis not present

## 2020-03-21 DIAGNOSIS — Z20822 Contact with and (suspected) exposure to covid-19: Secondary | ICD-10-CM | POA: Diagnosis present

## 2020-03-21 DIAGNOSIS — E785 Hyperlipidemia, unspecified: Secondary | ICD-10-CM | POA: Diagnosis present

## 2020-03-21 DIAGNOSIS — I10 Essential (primary) hypertension: Secondary | ICD-10-CM | POA: Diagnosis not present

## 2020-03-21 DIAGNOSIS — I5033 Acute on chronic diastolic (congestive) heart failure: Secondary | ICD-10-CM | POA: Diagnosis present

## 2020-03-21 DIAGNOSIS — Z87891 Personal history of nicotine dependence: Secondary | ICD-10-CM | POA: Diagnosis not present

## 2020-03-21 DIAGNOSIS — I110 Hypertensive heart disease with heart failure: Secondary | ICD-10-CM | POA: Diagnosis present

## 2020-03-21 DIAGNOSIS — R0602 Shortness of breath: Secondary | ICD-10-CM | POA: Diagnosis present

## 2020-03-21 DIAGNOSIS — Z880 Allergy status to penicillin: Secondary | ICD-10-CM | POA: Diagnosis not present

## 2020-03-21 DIAGNOSIS — E876 Hypokalemia: Secondary | ICD-10-CM | POA: Diagnosis not present

## 2020-03-21 DIAGNOSIS — Z79899 Other long term (current) drug therapy: Secondary | ICD-10-CM | POA: Diagnosis not present

## 2020-03-21 DIAGNOSIS — I493 Ventricular premature depolarization: Secondary | ICD-10-CM | POA: Diagnosis present

## 2020-03-21 LAB — BASIC METABOLIC PANEL
Anion gap: 13 (ref 5–15)
BUN: 14 mg/dL (ref 8–23)
CO2: 24 mmol/L (ref 22–32)
Calcium: 9.7 mg/dL (ref 8.9–10.3)
Chloride: 97 mmol/L — ABNORMAL LOW (ref 98–111)
Creatinine, Ser: 0.81 mg/dL (ref 0.61–1.24)
GFR, Estimated: >60 mL/min (ref >60)
Glucose, Bld: 145 mg/dL — ABNORMAL HIGH (ref 70–99)
Potassium: 4.5 mmol/L (ref 3.5–5.1)
Sodium: 134 mmol/L — ABNORMAL LOW (ref 135–145)

## 2020-03-21 LAB — CBC
HCT: 37.5 % — ABNORMAL LOW (ref 39.0–52.0)
Hemoglobin: 12.7 g/dL — ABNORMAL LOW (ref 13.0–17.0)
MCH: 29.1 pg (ref 26.0–34.0)
MCHC: 33.9 g/dL (ref 30.0–36.0)
MCV: 85.8 fL (ref 80.0–100.0)
Platelets: 254 10*3/uL (ref 150–400)
RBC: 4.37 MIL/uL (ref 4.22–5.81)
RDW: 14.6 % (ref 11.5–15.5)
WBC: 6.2 10*3/uL (ref 4.0–10.5)
nRBC: 0 % (ref 0.0–0.2)

## 2020-03-21 LAB — GLUCOSE, CAPILLARY: Glucose-Capillary: 152 mg/dL — ABNORMAL HIGH (ref 70–99)

## 2020-03-21 MED ORDER — ALBUTEROL SULFATE HFA 108 (90 BASE) MCG/ACT IN AERS: 2.0000 | INHALATION_SPRAY | RESPIRATORY_TRACT | Status: DC | PRN

## 2020-03-21 MED ORDER — ATORVASTATIN CALCIUM 10 MG PO TABS
10.0000 mg | ORAL_TABLET | Freq: Every day | ORAL | Status: DC
  Administered 2020-03-21 – 2020-03-25 (×5): 10 mg via ORAL
  Filled 2020-03-21 (×5): qty 1

## 2020-03-21 MED ORDER — FUROSEMIDE 10 MG/ML IJ SOLN
40.0000 mg | Freq: Two times a day (BID) | INTRAMUSCULAR | Status: DC
  Administered 2020-03-21 – 2020-03-25 (×9): 40 mg via INTRAVENOUS
  Filled 2020-03-21 (×9): qty 4

## 2020-03-21 MED ORDER — IPRATROPIUM-ALBUTEROL 0.5-2.5 (3) MG/3ML IN SOLN: 3.0000 mL | Freq: Once | RESPIRATORY_TRACT | Status: DC

## 2020-03-21 MED ORDER — IPRATROPIUM BROMIDE 0.02 % IN SOLN
0.5000 mg | Freq: Once | RESPIRATORY_TRACT | Status: AC
  Administered 2020-03-21: 0.5 mg via RESPIRATORY_TRACT
  Filled 2020-03-21: qty 2.5

## 2020-03-21 MED ORDER — MAGNESIUM SULFATE 2 GM/50ML IV SOLN
2.0000 g | Freq: Once | INTRAVENOUS | Status: AC
  Administered 2020-03-21: 2 g via INTRAVENOUS
  Filled 2020-03-21: qty 50

## 2020-03-21 MED ORDER — SODIUM CHLORIDE 0.9 % IV SOLN: 250.0000 mL | INTRAVENOUS | Status: DC | PRN

## 2020-03-21 MED ORDER — METHYLPREDNISOLONE SODIUM SUCC 125 MG IJ SOLR
60.0000 mg | Freq: Four times a day (QID) | INTRAMUSCULAR | Status: AC
  Administered 2020-03-21 (×4): 60 mg via INTRAVENOUS
  Filled 2020-03-21 (×4): qty 2

## 2020-03-21 MED ORDER — AZITHROMYCIN 250 MG PO TABS
500.0000 mg | ORAL_TABLET | Freq: Every day | ORAL | Status: AC
  Administered 2020-03-21: 500 mg via ORAL
  Filled 2020-03-21: qty 2

## 2020-03-21 MED ORDER — ONDANSETRON HCL 4 MG PO TABS: 4.0000 mg | ORAL_TABLET | Freq: Four times a day (QID) | ORAL | Status: DC | PRN

## 2020-03-21 MED ORDER — IPRATROPIUM-ALBUTEROL 20-100 MCG/ACT IN AERS
2.0000 | INHALATION_SPRAY | Freq: Every day | RESPIRATORY_TRACT | Status: DC
  Administered 2020-03-24 – 2020-03-26 (×3): 2 via RESPIRATORY_TRACT
  Filled 2020-03-21 (×2): qty 4

## 2020-03-21 MED ORDER — ONDANSETRON HCL 4 MG/2ML IJ SOLN: 4.0000 mg | Freq: Four times a day (QID) | INTRAMUSCULAR | Status: DC | PRN

## 2020-03-21 MED ORDER — ACETAMINOPHEN 650 MG RE SUPP: 650.0000 mg | Freq: Four times a day (QID) | RECTAL | Status: DC | PRN

## 2020-03-21 MED ORDER — ENOXAPARIN SODIUM 40 MG/0.4ML ~~LOC~~ SOLN
40.0000 mg | SUBCUTANEOUS | Status: DC
  Administered 2020-03-22 – 2020-03-25 (×4): 40 mg via SUBCUTANEOUS
  Filled 2020-03-21 (×4): qty 0.4

## 2020-03-21 MED ORDER — PREDNISONE 20 MG PO TABS
40.0000 mg | ORAL_TABLET | Freq: Every day | ORAL | Status: AC
  Administered 2020-03-22 – 2020-03-25 (×4): 40 mg via ORAL
  Filled 2020-03-21 (×4): qty 2

## 2020-03-21 MED ORDER — FLUOXETINE HCL 20 MG PO CAPS
20.0000 mg | ORAL_CAPSULE | Freq: Every day | ORAL | Status: DC
  Administered 2020-03-21 – 2020-03-26 (×6): 20 mg via ORAL
  Filled 2020-03-21 (×6): qty 1

## 2020-03-21 MED ORDER — AMLODIPINE BESYLATE 5 MG PO TABS
5.0000 mg | ORAL_TABLET | Freq: Every day | ORAL | Status: DC
  Administered 2020-03-21: 5 mg via ORAL
  Filled 2020-03-21: qty 1

## 2020-03-21 MED ORDER — BUPROPION HCL ER (XL) 150 MG PO TB24
300.0000 mg | ORAL_TABLET | Freq: Every day | ORAL | Status: DC
  Administered 2020-03-21 – 2020-03-26 (×6): 300 mg via ORAL
  Filled 2020-03-21 (×6): qty 2

## 2020-03-21 MED ORDER — POLYETHYLENE GLYCOL 3350 17 G PO PACK: 17.0000 g | PACK | Freq: Every day | ORAL | Status: DC | PRN

## 2020-03-21 MED ORDER — ALBUTEROL SULFATE (2.5 MG/3ML) 0.083% IN NEBU
5.0000 mg | INHALATION_SOLUTION | Freq: Once | RESPIRATORY_TRACT | Status: AC
  Administered 2020-03-21: 5 mg via RESPIRATORY_TRACT
  Filled 2020-03-21: qty 6

## 2020-03-21 MED ORDER — OXCARBAZEPINE 300 MG PO TABS
300.0000 mg | ORAL_TABLET | Freq: Two times a day (BID) | ORAL | Status: DC
  Administered 2020-03-21 – 2020-03-26 (×11): 300 mg via ORAL
  Filled 2020-03-21 (×12): qty 1

## 2020-03-21 MED ORDER — IPRATROPIUM-ALBUTEROL 0.5-2.5 (3) MG/3ML IN SOLN
RESPIRATORY_TRACT | Status: AC
  Administered 2020-03-21: 3 mL via RESPIRATORY_TRACT
  Filled 2020-03-21: qty 3

## 2020-03-21 MED ORDER — MOMETASONE FURO-FORMOTEROL FUM 200-5 MCG/ACT IN AERO
2.0000 | INHALATION_SPRAY | Freq: Two times a day (BID) | RESPIRATORY_TRACT | Status: DC
  Administered 2020-03-21 – 2020-03-26 (×10): 2 via RESPIRATORY_TRACT
  Filled 2020-03-21: qty 8.8

## 2020-03-21 MED ORDER — PANTOPRAZOLE SODIUM 40 MG PO TBEC
40.0000 mg | DELAYED_RELEASE_TABLET | Freq: Every day | ORAL | Status: DC
  Administered 2020-03-21 – 2020-03-26 (×6): 40 mg via ORAL
  Filled 2020-03-21 (×6): qty 1

## 2020-03-21 MED ORDER — AZITHROMYCIN 250 MG PO TABS
250.0000 mg | ORAL_TABLET | Freq: Every day | ORAL | Status: AC
  Administered 2020-03-22 – 2020-03-25 (×4): 250 mg via ORAL
  Filled 2020-03-21 (×4): qty 1

## 2020-03-21 MED ORDER — FUROSEMIDE 10 MG/ML IJ SOLN
20.0000 mg | Freq: Once | INTRAMUSCULAR | Status: AC
  Administered 2020-03-21: 20 mg via INTRAVENOUS
  Filled 2020-03-21: qty 2

## 2020-03-21 MED ORDER — LOSARTAN POTASSIUM 50 MG PO TABS
100.0000 mg | ORAL_TABLET | Freq: Every day | ORAL | Status: DC
  Administered 2020-03-21 – 2020-03-26 (×6): 100 mg via ORAL
  Filled 2020-03-21 (×6): qty 2

## 2020-03-21 MED ORDER — ACETAMINOPHEN 325 MG PO TABS
650.0000 mg | ORAL_TABLET | Freq: Four times a day (QID) | ORAL | Status: DC | PRN
  Administered 2020-03-21: 650 mg via ORAL
  Filled 2020-03-21: qty 2

## 2020-03-21 NOTE — ED Notes (Signed)
Ambulated pt on 2L [place]. Pt spo2 maintained at 92%. Pt denies feeling SOB

## 2020-03-21 NOTE — ED Notes (Signed)
Called RT to see pt and let them know about orders.

## 2020-03-21 NOTE — Progress Notes (Signed)
  PROGRESS NOTE  Patient admitted earlier this morning. See H&P.   Tanner Chang is a 65 y.o. male with medical history significant for COPD, chronic hypoxic respiratory failure using supplemental O2 at home on a PRN basis, hypertension, anxiety, depression, and chronic diastolic CHF, now presenting to the emergency department for evaluation of worsening shortness of breath and wheezing. Patient was admitted 1 month ago with similar symptoms, never felt that he returned to baseline in terms of shortness of breath but was able to perform all his usual activities without limitation until approximately 1 week ago when his shortness of breath began to worsen. He is also having increased wheezing. He feels that he needs to expectorate but has trouble bringing anything up and has been using steam to try to help facilitate this. Chronic bilateral leg swelling has also worsened and states that he was started on HCTZ by a physician at the [place]. Patient was admitted for acute hypoxemic respiratory failure secondary to COPD and CHF exacerbation.  Patient seen and evaluated in the emergency department.  On examination, patient is sitting in a chair, on 3 L nasal cannula O2 without distress.  He has bilateral expiratory wheezes as well as +2 pitting edema bilaterally.  He states that he has not had great urine output with IV Lasix so far today.  A/P  Acute on chronic hypoxemic respiratory failure -SpO2 88% in the emergency department, requiring 3 L nasal cannula O2 at this time maintain saturation >90%.  He uses 2 L oxygen as needed at home  COPD exacerbation -Continue Solu-Medrol, breathing treatments, azithromycin  Acute on chronic diastolic heart failure -BNP 120.3.  Echocardiogram 02/18/2020 revealed EF 65 to 70%, grade 1 diastolic dysfunction -Increase dose of IV Lasix -Strict I's and O's, daily weight -Stop amlodipine in setting of peripheral edema  Hypertension -Continue  losartan  Hyperlipidemia -Continue Lipitor  Depression/anxiety -Continue Wellbutrin, Prozac, Trileptal     Status is: Inpatient  Remains inpatient appropriate because:IV treatments appropriate due to intensity of illness or inability to take PO and Inpatient level of care appropriate due to severity of illness   Dispo: The patient is from: Home              Anticipated d/c is to: Home              Patient currently is not medically stable to d/c.   Difficult to place patient No       Noralee Stain, DO [facility] 03/21/2020, 3:56 PM  Available via Epic secure chat 7am-7pm After these hours, please refer to coverage provider listed on [email or web address]

## 2020-03-21 NOTE — ED Notes (Signed)
[facility] at bedside.

## 2020-03-21 NOTE — H&P (Signed)
History and Physical    Lathon Adan QQV:[id number] DOB: [birth year] DOA: 03/20/2020  PCP: [facility]   Patient coming from: Home   Chief Complaint: worsening SOB, wheezing, leg swelling   HPI: Tanner Chang is a 65 y.o. male with medical history significant for COPD, chronic hypoxic respiratory failure, hypertension, anxiety, depression, and chronic diastolic CHF, now presenting to the emergency department for evaluation of worsening shortness of breath and wheezing. Patient was admitted 1 month ago with similar symptoms, never felt that he returned to baseline in terms of shortness of breath but was able to perform all his usual activities without limitation until approximately 1 week ago when his shortness of breath began to worsen. He is also having increased wheezing. He feels that he needs to expectorate but has trouble bringing anything up and has been using steam to try to help facilitate this. Chronic bilateral leg swelling has also worsened and states that he was started on HCTZ by a physician at the [place] Nebulized breathing treatments were helping at home until the past couple days which prompted his presentation now. He denies any fevers, chills, or chest pain.  ED Course: Upon arrival to the ED, patient is found to be afebrile, saturating in the low 90s on 2 L/min while at rest and desaturating to 84% with minimal activity at the bedside, mildly tachypneic and tachycardic, and with stable blood pressure. EKG features sinus rhythm with PVCs. Chest x-ray with stable emphysematous changes and chronic left hemidiaphragm elevation. Chemistry panel is unremarkable and CBC with slight normocytic anemia. BNP is slightly elevated. Patient was treated with albuterol, DuoNeb, IV Solu-Medrol, and IV magnesium, reported some improvement with this, but continues to be dyspneic at rest and desaturating with minimal activity. COVID-19 PCR was negative.  Review of Systems:  All other systems  reviewed and apart from HPI, are negative.  Past Medical History:  Diagnosis Date  . COPD (chronic obstructive pulmonary disease) (HCC)   . Hypertension     History reviewed. No pertinent surgical history.  Social History:   reports that he has quit smoking. He has quit using smokeless tobacco. He reports current alcohol use. He reports previous drug use.  Allergies  Allergen Reactions  . Penicillins Other (See Comments)    From childhood- reaction not recalled  . Sulfamethoxazole-Trimethoprim Swelling and Other (See Comments)    Tongue swells    History reviewed. No pertinent family history.   Prior to Admission medications   Medication Sig Start Date End Date Taking? Authorizing Provider  acetaminophen (TYLENOL) 500 MG tablet Take 1,000 mg by mouth every 6 (six) hours as needed for mild pain or headache.   Yes [provider]  albuterol (VENTOLIN HFA) 108 (90 Base) MCG/ACT inhaler Inhale 2 puffs into the lungs every 4 (four) hours as needed for wheezing or shortness of breath. 12/14/19  Yes Milagros Loll, MD  amLODipine (NORVASC) 5 MG tablet Take 5 mg by mouth in the morning.   Yes [provider]  Aspirin-Salicylamide-Caffeine (BC HEADACHE POWDER PO) Take 2 packets by mouth daily as needed (headaches).   Yes [provider]  atorvastatin (LIPITOR) 10 MG tablet Take 10 mg by mouth at bedtime.   Yes [provider]  budesonide-formoterol (SYMBICORT) 160-4.5 MCG/ACT inhaler Inhale 2 puffs into the lungs 2 (two) times daily.   Yes [provider]  buPROPion (WELLBUTRIN XL) 300 MG 24 hr tablet Take 300 mg by mouth daily with breakfast.   Yes [provider]  FLUoxetine (PROZAC) 20 MG capsule Take 20 mg by mouth in the morning.   Yes [provider]  Ipratropium-Albuterol (COMBIVENT RESPIMAT) 20-100 MCG/ACT AERS respimat Inhale 2 puffs into the lungs daily.   Yes [provider]  losartan (COZAAR) 100 MG  tablet Take 100 mg by mouth in the morning.   Yes [provider]  Multiple Vitamins-Minerals (ONE-A-DAY MENS 50+ ADVANTAGE) TABS Take 1 tablet by mouth daily with breakfast.   Yes [provider]  omeprazole (PRILOSEC) 20 MG capsule Take 20 mg by mouth daily before breakfast.   Yes [provider]  OVER THE COUNTER MEDICATION Take 1 tablet by mouth daily. Vitamin d, vitamin c, zinc combo vitamin   Yes [provider]  Oxcarbazepine (TRILEPTAL) 300 MG tablet Take 300 mg by mouth 2 (two) times daily.   Yes [provider]  Psyllium (METAMUCIL PO) Take by mouth See admin instructions. Mix 1 tablespoonful of powder into 8 ounces of water and drink once a day as needed for constipation   Yes [provider]    Physical Exam: Vitals:   03/21/20 0250 03/21/20 0300 03/21/20 0315 03/21/20 0418  BP: (!) 140/102 (!) 144/84  (!) 152/103  Pulse: (!) 108 (!) 101 (!) 101 (!) 110  Resp: 16 18 20  (!) 25  Temp: 98.4 F (36.9 C)     TempSrc:      SpO2: 92% (!) 88% 97% 95%    Constitutional: NAD, dyspneic with speech Eyes: PERTLA, lids and conjunctivae normal ENMT: Mucous membranes are moist. Posterior pharynx clear of any exudate or lesions.   Neck: normal, supple, no masses, no thyromegaly Respiratory: Breath sounds diminished bilaterally with prolonged expiratory phase. Increased work of breathing. No pallor or cyanosis.  Cardiovascular: S1 & S2 heard, regular rate and rhythm. Bilateral LE edema extending into thighs.  Abdomen: No distension, no tenderness, soft. Bowel sounds active.  Musculoskeletal: no clubbing / cyanosis. No joint deformity upper and lower extremities.   Skin: no significant rashes, lesions, ulcers. Warm, dry, well-perfused. Neurologic: CN 2-12 grossly intact. Sensation intact. Moving all extremities.  Psychiatric: Alert and oriented to person, place, and situation. Very pleasant and cooperative.   Labs and Imaging on  Admission: I have personally reviewed following labs and imaging studies  CBC: Recent Labs  Lab 03/20/20 2151  WBC 7.2  NEUTROABS 4.4  HGB 12.5*  HCT 37.7*  MCV 88.1  PLT 232   Basic Metabolic Panel: Recent Labs  Lab 03/20/20 2151  NA 135  K 3.9  CL 99  CO2 26  GLUCOSE 102*  BUN 13  CREATININE 0.92  CALCIUM 9.6   GFR: CrCl cannot be calculated (Unknown ideal weight.). Liver Function Tests: Recent Labs  Lab 03/20/20 2151  AST 25  ALT 28  ALKPHOS 56  BILITOT 0.9  PROT 6.5  ALBUMIN 3.8   No results for input(s): LIPASE, AMYLASE in the last 168 hours. No results for input(s): AMMONIA in the last 168 hours. Coagulation Profile: No results for input(s): INR, PROTIME in the last 168 hours. Cardiac Enzymes: No results for input(s): CKTOTAL, CKMB, CKMBINDEX, TROPONINI in the last 168 hours. BNP (last 3 results) No results for input(s): PROBNP in the last 8760 hours. HbA1C: No results for input(s): HGBA1C in the last 72 hours. CBG: No results for input(s): GLUCAP in the last 168 hours. Lipid Profile: No results for input(s): CHOL, HDL, LDLCALC, TRIG, CHOLHDL, LDLDIRECT in the last 72 hours. Thyroid Function Tests: No results  for input(s): TSH, T4TOTAL, FREET4, T3FREE, THYROIDAB in the last 72 hours. Anemia Panel: No results for input(s): VITAMINB12, FOLATE, FERRITIN, TIBC, IRON, RETICCTPCT in the last 72 hours. Urine analysis: No results found for: COLORURINE, APPEARANCEUR, LABSPEC, PHURINE, GLUCOSEU, HGBUR, BILIRUBINUR, KETONESUR, PROTEINUR, UROBILINOGEN, NITRITE, LEUKOCYTESUR Sepsis Labs: @LABRCNTIP (procalcitonin:4,lacticidven:4) ) Recent Results (from the past 240 hour(s))  Resp Panel by RT-PCR (Flu A&B, Covid) Nasopharyngeal Swab     Status: None   Collection Time: 03/20/20 10:09 PM   Specimen: Nasopharyngeal Swab; Nasopharyngeal(NP) swabs in vial transport medium  Result Value Ref Range Status   SARS Coronavirus 2 by RT PCR NEGATIVE NEGATIVE Final     Comment: (NOTE) SARS-CoV-2 target nucleic acids are NOT DETECTED.  The SARS-CoV-2 RNA is generally detectable in upper respiratory specimens during the acute phase of infection. The lowest concentration of SARS-CoV-2 viral copies this assay can detect is 138 copies/mL. A negative result does not preclude SARS-Cov-2 infection and should not be used as the sole basis for treatment or other patient management decisions. A negative result may occur with  improper specimen collection/handling, submission of specimen other than nasopharyngeal swab, presence of viral mutation(s) within the areas targeted by this assay, and inadequate number of viral copies(<138 copies/mL). A negative result must be combined with clinical observations, patient history, and epidemiological information. The expected result is Negative.  Fact Sheet for Patients:  BloggerCourse.comhttps://www.fda.gov/media/152166/download  Fact Sheet for Healthcare Providers:  SeriousBroker.ithttps://www.fda.gov/media/152162/download  This test is no t yet approved or cleared by the [place] FDA and  has been authorized for detection and/or diagnosis of SARS-CoV-2 by FDA under an Emergency Use Authorization (EUA). This EUA will remain  in effect (meaning this test can be used) for the duration of the COVID-19 declaration under Section 564(b)(1) of the Act, 21 U.S.C.section 360bbb-3(b)(1), unless the authorization is terminated  or revoked sooner.       Influenza A by PCR NEGATIVE NEGATIVE Final   Influenza B by PCR NEGATIVE NEGATIVE Final    Comment: (NOTE) The Xpert Xpress SARS-CoV-2/FLU/RSV plus assay is intended as an aid in the diagnosis of influenza from Nasopharyngeal swab specimens and should not be used as a sole basis for treatment. Nasal washings and aspirates are unacceptable for Xpert Xpress SARS-CoV-2/FLU/RSV testing.  Fact Sheet for Patients: BloggerCourse.comhttps://www.fda.gov/media/152166/download  Fact Sheet for Healthcare  Providers: SeriousBroker.ithttps://www.fda.gov/media/152162/download  This test is not yet approved or cleared by the [place] FDA and has been authorized for detection and/or diagnosis of SARS-CoV-2 by FDA under an Emergency Use Authorization (EUA). This EUA will remain in effect (meaning this test can be used) for the duration of the COVID-19 declaration under Section 564(b)(1) of the Act, 21 U.S.C. section 360bbb-3(b)(1), unless the authorization is terminated or revoked.  Performed at [facility], [address] [place], [place] [address]      Radiological Exams on Admission: DG Chest 2 View  Result Date: 03/20/2020 CLINICAL DATA:  Dyspnea on exertion for several months, COPD, hypertension, tobacco abuse EXAM: CHEST - 2 VIEW COMPARISON:  02/18/2020 FINDINGS: Frontal and lateral views of the chest demonstrate a stable cardiac silhouette. Chronic elevation of the left hemidiaphragm. Diffuse interstitial prominence consistent with given history of emphysema. No airspace disease, effusion, or pneumothorax. No acute bony abnormalities. IMPRESSION: 1. Stable emphysema.  No acute process. 2. Chronic elevation left hemidiaphragm. Electronically Signed   By: Sharlet SalinaMichael  Brown M.D.   On: 03/20/2020 19:22    EKG: Independently reviewed. Sinus rhythm, PVCs.   Assessment/Plan  1. COPD with acute exacerbation; acute on chronic hypoxic respiratory failure  - Patient with COPD and chronic 2 Lpm supplemental O2 requirement p/w 1 wk progressive SOB and wheezing and found to be dyspneic at rest and desaturating to 84% on 2 Lpm with minimal activity  - He was treated in ED with 125 mg IV Solu-Medrol, IV magnesium, and Duoneb  - Check sputum culture, continue systemic steroids, start azithromycin, continue ICS/LABA, schedule duonebs, continue prn albuterol   2. Acute on chronic diastolic CHF  - Patient reports progressive worsening in b/l leg swelling, noted to have pitting edema extending into thighs b/l   - Venous US b/l LEs neg for DVT last month  - Echo 1 mo ago with preserved EF and grade 1 diastolic dysfunction  - BNP slightly elevated  - Give one time dose Lasix now, monitor weight and I/Os    3. Hypertension   - Continue Norvasc and losartan    4. Depression, anxiety - Stable, continue Wellbutrin, Prozac, and oxcarbazepine    DVT prophylaxis: Lovenox  Code Status: Full  Level of Care: Level of care: Med-Surg Family Communication: None present  Disposition Plan:  Patient is from: Home  Anticipated d/c is to: Home  Anticipated d/c date is: 03/24/20 Patient currently: Dyspneic at rest, hypoxic with minimal activity Consults called: none  Admission status: Inpatient     Briscoe Deutscher, MD [facility]  03/21/2020, 5:08 AM

## 2020-03-21 NOTE — ED Notes (Signed)
Two unsuccessful IV attempts. Pt bled significantly from the stick. Wrapped in coban to help control bleeding.

## 2020-03-21 NOTE — ED Provider Notes (Signed)
12:30 AM Patient care assumed from [place], PA-C at change of shift.  In short, patient is a 65 year old male with a history of COPD and chronic respiratory failure on 2 L O2 via nasal cannula at baseline.  Presenting for worsening shortness of breath x1 week, dyspnea on exertion.  Symptoms unrelieved with home nebulizer treatments.  Had negative PE work up 3 months ago. CXR, labs today are stable.  Patient, on my assessment, visibly dyspneic and tachypneic.  He has diffuse expiratory wheezing.  Maintaining saturations of 94 to 95% on his chronic 2 L oxygen.  He has received Solu-Medrol as well as 4 puffs of albuterol.  Did use a nebulizer prior to EMS transport 6 hours ago.  We will attempt to maximize symptom control with DuoNeb x2 in the ED, magnesium infusion. Plan for reassessment to see if patient continues to have increased WOB after additional interventions.  2:28 AM Called to bedside while patient on 2nd DuoNeb. States he is feeling better and was able to ambulate to the bathroom which he couldn't do at home. Will assess pulse ox w/ambulation on home O2 requirement.  2:42 AM Patient reporting to RN that he only uses his oxygen at night and not typically during the day.  Per note on day of hospital discharge (02/20/19) - patient remained hypoxic on mobility which improved with 2 L oxygen. Discharged home with oxygen 2 L/min, especially needing with exertion. RN informed to ambulate while using home O2.  2:54 AM Ambulated pt on 2L [place]. Pt SpO2 maintained at 92%. Pt denies feeling SOB.  4:31 AM Patient observed until 2 hours post nebulizer treatment.  He is visibly tachypneic with recurrence of wheezing on auscultation.  Was found to have oxygen saturations dropped to 84% in his room while trying to get his personal items together.  He states that he became very short of breath when he attempted to put his pants on.  Additional nebulizer ordered for symptom control.  Presently 94% on 2 L at rest.   Will consult [facility] for admission.  4:51 AM Dr. Antionette Char to assess in the ED for admission.   Results for orders placed or performed during the hospital encounter of 03/20/20  Resp Panel by RT-PCR (Flu A&B, Covid) Nasopharyngeal Swab   Specimen: Nasopharyngeal Swab; Nasopharyngeal(NP) swabs in vial transport medium  Result Value Ref Range   SARS Coronavirus 2 by RT PCR NEGATIVE NEGATIVE   Influenza A by PCR NEGATIVE NEGATIVE   Influenza B by PCR NEGATIVE NEGATIVE  CBC with Differential  Result Value Ref Range   WBC 7.2 4.0 - 10.5 K/uL   RBC 4.28 4.22 - 5.81 MIL/uL   Hemoglobin 12.5 (L) 13.0 - 17.0 g/dL   HCT 97.6 (L) 73.4 - 19.3 %   MCV 88.1 80.0 - 100.0 fL   MCH 29.2 26.0 - 34.0 pg   MCHC 33.2 30.0 - 36.0 g/dL   RDW 79.0 24.0 - 97.3 %   Platelets 232 150 - 400 K/uL   nRBC 0.0 0.0 - 0.2 %   Neutrophils Relative % 61 %   Neutro Abs 4.4 1.7 - 7.7 K/uL   Lymphocytes Relative 19 %   Lymphs Abs 1.3 0.7 - 4.0 K/uL   Monocytes Relative 11 %   Monocytes Absolute 0.8 0.1 - 1.0 K/uL   Eosinophils Relative 7 %   Eosinophils Absolute 0.5 0.0 - 0.5 K/uL   Basophils Relative 1 %   Basophils Absolute 0.1 0.0 - 0.1 K/uL  Immature Granulocytes 1 %   Abs Immature Granulocytes 0.08 (H) 0.00 - 0.07 K/uL  Comprehensive metabolic panel  Result Value Ref Range   Sodium 135 135 - 145 mmol/L   Potassium 3.9 3.5 - 5.1 mmol/L   Chloride 99 98 - 111 mmol/L   CO2 26 22 - 32 mmol/L   Glucose, Bld 102 (H) 70 - 99 mg/dL   BUN 13 8 - 23 mg/dL   Creatinine, Ser 6.38 0.61 - 1.24 mg/dL   Calcium 9.6 8.9 - 46.6 mg/dL   Total Protein 6.5 6.5 - 8.1 g/dL   Albumin 3.8 3.5 - 5.0 g/dL   AST 25 15 - 41 U/L   ALT 28 0 - 44 U/L   Alkaline Phosphatase 56 38 - 126 U/L   Total Bilirubin 0.9 0.3 - 1.2 mg/dL   GFR, Estimated >59 >93 mL/min   Anion gap 10 5 - 15  Brain natriuretic peptide  Result Value Ref Range   B Natriuretic Peptide 120.3 (H) 0.0 - 100.0 pg/mL   DG Chest 2 View  Result Date:  03/20/2020 CLINICAL DATA:  Dyspnea on exertion for several months, COPD, hypertension, tobacco abuse EXAM: CHEST - 2 VIEW COMPARISON:  02/18/2020 FINDINGS: Frontal and lateral views of the chest demonstrate a stable cardiac silhouette. Chronic elevation of the left hemidiaphragm. Diffuse interstitial prominence consistent with given history of emphysema. No airspace disease, effusion, or pneumothorax. No acute bony abnormalities. IMPRESSION: 1. Stable emphysema.  No acute process. 2. Chronic elevation left hemidiaphragm. Electronically Signed   By: Sharlet Salina M.D.   On: 03/20/2020 19:22     Antony Madura, PA-C 03/21/20 0451    Nira Conn, MD 03/21/20 [phone]

## 2020-03-21 NOTE — Plan of Care (Signed)

## 2020-03-22 LAB — CBC
HCT: 36 % — ABNORMAL LOW (ref 39.0–52.0)
Hemoglobin: 12.3 g/dL — ABNORMAL LOW (ref 13.0–17.0)
MCH: 29.4 pg (ref 26.0–34.0)
MCHC: 34.2 g/dL (ref 30.0–36.0)
MCV: 86.1 fL (ref 80.0–100.0)
Platelets: 271 10*3/uL (ref 150–400)
RBC: 4.18 MIL/uL — ABNORMAL LOW (ref 4.22–5.81)
RDW: 15.1 % (ref 11.5–15.5)
WBC: 10.9 10*3/uL — ABNORMAL HIGH (ref 4.0–10.5)
nRBC: 0 % (ref 0.0–0.2)

## 2020-03-22 LAB — BASIC METABOLIC PANEL
Anion gap: 9 (ref 5–15)
BUN: 25 mg/dL — ABNORMAL HIGH (ref 8–23)
CO2: 30 mmol/L (ref 22–32)
Calcium: 9.7 mg/dL (ref 8.9–10.3)
Chloride: 97 mmol/L — ABNORMAL LOW (ref 98–111)
Creatinine, Ser: 1.09 mg/dL (ref 0.61–1.24)
GFR, Estimated: >60 mL/min (ref >60)
Glucose, Bld: 203 mg/dL — ABNORMAL HIGH (ref 70–99)
Potassium: 4.5 mmol/L (ref 3.5–5.1)
Sodium: 136 mmol/L (ref 135–145)

## 2020-03-22 MED ORDER — LIVING BETTER WITH HEART FAILURE BOOK: Freq: Once | Status: DC

## 2020-03-22 MED ORDER — TRAZODONE HCL 100 MG PO TABS
100.0000 mg | ORAL_TABLET | Freq: Once | ORAL | Status: AC
  Administered 2020-03-22: 100 mg via ORAL
  Filled 2020-03-22: qty 1

## 2020-03-22 NOTE — Progress Notes (Signed)
PROGRESS NOTE    Tanner Chang  FXT:[id number] DOB: 1955/10/31 DOA: 03/20/2020 PCP: [facility]     Brief Narrative:  Tanner Chang a 65 y.o.malewith medical history significant forCOPD, chronic hypoxic respiratory failure using supplemental O2 at home on a PRN basis, hypertension, anxiety, depression, and chronic diastolic CHF, now presenting to the emergency department for evaluation of worsening shortness of breath and wheezing. Patient was admitted 1 month ago with similar symptoms, never felt that he returned to baseline in terms of shortness of breath but was able to perform all his usual activities without limitation until approximately 1 week ago when his shortness of breath began to worsen. He is also having increased wheezing. He feels that he needs to expectorate but has trouble bringing anything up and has been using steam to try to help facilitate this. Chronic bilateral leg swelling has also worsened and states that he was started on HCTZ by a physician at the [place]. Patient was admitted for acute hypoxemic respiratory failure secondary to COPD and CHF exacerbation.   New events last 24 hours / Subjective: Patient sitting in chair, on 3 L of oxygen.  States that he has been ambulating down in the hallways today.  A little jittery after his breathing treatment this morning.  Breathing has improved, although still significantly volume overloaded and has peripheral extremities.  Patient with 1.9 L urine output in the last 24 hours.  Assessment & Plan:   Principal Problem:   COPD with acute exacerbation (HCC) Active Problems:   Essential hypertension   Acute on chronic respiratory failure with hypoxia (HCC)   Anxiety and depression   Acute on chronic hypoxemic respiratory failure -SpO2 88% in the emergency department, requiring 2 L nasal cannula O2 at this time maintain saturation >90%.  He uses 2 L oxygen as needed at home  COPD exacerbation -Continue Solu-Medrol  --> prednisone, breathing treatments, azithromycin  Acute on chronic diastolic heart failure -BNP 120.3.  Echocardiogram 02/18/2020 revealed EF 65 to 70%, grade 1 diastolic dysfunction -Continue IV Lasix -Strict I's and O's, daily weight -Stop amlodipine in setting of peripheral edema -TED hose  Hypertension -Continue losartan  Hyperlipidemia -Continue Lipitor  Depression/anxiety -Continue Wellbutrin, Prozac, Trileptal     DVT prophylaxis:  Place TED hose Start: 03/22/20 0953 enoxaparin (LOVENOX) injection 40 mg Start: 03/21/20 1400  Code Status: Full code Family Communication: No family at bedside Disposition Plan:  Status is: Inpatient  Remains inpatient appropriate because:IV treatments appropriate due to intensity of illness or inability to take PO   Dispo: The patient is from: Home              Anticipated d/c is to: Home              Patient currently is not medically stable to d/c.  Remains on IV Lasix   Difficult to place patient No   Antimicrobials:  Anti-infectives (From admission, onward)   Start     Dose/Rate Route Frequency Ordered Stop   03/22/20 1000  azithromycin (ZITHROMAX) tablet 250 mg       "Followed by" Linked Group Details   250 mg Oral Daily 03/21/20 0500 03/26/20 0959   03/21/20 1000  azithromycin (ZITHROMAX) tablet 500 mg       "Followed by" Linked Group Details   500 mg Oral Daily 03/21/20 0500 03/21/20 0904        Objective: Vitals:   03/21/20 1959 03/22/20 0511 03/22/20 0554 03/22/20 0802  BP:  139/81  Pulse:  87    Resp:  20    Temp:  97.8 F (36.6 C)    TempSrc:  Oral    SpO2: 92% 95%  95%  Weight:   89.3 kg     Intake/Output Summary (Last 24 hours) at 03/22/2020 1042 Last data filed at 03/22/2020 0400 Gross per 24 hour  Intake 120 ml  Output 1320 ml  Net -1200 ml   Filed Weights   03/22/20 0554  Weight: 89.3 kg    Examination:  General exam: Appears calm and comfortable  Respiratory system: Clear to  auscultation. Respiratory effort normal. No respiratory distress. No conversational dyspnea.  On 2 L oxygen Cardiovascular system: S1 & S2 heard, RRR. No murmurs. +2 pitting bilateral pedal edema. Gastrointestinal system: Abdomen is nondistended, soft and nontender. Normal bowel sounds heard. Central nervous system: Alert and oriented. No focal neurological deficits. Speech clear.  Extremities: Symmetric in appearance  Skin: No rashes, lesions or ulcers on exposed skin  Psychiatry: Judgement and insight appear normal. Mood & affect appropriate.   Data Reviewed: I have personally reviewed following labs and imaging studies  CBC: Recent Labs  Lab 03/20/20 2151 03/21/20 0500 03/22/20 0102  WBC 7.2 6.2 10.9*  NEUTROABS 4.4  --   --   HGB 12.5* 12.7* 12.3*  HCT 37.7* 37.5* 36.0*  MCV 88.1 85.8 86.1  PLT 232 254 271   Basic Metabolic Panel: Recent Labs  Lab 03/20/20 2151 03/21/20 0500 03/22/20 0102  NA 135 134* 136  K 3.9 4.5 4.5  CL 99 97* 97*  CO2 26 24 30   GLUCOSE 102* 145* 203*  BUN 13 14 25*  CREATININE 0.92 0.81 1.09  CALCIUM 9.6 9.7 9.7   GFR: Estimated Creatinine Clearance: 73.5 mL/min (by C-G formula based on SCr of 1.09 mg/dL). Liver Function Tests: Recent Labs  Lab 03/20/20 2151  AST 25  ALT 28  ALKPHOS 56  BILITOT 0.9  PROT 6.5  ALBUMIN 3.8   No results for input(s): LIPASE, AMYLASE in the last 168 hours. No results for input(s): AMMONIA in the last 168 hours. Coagulation Profile: No results for input(s): INR, PROTIME in the last 168 hours. Cardiac Enzymes: No results for input(s): CKTOTAL, CKMB, CKMBINDEX, TROPONINI in the last 168 hours. BNP (last 3 results) No results for input(s): PROBNP in the last 8760 hours. HbA1C: No results for input(s): HGBA1C in the last 72 hours. CBG: Recent Labs  Lab 03/21/20 1947  GLUCAP 152*   Lipid Profile: No results for input(s): CHOL, HDL, LDLCALC, TRIG, CHOLHDL, LDLDIRECT in the last 72 hours. Thyroid  Function Tests: No results for input(s): TSH, T4TOTAL, FREET4, T3FREE, THYROIDAB in the last 72 hours. Anemia Panel: No results for input(s): VITAMINB12, FOLATE, FERRITIN, TIBC, IRON, RETICCTPCT in the last 72 hours. Sepsis Labs: No results for input(s): PROCALCITON, LATICACIDVEN in the last 168 hours.  Recent Results (from the past 240 hour(s))  Resp Panel by RT-PCR (Flu A&B, Covid) Nasopharyngeal Swab     Status: None   Collection Time: 03/20/20 10:09 PM   Specimen: Nasopharyngeal Swab; Nasopharyngeal(NP) swabs in vial transport medium  Result Value Ref Range Status   SARS Coronavirus 2 by RT PCR NEGATIVE NEGATIVE Final    Comment: (NOTE) SARS-CoV-2 target nucleic acids are NOT DETECTED.  The SARS-CoV-2 RNA is generally detectable in upper respiratory specimens during the acute phase of infection. The lowest concentration of SARS-CoV-2 viral copies this assay can detect is 138 copies/mL. A negative result does not preclude SARS-Cov-2  infection and should not be used as the sole basis for treatment or other patient management decisions. A negative result may occur with  improper specimen collection/handling, submission of specimen other than nasopharyngeal swab, presence of viral mutation(s) within the areas targeted by this assay, and inadequate number of viral copies(<138 copies/mL). A negative result must be combined with clinical observations, patient history, and epidemiological information. The expected result is Negative.  Fact Sheet for Patients:  BloggerCourse.com  Fact Sheet for Healthcare Providers:  SeriousBroker.it  This test is no t yet approved or cleared by the [place] FDA and  has been authorized for detection and/or diagnosis of SARS-CoV-2 by FDA under an Emergency Use Authorization (EUA). This EUA will remain  in effect (meaning this test can be used) for the duration of the COVID-19 declaration under  Section 564(b)(1) of the Act, 21 U.S.C.section 360bbb-3(b)(1), unless the authorization is terminated  or revoked sooner.       Influenza A by PCR NEGATIVE NEGATIVE Final   Influenza B by PCR NEGATIVE NEGATIVE Final    Comment: (NOTE) The Xpert Xpress SARS-CoV-2/FLU/RSV plus assay is intended as an aid in the diagnosis of influenza from Nasopharyngeal swab specimens and should not be used as a sole basis for treatment. Nasal washings and aspirates are unacceptable for Xpert Xpress SARS-CoV-2/FLU/RSV testing.  Fact Sheet for Patients: BloggerCourse.com  Fact Sheet for Healthcare Providers: SeriousBroker.it  This test is not yet approved or cleared by the [place] FDA and has been authorized for detection and/or diagnosis of SARS-CoV-2 by FDA under an Emergency Use Authorization (EUA). This EUA will remain in effect (meaning this test can be used) for the duration of the COVID-19 declaration under Section 564(b)(1) of the Act, 21 U.S.C. section 360bbb-3(b)(1), unless the authorization is terminated or revoked.  Performed at [facility], [address], [place], [place] [address]       Radiology Studies: DG Chest 2 View  Result Date: 03/20/2020 CLINICAL DATA:  Dyspnea on exertion for several months, COPD, hypertension, tobacco abuse EXAM: CHEST - 2 VIEW COMPARISON:  02/18/2020 FINDINGS: Frontal and lateral views of the chest demonstrate a stable cardiac silhouette. Chronic elevation of the left hemidiaphragm. Diffuse interstitial prominence consistent with given history of emphysema. No airspace disease, effusion, or pneumothorax. No acute bony abnormalities. IMPRESSION: 1. Stable emphysema.  No acute process. 2. Chronic elevation left hemidiaphragm. Electronically Signed   By: Sharlet Salina M.D.   On: 03/20/2020 19:22      Scheduled Meds: . atorvastatin  10 mg Oral QHS  . azithromycin  250 mg Oral Daily  .  buPROPion  300 mg Oral Q breakfast  . enoxaparin (LOVENOX) injection  40 mg Subcutaneous Q24H  . FLUoxetine  20 mg Oral Daily  . furosemide  40 mg Intravenous BID  . Ipratropium-Albuterol  2 puff Inhalation Daily  . Living Better with Heart Failure Book   Does not apply Once  . losartan  100 mg Oral Daily  . mometasone-formoterol  2 puff Inhalation BID  . Oxcarbazepine  300 mg Oral BID  . pantoprazole  40 mg Oral Daily  . predniSONE  40 mg Oral Q breakfast   Continuous Infusions: . sodium chloride       LOS: 1 day      Time spent: 25 minutes   Noralee Stain, DO [facility] 03/22/2020, 10:42 AM   Available via Epic secure chat 7am-7pm After these hours, please refer to coverage provider listed on [email or web address]

## 2020-03-22 NOTE — Plan of Care (Signed)
  Problem: Education: Goal: Knowledge of disease or condition will improve Outcome: Progressing Goal: Knowledge of the prescribed therapeutic regimen will improve Outcome: Progressing Goal: Individualized Educational Video(s) Outcome: Progressing   Problem: Activity: Goal: Ability to tolerate increased activity will improve Outcome: Progressing Goal: Will verbalize the importance of balancing activity with adequate rest periods Outcome: Progressing   Problem: Respiratory: Goal: Ability to maintain a clear airway will improve Outcome: Progressing Goal: Levels of oxygenation will improve Outcome: Progressing Goal: Ability to maintain adequate ventilation will improve Outcome: Progressing   Problem: Education: Goal: Knowledge of General Education information will improve Description: Including pain rating scale, medication(s)/side effects and non-pharmacologic comfort measures Outcome: Progressing   Problem: Health Behavior/Discharge Planning: Goal: Ability to manage health-related needs will improve Outcome: Progressing   Problem: Clinical Measurements: Goal: Ability to maintain clinical measurements within normal limits will improve Outcome: Progressing Goal: Will remain free from infection Outcome: Progressing Goal: Diagnostic test results will improve Outcome: Progressing Goal: Respiratory complications will improve Outcome: Progressing Goal: Cardiovascular complication will be avoided Outcome: Progressing   Problem: Activity: Goal: Risk for activity intolerance will decrease Outcome: Progressing   Problem: Nutrition: Goal: Adequate nutrition will be maintained Outcome: Progressing   Problem: Coping: Goal: Level of anxiety will decrease Outcome: Progressing   Problem: Elimination: Goal: Will not experience complications related to bowel motility Outcome: Progressing Goal: Will not experience complications related to urinary retention Outcome: Progressing    Problem: Pain Managment: Goal: General experience of comfort will improve Outcome: Progressing   Problem: Skin Integrity: Goal: Risk for impaired skin integrity will decrease Outcome: Progressing   Problem: Safety: Goal: Ability to remain free from injury will improve Outcome: Progressing   Problem: Education: Goal: Ability to demonstrate management of disease process will improve Outcome: Progressing Goal: Ability to verbalize understanding of medication therapies will improve Outcome: Progressing Goal: Individualized Educational Video(s) Outcome: Progressing   Problem: Activity: Goal: Capacity to carry out activities will improve Outcome: Progressing   Problem: Cardiac: Goal: Ability to achieve and maintain adequate cardiopulmonary perfusion will improve Outcome: Progressing

## 2020-03-23 DIAGNOSIS — I5033 Acute on chronic diastolic (congestive) heart failure: Secondary | ICD-10-CM

## 2020-03-23 LAB — BASIC METABOLIC PANEL
Anion gap: 10 (ref 5–15)
BUN: 34 mg/dL — ABNORMAL HIGH (ref 8–23)
CO2: 31 mmol/L (ref 22–32)
Calcium: 9.4 mg/dL (ref 8.9–10.3)
Chloride: 97 mmol/L — ABNORMAL LOW (ref 98–111)
Creatinine, Ser: 1.01 mg/dL (ref 0.61–1.24)
GFR, Estimated: >60 mL/min (ref >60)
Glucose, Bld: 104 mg/dL — ABNORMAL HIGH (ref 70–99)
Potassium: 3.5 mmol/L (ref 3.5–5.1)
Sodium: 138 mmol/L (ref 135–145)

## 2020-03-23 MED ORDER — POTASSIUM CHLORIDE CRYS ER 20 MEQ PO TBCR
40.0000 meq | EXTENDED_RELEASE_TABLET | Freq: Once | ORAL | Status: AC
  Administered 2020-03-23: 40 meq via ORAL
  Filled 2020-03-23: qty 2

## 2020-03-23 MED ORDER — TRAZODONE HCL 100 MG PO TABS
100.0000 mg | ORAL_TABLET | Freq: Every evening | ORAL | Status: DC | PRN
  Administered 2020-03-24: 100 mg via ORAL
  Filled 2020-03-23 (×2): qty 1

## 2020-03-23 NOTE — Progress Notes (Signed)
PROGRESS NOTE    Tanner Chang  YBO:[id number] DOB: 05/14/1955 DOA: 03/20/2020 PCP: [facility]     Brief Narrative:  Tanner Chang a 65 y.o.malewith medical history significant forCOPD, chronic hypoxic respiratory failure using supplemental O2 at home on a PRN basis, hypertension, anxiety, depression, and chronic diastolic CHF, now presenting to the emergency department for evaluation of worsening shortness of breath and wheezing. Patient was admitted 1 month ago with similar symptoms, never felt that he returned to baseline in terms of shortness of breath but was able to perform all his usual activities without limitation until approximately 1 week ago when his shortness of breath began to worsen. He is also having increased wheezing. He feels that he needs to expectorate but has trouble bringing anything up and has been using steam to try to help facilitate this. Chronic bilateral leg swelling has also worsened and states that he was started on HCTZ by a physician at the [place]. Patient was admitted for acute hypoxemic respiratory failure secondary to COPD and CHF exacerbation.   New events last 24 hours / Subjective: Patient sitting in the chair this morning, no physical complaints.  States of breathing has improved although remains some shortness of breath still and not at baseline.  Has been urinating much, only documented 450 mL last 24 hours which is inaccurately recorded.  Assessment & Plan:   Principal Problem:   Acute on chronic diastolic CHF (congestive heart failure) (HCC) Active Problems:   COPD with acute exacerbation (HCC)   Essential hypertension   Acute on chronic respiratory failure with hypoxia (HCC)   Anxiety and depression   Acute on chronic hypoxemic respiratory failure -SpO2 88% in the emergency department, requiring 2 L nasal cannula O2 at this time maintain saturation >90%.  He uses 2 L oxygen as needed at home  COPD exacerbation -Continue  Solu-Medrol --> prednisone, breathing treatments, azithromycin  Acute on chronic diastolic heart failure -BNP 120.3.  Echocardiogram 02/18/2020 revealed EF 65 to 70%, grade 1 diastolic dysfunction -Continue IV Lasix -Strict I's and O's, daily weight -Stop amlodipine in setting of peripheral edema -TED hose  Hypertension -Continue losartan  Hyperlipidemia -Continue Lipitor  Depression/anxiety -Continue Wellbutrin, Prozac, Trileptal     DVT prophylaxis:  Place TED hose Start: 03/22/20 0953 enoxaparin (LOVENOX) injection 40 mg Start: 03/21/20 1400  Code Status: Full code Family Communication: No family at bedside Disposition Plan:  Status is: Inpatient  Remains inpatient appropriate because:IV treatments appropriate due to intensity of illness or inability to take PO   Dispo: The patient is from: Home              Anticipated d/c is to: Home              Patient currently is not medically stable to d/c.  Remains on IV Lasix   Difficult to place patient No   Antimicrobials:  Anti-infectives (From admission, onward)   Start     Dose/Rate Route Frequency Ordered Stop   03/22/20 1000  azithromycin (ZITHROMAX) tablet 250 mg       "Followed by" Linked Group Details   250 mg Oral Daily 03/21/20 0500 03/26/20 0959   03/21/20 1000  azithromycin (ZITHROMAX) tablet 500 mg       "Followed by" Linked Group Details   500 mg Oral Daily 03/21/20 0500 03/21/20 0904       Objective: Vitals:   03/22/20 2123 03/23/20 0608 03/23/20 0755 03/23/20 1139  BP: 113/65 115/81  104/68  Pulse:  89 87  77  Resp: 17 20  20   Temp: 98.4 F (36.9 C) 98.9 F (37.2 C)  98.9 F (37.2 C)  TempSrc: Oral Oral  Oral  SpO2: 96% 95% 93% 94%  Weight:        Intake/Output Summary (Last 24 hours) at 03/23/2020 1155 Last data filed at 03/23/2020 0330 Gross per 24 hour  Intake --  Output 450 ml  Net -450 ml   Filed Weights   03/22/20 0554  Weight: 89.3 kg    Examination: General exam:  Appears calm and comfortable  Respiratory system: Clear to auscultation. Respiratory effort normal.  On 2 L nasal cannula O2 Cardiovascular system: S1 & S2 heard, RRR. +2 pedal edema, but improved from previous exam.  TED hose in place. Gastrointestinal system: Abdomen is nondistended, soft and nontender. Normal bowel sounds heard. Central nervous system: Alert and oriented. Non focal exam. Speech clear  Extremities: Symmetric in appearance bilaterally  Skin: No rashes, lesions or ulcers on exposed skin  Psychiatry: Judgement and insight appear stable. Mood & affect appropriate.    Data Reviewed: I have personally reviewed following labs and imaging studies  CBC: Recent Labs  Lab 03/20/20 2151 03/21/20 0500 03/22/20 0102  WBC 7.2 6.2 10.9*  NEUTROABS 4.4  --   --   HGB 12.5* 12.7* 12.3*  HCT 37.7* 37.5* 36.0*  MCV 88.1 85.8 86.1  PLT 232 254 271   Basic Metabolic Panel: Recent Labs  Lab 03/20/20 2151 03/21/20 0500 03/22/20 0102 03/23/20 0228  NA 135 134* 136 138  K 3.9 4.5 4.5 3.5  CL 99 97* 97* 97*  CO2 26 24 30 31   GLUCOSE 102* 145* 203* 104*  BUN 13 14 25* 34*  CREATININE 0.92 0.81 1.09 1.01  CALCIUM 9.6 9.7 9.7 9.4   GFR: Estimated Creatinine Clearance: 79.4 mL/min (by C-G formula based on SCr of 1.01 mg/dL). Liver Function Tests: Recent Labs  Lab 03/20/20 2151  AST 25  ALT 28  ALKPHOS 56  BILITOT 0.9  PROT 6.5  ALBUMIN 3.8   No results for input(s): LIPASE, AMYLASE in the last 168 hours. No results for input(s): AMMONIA in the last 168 hours. Coagulation Profile: No results for input(s): INR, PROTIME in the last 168 hours. Cardiac Enzymes: No results for input(s): CKTOTAL, CKMB, CKMBINDEX, TROPONINI in the last 168 hours. BNP (last 3 results) No results for input(s): PROBNP in the last 8760 hours. HbA1C: No results for input(s): HGBA1C in the last 72 hours. CBG: Recent Labs  Lab 03/21/20 1947  GLUCAP 152*   Lipid Profile: No results for  input(s): CHOL, HDL, LDLCALC, TRIG, CHOLHDL, LDLDIRECT in the last 72 hours. Thyroid Function Tests: No results for input(s): TSH, T4TOTAL, FREET4, T3FREE, THYROIDAB in the last 72 hours. Anemia Panel: No results for input(s): VITAMINB12, FOLATE, FERRITIN, TIBC, IRON, RETICCTPCT in the last 72 hours. Sepsis Labs: No results for input(s): PROCALCITON, LATICACIDVEN in the last 168 hours.  Recent Results (from the past 240 hour(s))  Resp Panel by RT-PCR (Flu A&B, Covid) Nasopharyngeal Swab     Status: None   Collection Time: 03/20/20 10:09 PM   Specimen: Nasopharyngeal Swab; Nasopharyngeal(NP) swabs in vial transport medium  Result Value Ref Range Status   SARS Coronavirus 2 by RT PCR NEGATIVE NEGATIVE Final    Comment: (NOTE) SARS-CoV-2 target nucleic acids are NOT DETECTED.  The SARS-CoV-2 RNA is generally detectable in upper respiratory specimens during the acute phase of infection. The lowest concentration of SARS-CoV-2 viral  copies this assay can detect is 138 copies/mL. A negative result does not preclude SARS-Cov-2 infection and should not be used as the sole basis for treatment or other patient management decisions. A negative result may occur with  improper specimen collection/handling, submission of specimen other than nasopharyngeal swab, presence of viral mutation(s) within the areas targeted by this assay, and inadequate number of viral copies(<138 copies/mL). A negative result must be combined with clinical observations, patient history, and epidemiological information. The expected result is Negative.  Fact Sheet for Patients:  BloggerCourse.com  Fact Sheet for Healthcare Providers:  SeriousBroker.it  This test is no t yet approved or cleared by the [place] FDA and  has been authorized for detection and/or diagnosis of SARS-CoV-2 by FDA under an Emergency Use Authorization (EUA). This EUA will remain  in effect  (meaning this test can be used) for the duration of the COVID-19 declaration under Section 564(b)(1) of the Act, 21 U.S.C.section 360bbb-3(b)(1), unless the authorization is terminated  or revoked sooner.       Influenza A by PCR NEGATIVE NEGATIVE Final   Influenza B by PCR NEGATIVE NEGATIVE Final    Comment: (NOTE) The Xpert Xpress SARS-CoV-2/FLU/RSV plus assay is intended as an aid in the diagnosis of influenza from Nasopharyngeal swab specimens and should not be used as a sole basis for treatment. Nasal washings and aspirates are unacceptable for Xpert Xpress SARS-CoV-2/FLU/RSV testing.  Fact Sheet for Patients: BloggerCourse.com  Fact Sheet for Healthcare Providers: SeriousBroker.it  This test is not yet approved or cleared by the [place] FDA and has been authorized for detection and/or diagnosis of SARS-CoV-2 by FDA under an Emergency Use Authorization (EUA). This EUA will remain in effect (meaning this test can be used) for the duration of the COVID-19 declaration under Section 564(b)(1) of the Act, 21 U.S.C. section 360bbb-3(b)(1), unless the authorization is terminated or revoked.  Performed at [facility], [address], [place], [place] [address]       Radiology Studies: No results found.    Scheduled Meds: . atorvastatin  10 mg Oral QHS  . azithromycin  250 mg Oral Daily  . buPROPion  300 mg Oral Q breakfast  . enoxaparin (LOVENOX) injection  40 mg Subcutaneous Q24H  . FLUoxetine  20 mg Oral Daily  . furosemide  40 mg Intravenous BID  . Ipratropium-Albuterol  2 puff Inhalation Daily  . Living Better with Heart Failure Book   Does not apply Once  . losartan  100 mg Oral Daily  . mometasone-formoterol  2 puff Inhalation BID  . Oxcarbazepine  300 mg Oral BID  . pantoprazole  40 mg Oral Daily  . predniSONE  40 mg Oral Q breakfast   Continuous Infusions: . sodium chloride       LOS: 2 days       Time spent: 25 minutes   Noralee Stain, DO [facility] 03/23/2020, 11:55 AM   Available via Epic secure chat 7am-7pm After these hours, please refer to coverage provider listed on [email or web address]

## 2020-03-23 NOTE — TOC Initial Note (Signed)
Transition of Care [facility]) - Initial/Assessment Note    Patient Details  Name: Tanner Chang MRN: [id number] Date of Birth: 1955-02-23  Transition of Care [facility]) CM/SW Contact:    Joanne Chars, LCSW Phone Number: 03/23/2020, 2:17 PM  Clinical Narrative:  CSW met with pt regarding discharge plan. Permission given to speak with pt mother.  Pt goes to [place] for PCP and gets home O2 through [place] as well.  Pt requesting to change to private O2 provder to due [facility] being unable to provide anything portable. Pt has VA coverage and also medicaid.   (CSW spoke with Zack from [place] and they can provide O2 to [place] client through [facility].)  Pt is vaccinated for covid and boosted.  No equipment in the home except O2.                 Expected Discharge Plan: Home/Self Care Barriers to Discharge: Continued Medical Work up   Patient Goals and CMS Choice Patient states their goals for this hospitalization and ongoing recovery are:: "get back to normal life" CMS Medicare.gov Compare Post Acute Care list provided to::  (na)    Expected Discharge Plan and Services Expected Discharge Plan: Home/Self Care     Post Acute Care Choice: Durable Medical Equipment Living arrangements for the past 2 months: Apartment                                      Prior Living Arrangements/Services Living arrangements for the past 2 months: Apartment Lives with:: Parents Patient language and need for interpreter reviewed:: Yes Do you feel safe going back to the place where you live?: Yes      Need for Family Participation in Patient Care: No (Comment) Care giver support system in place?: Yes (comment) Current home services: DME (Home O2 through the [place]) Criminal Activity/Legal Involvement Pertinent to Current Situation/Hospitalization: No - Comment as needed  Activities of Daily Living Home Assistive Devices/Equipment: None ADL Screening (condition at time of admission) Patient's cognitive  ability adequate to safely complete daily activities?: Yes Is the patient deaf or have difficulty hearing?: No Does the patient have difficulty seeing, even when wearing glasses/contacts?: No Does the patient have difficulty concentrating, remembering, or making decisions?: No Patient able to express need for assistance with ADLs?: No Does the patient have difficulty dressing or bathing?: No Independently performs ADLs?: Yes (appropriate for developmental age) Does the patient have difficulty walking or climbing stairs?: Yes Weakness of Legs: None Weakness of Arms/Hands: None  Permission Sought/Granted Permission sought to share information with : Family Supports Permission granted to share information with : Yes, Verbal Permission Granted  Share Information with NAME: mother Mardene Celeste           Emotional Assessment Appearance:: Appears stated age Attitude/Demeanor/Rapport: Engaged Affect (typically observed): Appropriate,Pleasant Orientation: : Oriented to Self,Oriented to Place,Oriented to  Time,Oriented to Situation Alcohol / Substance Use: Not Applicable Psych Involvement: No (comment)  Admission diagnosis:  COPD exacerbation ([place]) [J44.1] COPD with acute exacerbation ([place]) [J44.1] Patient Active Problem List   Diagnosis Date Noted  . Acute on chronic diastolic CHF (congestive heart failure) ([place]) 03/23/2020  . Acute on chronic respiratory failure with hypoxia ([place]) 03/21/2020  . Anxiety and depression 03/21/2020  . COPD with acute exacerbation ([place]) 02/16/2020  . Essential hypertension 02/16/2020  . Hypokalemia 02/16/2020  . COPD exacerbation ([place]) 02/16/2020  . Coronary artery calcification  seen on CT scan 12/14/2019   PCP:  [place]:   [place], [place] - [facility] [facility] [address] [place] [address] Phone: [phone] Fax: [phone]     Social Determinants of  Health (SDOH) Interventions    Readmission Risk Interventions No flowsheet data found.

## 2020-03-23 NOTE — Plan of Care (Signed)
  Problem: Respiratory: Goal: Ability to maintain a clear airway will improve Outcome: Progressing   Problem: Respiratory: Goal: Levels of oxygenation will improve Outcome: Progressing   Problem: Respiratory: Goal: Ability to maintain adequate ventilation will improve Outcome: Progressing   

## 2020-03-24 LAB — BASIC METABOLIC PANEL WITH GFR
Anion gap: 9 (ref 5–15)
BUN: 32 mg/dL — ABNORMAL HIGH (ref 8–23)
CO2: 34 mmol/L — ABNORMAL HIGH (ref 22–32)
Calcium: 9.8 mg/dL (ref 8.9–10.3)
Chloride: 96 mmol/L — ABNORMAL LOW (ref 98–111)
Creatinine, Ser: 1.07 mg/dL (ref 0.61–1.24)
GFR, Estimated: >60 mL/min
Glucose, Bld: 109 mg/dL — ABNORMAL HIGH (ref 70–99)
Potassium: 3.6 mmol/L (ref 3.5–5.1)
Sodium: 139 mmol/L (ref 135–145)

## 2020-03-24 MED ORDER — POTASSIUM CHLORIDE CRYS ER 20 MEQ PO TBCR
40.0000 meq | EXTENDED_RELEASE_TABLET | Freq: Once | ORAL | Status: AC
  Administered 2020-03-24: 40 meq via ORAL
  Filled 2020-03-24: qty 2

## 2020-03-24 MED ORDER — ACETAMINOPHEN 500 MG PO TABS
1000.0000 mg | ORAL_TABLET | Freq: Four times a day (QID) | ORAL | Status: DC | PRN
  Administered 2020-03-24 – 2020-03-25 (×5): 1000 mg via ORAL
  Filled 2020-03-24 (×5): qty 2

## 2020-03-24 MED ORDER — POLYVINYL ALCOHOL 1.4 % OP SOLN
2.0000 [drp] | OPHTHALMIC | Status: DC | PRN
  Administered 2020-03-25: 2 [drp] via OPHTHALMIC
  Filled 2020-03-24: qty 15

## 2020-03-24 NOTE — Progress Notes (Addendum)
PROGRESS NOTE    Tanner Chang  POE:[id number] DOB: 12-23-1955 DOA: 03/20/2020 [facility]: [facility]     Brief Narrative:  Tanner Chang a 65 y.o.malewith medical history significant forCOPD, chronic hypoxic respiratory failure using supplemental O2 at home on a PRN basis, hypertension, anxiety, depression, and chronic diastolic CHF, now presenting to the emergency department for evaluation of worsening shortness of breath and wheezing. Patient was admitted 1 month ago with similar symptoms, never felt that he returned to baseline in terms of shortness of breath but was able to perform all his usual activities without limitation until approximately 1 week ago when his shortness of breath began to worsen. He is also having increased wheezing. He feels that he needs to expectorate but has trouble bringing anything up and has been using steam to try to help facilitate this. Chronic bilateral leg swelling has also worsened and states that he was started on HCTZ by a physician at the [place]. Patient was admitted for acute hypoxemic respiratory failure secondary to COPD and CHF exacerbation.  New events last 24 hours / Subjective: Continues to have good UOP, 1.4L recorded overnight, down 4lbs. No new complaints, continues to have some SOB with activity. Asking for increased tylenol dose which he takes at home for headaches.   Assessment & Plan:   Principal Problem:   Acute on chronic diastolic CHF (congestive heart failure) (HCC) Active Problems:   COPD with acute exacerbation (HCC)   Essential hypertension   Acute on chronic respiratory failure with hypoxia (HCC)   Anxiety and depression   Acute on chronic hypoxemic respiratory failure -SpO2 88% in the emergency department, requiring 2 L nasal cannula O2 at this time maintain saturation >90%.  He uses 2 L oxygen as needed at home  COPD exacerbation -Continue Solu-Medrol --> prednisone, breathing treatments, azithromycin  Acute on  chronic diastolic heart failure -BNP 120.3.  Echocardiogram 02/18/2020 revealed EF 65 to 70%, grade 1 diastolic dysfunction -Continue IV Lasix -Strict I's and O's, daily weight -Stop amlodipine in setting of peripheral edema -TED hose  Hypertension -Continue losartan  Hyperlipidemia -Continue Lipitor  Depression/anxiety -Continue Wellbutrin, Prozac, Trileptal  Hypokalemia -Replace, trend      DVT prophylaxis:  Place TED hose Start: 03/22/20 0953 enoxaparin (LOVENOX) injection 40 mg Start: 03/21/20 1400  Code Status: Full code Family Communication: No family at bedside Disposition Plan:  Status is: Inpatient  Remains inpatient appropriate because:IV treatments appropriate due to intensity of illness or inability to take PO   Dispo: The patient is from: Home              Anticipated d/c is to: Home              Patient currently is not medically stable to d/c.  Remains on IV Lasix   Difficult to place patient No   Antimicrobials:  Anti-infectives (From admission, onward)   Start     Dose/Rate Route Frequency Ordered Stop   03/22/20 1000  azithromycin (ZITHROMAX) tablet 250 mg       "Followed by" Linked Group Details   250 mg Oral Daily 03/21/20 0500 03/26/20 0959   03/21/20 1000  azithromycin (ZITHROMAX) tablet 500 mg       "Followed by" Linked Group Details   500 mg Oral Daily 03/21/20 0500 03/21/20 0904       Objective: Vitals:   03/23/20 2229 03/24/20 0500 03/24/20 0557 03/24/20 0824  BP: 113/75  (!) 136/93   Pulse: 91  90  Resp: 18  18   Temp: 98.2 F (36.8 C)  98.5 F (36.9 C)   TempSrc: Oral  Oral   SpO2: 93%  92% 95%  Weight:  87.4 kg      Intake/Output Summary (Last 24 hours) at 03/24/2020 0954 Last data filed at 03/23/2020 2100 Gross per 24 hour  Intake --  Output 925 ml  Net -925 ml   Filed Weights   03/22/20 0554 03/24/20 0500  Weight: 89.3 kg 87.4 kg    Examination: General exam: Appears calm and comfortable  Respiratory  system: Clear to auscultation. Respiratory effort normal. On O2  Cardiovascular system: S1 & S2 heard, RRR. +1 bilateral pedal edema. Gastrointestinal system: Abdomen is nondistended, soft and nontender. Normal bowel sounds heard. Central nervous system: Alert and oriented. Non focal exam. Speech clear  Extremities: Symmetric in appearance bilaterally  Skin: No rashes, lesions or ulcers on exposed skin  Psychiatry: Judgement and insight appear stable. Mood & affect appropriate.    Data Reviewed: I have personally reviewed following labs and imaging studies  CBC: Recent Labs  Lab 03/20/20 2151 03/21/20 0500 03/22/20 0102  WBC 7.2 6.2 10.9*  NEUTROABS 4.4  --   --   HGB 12.5* 12.7* 12.3*  HCT 37.7* 37.5* 36.0*  MCV 88.1 85.8 86.1  PLT 232 254 271   Basic Metabolic Panel: Recent Labs  Lab 03/20/20 2151 03/21/20 0500 03/22/20 0102 03/23/20 0228 03/24/20 0144  NA 135 134* 136 138 139  K 3.9 4.5 4.5 3.5 3.6  CL 99 97* 97* 97* 96*  CO2 26 24 30 31  34*  GLUCOSE 102* 145* 203* 104* 109*  BUN 13 14 25* 34* 32*  CREATININE 0.92 0.81 1.09 1.01 1.07  CALCIUM 9.6 9.7 9.7 9.4 9.8   GFR: Estimated Creatinine Clearance: 74.1 mL/min (by C-G formula based on SCr of 1.07 mg/dL). Liver Function Tests: Recent Labs  Lab 03/20/20 2151  AST 25  ALT 28  ALKPHOS 56  BILITOT 0.9  PROT 6.5  ALBUMIN 3.8   No results for input(s): LIPASE, AMYLASE in the last 168 hours. No results for input(s): AMMONIA in the last 168 hours. Coagulation Profile: No results for input(s): INR, PROTIME in the last 168 hours. Cardiac Enzymes: No results for input(s): CKTOTAL, CKMB, CKMBINDEX, TROPONINI in the last 168 hours. BNP (last 3 results) No results for input(s): PROBNP in the last 8760 hours. HbA1C: No results for input(s): HGBA1C in the last 72 hours. CBG: Recent Labs  Lab 03/21/20 1947  GLUCAP 152*   Lipid Profile: No results for input(s): CHOL, HDL, LDLCALC, TRIG, CHOLHDL, LDLDIRECT in  the last 72 hours. Thyroid Function Tests: No results for input(s): TSH, T4TOTAL, FREET4, T3FREE, THYROIDAB in the last 72 hours. Anemia Panel: No results for input(s): VITAMINB12, FOLATE, FERRITIN, TIBC, IRON, RETICCTPCT in the last 72 hours. Sepsis Labs: No results for input(s): PROCALCITON, LATICACIDVEN in the last 168 hours.  Recent Results (from the past 240 hour(s))  Resp Panel by RT-PCR (Flu A&B, Covid) Nasopharyngeal Swab     Status: None   Collection Time: 03/20/20 10:09 PM   Specimen: Nasopharyngeal Swab; Nasopharyngeal(NP) swabs in vial transport medium  Result Value Ref Range Status   SARS Coronavirus 2 by RT PCR NEGATIVE NEGATIVE Final    Comment: (NOTE) SARS-CoV-2 target nucleic acids are NOT DETECTED.  The SARS-CoV-2 RNA is generally detectable in upper respiratory specimens during the acute phase of infection. The lowest concentration of SARS-CoV-2 viral copies this assay can detect is  138 copies/mL. A negative result does not preclude SARS-Cov-2 infection and should not be used as the sole basis for treatment or other patient management decisions. A negative result may occur with  improper specimen collection/handling, submission of specimen other than nasopharyngeal swab, presence of viral mutation(s) within the areas targeted by this assay, and inadequate number of viral copies(<138 copies/mL). A negative result must be combined with clinical observations, patient history, and epidemiological information. The expected result is Negative.  Fact Sheet for Patients:  BloggerCourse.com  Fact Sheet for Healthcare Providers:  SeriousBroker.it  This test is no t yet approved or cleared by the [place] FDA and  has been authorized for detection and/or diagnosis of SARS-CoV-2 by FDA under an Emergency Use Authorization (EUA). This EUA will remain  in effect (meaning this test can be used) for the duration of  the COVID-19 declaration under Section 564(b)(1) of the Act, 21 U.S.C.section 360bbb-3(b)(1), unless the authorization is terminated  or revoked sooner.       Influenza A by PCR NEGATIVE NEGATIVE Final   Influenza B by PCR NEGATIVE NEGATIVE Final    Comment: (NOTE) The Xpert Xpress SARS-CoV-2/FLU/RSV plus assay is intended as an aid in the diagnosis of influenza from Nasopharyngeal swab specimens and should not be used as a sole basis for treatment. Nasal washings and aspirates are unacceptable for Xpert Xpress SARS-CoV-2/FLU/RSV testing.  Fact Sheet for Patients: BloggerCourse.com  Fact Sheet for Healthcare Providers: SeriousBroker.it  This test is not yet approved or cleared by the [place] FDA and has been authorized for detection and/or diagnosis of SARS-CoV-2 by FDA under an Emergency Use Authorization (EUA). This EUA will remain in effect (meaning this test can be used) for the duration of the COVID-19 declaration under Section 564(b)(1) of the Act, 21 U.S.C. section 360bbb-3(b)(1), unless the authorization is terminated or revoked.  Performed at [facility], [address], [place], [place] [address]       Radiology Studies: No results found.    Scheduled Meds: . atorvastatin  10 mg Oral QHS  . azithromycin  250 mg Oral Daily  . buPROPion  300 mg Oral Q breakfast  . enoxaparin (LOVENOX) injection  40 mg Subcutaneous Q24H  . FLUoxetine  20 mg Oral Daily  . furosemide  40 mg Intravenous BID  . Ipratropium-Albuterol  2 puff Inhalation Daily  . Living Better with Heart Failure Book   Does not apply Once  . losartan  100 mg Oral Daily  . mometasone-formoterol  2 puff Inhalation BID  . Oxcarbazepine  300 mg Oral BID  . pantoprazole  40 mg Oral Daily  . predniSONE  40 mg Oral Q breakfast   Continuous Infusions: . sodium chloride       LOS: 3 days      Time spent: 25 minutes   Noralee Stain,  DO [facility] 03/24/2020, 9:54 AM   Available via Epic secure chat 7am-7pm After these hours, please refer to coverage provider listed on [email or web address]

## 2020-03-25 LAB — BASIC METABOLIC PANEL
Anion gap: 7 (ref 5–15)
BUN: 27 mg/dL — ABNORMAL HIGH (ref 8–23)
CO2: 34 mmol/L — ABNORMAL HIGH (ref 22–32)
Calcium: 9.9 mg/dL (ref 8.9–10.3)
Chloride: 96 mmol/L — ABNORMAL LOW (ref 98–111)
Creatinine, Ser: 1.07 mg/dL (ref 0.61–1.24)
GFR, Estimated: >60 mL/min (ref >60)
Glucose, Bld: 90 mg/dL (ref 70–99)
Potassium: 3.1 mmol/L — ABNORMAL LOW (ref 3.5–5.1)
Sodium: 137 mmol/L (ref 135–145)

## 2020-03-25 MED ORDER — POTASSIUM CHLORIDE CRYS ER 20 MEQ PO TBCR
40.0000 meq | EXTENDED_RELEASE_TABLET | Freq: Two times a day (BID) | ORAL | Status: AC
  Administered 2020-03-25 (×2): 40 meq via ORAL
  Filled 2020-03-25 (×2): qty 2

## 2020-03-25 NOTE — Progress Notes (Signed)
PROGRESS NOTE    Tanner Chang  UVO:[id number] DOB: 1955-02-03 DOA: 03/20/2020 [facility]: [facility]     Brief Narrative:  Tanner Chang a 65 y.o.malewith medical history significant forCOPD, chronic hypoxic respiratory failure using supplemental O2 at home on a PRN basis, hypertension, anxiety, depression, and chronic diastolic CHF, now presenting to the emergency department for evaluation of worsening shortness of breath and wheezing. Patient was admitted 1 month ago with similar symptoms, never felt that he returned to baseline in terms of shortness of breath but was able to perform all his usual activities without limitation until approximately 1 week ago when his shortness of breath began to worsen. He is also having increased wheezing. He feels that he needs to expectorate but has trouble bringing anything up and has been using steam to try to help facilitate this. Chronic bilateral leg swelling has also worsened and states that he was started on HCTZ by a physician at the [place]. Patient was admitted for acute hypoxemic respiratory failure secondary to COPD and CHF exacerbation.  New events last 24 hours / Subjective: Urine output 1.4 L documented last 24 hours, down 2.6 pounds.  He has been keeping his legs elevated, has noticed significant improvement in his peripheral edema.  Assessment & Plan:   Principal Problem:   Acute on chronic diastolic CHF (congestive heart failure) (HCC) Active Problems:   COPD with acute exacerbation (HCC)   Essential hypertension   Acute on chronic respiratory failure with hypoxia (HCC)   Anxiety and depression   Acute on chronic hypoxemic respiratory failure -SpO2 88% in the emergency department, requiring 2 L nasal cannula O2 at this time maintain saturation >90%.  He uses 2 L oxygen as needed at home  COPD exacerbation -Continue Solu-Medrol --> prednisone, breathing treatments, azithromycin  Acute on chronic diastolic heart failure -BNP  120.3.  Echocardiogram 02/18/2020 revealed EF 65 to 70%, grade 1 diastolic dysfunction -Continue IV Lasix, plan to transition to p.o. Lasix tomorrow -Strict I's and O's, daily weight -Stop amlodipine in setting of peripheral edema -TED hose  Hypertension -Continue losartan  Hyperlipidemia -Continue Lipitor  Depression/anxiety -Continue Wellbutrin, Prozac, Trileptal  Hypokalemia -Replace, trend      DVT prophylaxis:  Place TED hose Start: 03/22/20 0953 enoxaparin (LOVENOX) injection 40 mg Start: 03/21/20 1400  Code Status: Full code Family Communication: No family at bedside Disposition Plan:  Status is: Inpatient  Remains inpatient appropriate because:IV treatments appropriate due to intensity of illness or inability to take PO   Dispo: The patient is from: Home              Anticipated d/c is to: Home              Patient currently is not medically stable to d/c.  Remains on IV Lasix.  Likely discharge 3/14 on oral diuretic   Difficult to place patient No   Antimicrobials:  Anti-infectives (From admission, onward)   Start     Dose/Rate Route Frequency Ordered Stop   03/22/20 1000  azithromycin (ZITHROMAX) tablet 250 mg       "Followed by" Linked Group Details   250 mg Oral Daily 03/21/20 0500 03/25/20 0857   03/21/20 1000  azithromycin (ZITHROMAX) tablet 500 mg       "Followed by" Linked Group Details   500 mg Oral Daily 03/21/20 0500 03/21/20 0904       Objective: Vitals:   03/24/20 2128 03/25/20 0500 03/25/20 0542 03/25/20 0851  BP: 98/71  121/85  Pulse: 79  88   Resp: 16  18   Temp: 98.3 F (36.8 C)  98.6 F (37 C)   TempSrc: Oral  Oral   SpO2: 92%  92% 96%  Weight:  86.2 kg      Intake/Output Summary (Last 24 hours) at 03/25/2020 1004 Last data filed at 03/25/2020 3762 Gross per 24 hour  Intake 420 ml  Output 1400 ml  Net -980 ml   Filed Weights   03/22/20 0554 03/24/20 0500 03/25/20 0500  Weight: 89.3 kg 87.4 kg 86.2 kg     Examination: General exam: Appears calm and comfortable  Respiratory system: Clear to auscultation. Respiratory effort normal.  On nasal cannula O2 Cardiovascular system: S1 & S2 heard, RRR. +1 pedal edema. Gastrointestinal system: Abdomen is nondistended, soft and nontender. Normal bowel sounds heard. Central nervous system: Alert and oriented. Non focal exam. Speech clear  Extremities: Symmetric in appearance bilaterally  Skin: No rashes, lesions or ulcers on exposed skin  Psychiatry: Judgement and insight appear stable. Mood & affect appropriate.    Data Reviewed: I have personally reviewed following labs and imaging studies  CBC: Recent Labs  Lab 03/20/20 2151 03/21/20 0500 03/22/20 0102  WBC 7.2 6.2 10.9*  NEUTROABS 4.4  --   --   HGB 12.5* 12.7* 12.3*  HCT 37.7* 37.5* 36.0*  MCV 88.1 85.8 86.1  PLT 232 254 271   Basic Metabolic Panel: Recent Labs  Lab 03/21/20 0500 03/22/20 0102 03/23/20 0228 03/24/20 0144 03/25/20 0441  NA 134* 136 138 139 137  K 4.5 4.5 3.5 3.6 3.1*  CL 97* 97* 97* 96* 96*  CO2 24 30 31  34* 34*  GLUCOSE 145* 203* 104* 109* 90  BUN 14 25* 34* 32* 27*  CREATININE 0.81 1.09 1.01 1.07 1.07  CALCIUM 9.7 9.7 9.4 9.8 9.9   GFR: Estimated Creatinine Clearance: 73.7 mL/min (by C-G formula based on SCr of 1.07 mg/dL). Liver Function Tests: Recent Labs  Lab 03/20/20 2151  AST 25  ALT 28  ALKPHOS 56  BILITOT 0.9  PROT 6.5  ALBUMIN 3.8   No results for input(s): LIPASE, AMYLASE in the last 168 hours. No results for input(s): AMMONIA in the last 168 hours. Coagulation Profile: No results for input(s): INR, PROTIME in the last 168 hours. Cardiac Enzymes: No results for input(s): CKTOTAL, CKMB, CKMBINDEX, TROPONINI in the last 168 hours. BNP (last 3 results) No results for input(s): PROBNP in the last 8760 hours. HbA1C: No results for input(s): HGBA1C in the last 72 hours. CBG: Recent Labs  Lab 03/21/20 1947  GLUCAP 152*   Lipid  Profile: No results for input(s): CHOL, HDL, LDLCALC, TRIG, CHOLHDL, LDLDIRECT in the last 72 hours. Thyroid Function Tests: No results for input(s): TSH, T4TOTAL, FREET4, T3FREE, THYROIDAB in the last 72 hours. Anemia Panel: No results for input(s): VITAMINB12, FOLATE, FERRITIN, TIBC, IRON, RETICCTPCT in the last 72 hours. Sepsis Labs: No results for input(s): PROCALCITON, LATICACIDVEN in the last 168 hours.  Recent Results (from the past 240 hour(s))  Resp Panel by RT-PCR (Flu A&B, Covid) Nasopharyngeal Swab     Status: None   Collection Time: 03/20/20 10:09 PM   Specimen: Nasopharyngeal Swab; Nasopharyngeal(NP) swabs in vial transport medium  Result Value Ref Range Status   SARS Coronavirus 2 by RT PCR NEGATIVE NEGATIVE Final    Comment: (NOTE) SARS-CoV-2 target nucleic acids are NOT DETECTED.  The SARS-CoV-2 RNA is generally detectable in upper respiratory specimens during the acute phase of infection.  The lowest concentration of SARS-CoV-2 viral copies this assay can detect is 138 copies/mL. A negative result does not preclude SARS-Cov-2 infection and should not be used as the sole basis for treatment or other patient management decisions. A negative result may occur with  improper specimen collection/handling, submission of specimen other than nasopharyngeal swab, presence of viral mutation(s) within the areas targeted by this assay, and inadequate number of viral copies(<138 copies/mL). A negative result must be combined with clinical observations, patient history, and epidemiological information. The expected result is Negative.  Fact Sheet for Patients:  BloggerCourse.com  Fact Sheet for Healthcare Providers:  SeriousBroker.it  This test is no t yet approved or cleared by the [place] FDA and  has been authorized for detection and/or diagnosis of SARS-CoV-2 by FDA under an Emergency Use Authorization (EUA). This EUA  will remain  in effect (meaning this test can be used) for the duration of the COVID-19 declaration under Section 564(b)(1) of the Act, 21 U.S.C.section 360bbb-3(b)(1), unless the authorization is terminated  or revoked sooner.       Influenza A by PCR NEGATIVE NEGATIVE Final   Influenza B by PCR NEGATIVE NEGATIVE Final    Comment: (NOTE) The Xpert Xpress SARS-CoV-2/FLU/RSV plus assay is intended as an aid in the diagnosis of influenza from Nasopharyngeal swab specimens and should not be used as a sole basis for treatment. Nasal washings and aspirates are unacceptable for Xpert Xpress SARS-CoV-2/FLU/RSV testing.  Fact Sheet for Patients: BloggerCourse.com  Fact Sheet for Healthcare Providers: SeriousBroker.it  This test is not yet approved or cleared by the [place] FDA and has been authorized for detection and/or diagnosis of SARS-CoV-2 by FDA under an Emergency Use Authorization (EUA). This EUA will remain in effect (meaning this test can be used) for the duration of the COVID-19 declaration under Section 564(b)(1) of the Act, 21 U.S.C. section 360bbb-3(b)(1), unless the authorization is terminated or revoked.  Performed at [facility], [address], [place], [place] [address]       Radiology Studies: No results found.    Scheduled Meds: . atorvastatin  10 mg Oral QHS  . buPROPion  300 mg Oral Q breakfast  . enoxaparin (LOVENOX) injection  40 mg Subcutaneous Q24H  . FLUoxetine  20 mg Oral Daily  . furosemide  40 mg Intravenous BID  . Ipratropium-Albuterol  2 puff Inhalation Daily  . Living Better with Heart Failure Book   Does not apply Once  . losartan  100 mg Oral Daily  . mometasone-formoterol  2 puff Inhalation BID  . Oxcarbazepine  300 mg Oral BID  . pantoprazole  40 mg Oral Daily  . potassium chloride  40 mEq Oral BID   Continuous Infusions: . sodium chloride       LOS: 4 days       Time spent: 25 minutes   Noralee Stain, DO [facility] 03/25/2020, 10:04 AM   Available via Epic secure chat 7am-7pm After these hours, please refer to coverage provider listed on [email or web address]

## 2020-03-26 LAB — BASIC METABOLIC PANEL
Anion gap: 8 (ref 5–15)
BUN: 32 mg/dL — ABNORMAL HIGH (ref 8–23)
CO2: 34 mmol/L — ABNORMAL HIGH (ref 22–32)
Calcium: 9.9 mg/dL (ref 8.9–10.3)
Chloride: 98 mmol/L (ref 98–111)
Creatinine, Ser: 1.16 mg/dL (ref 0.61–1.24)
GFR, Estimated: >60 mL/min (ref >60)
Glucose, Bld: 95 mg/dL (ref 70–99)
Potassium: 4.4 mmol/L (ref 3.5–5.1)
Sodium: 140 mmol/L (ref 135–145)

## 2020-03-26 MED ORDER — FUROSEMIDE 40 MG PO TABS: 40.0000 mg | ORAL_TABLET | Freq: Every day | ORAL | 2 refills | Status: AC

## 2020-03-26 MED ORDER — FUROSEMIDE 40 MG PO TABS
40.0000 mg | ORAL_TABLET | Freq: Every day | ORAL | Status: DC
Start: 2020-03-26 — End: 2020-03-26
  Administered 2020-03-26: 40 mg via ORAL
  Filled 2020-03-26: qty 1

## 2020-03-26 NOTE — Progress Notes (Signed)
SATURATION QUALIFICATIONS: (This note is used to comply with regulatory documentation for home oxygen)  Patient Saturations on Room Air at Rest = 89%  Patient Saturations on Room Air while Ambulating = 87%  Patient Saturations on 2 Liters of oxygen while Ambulating = 93%  Please briefly explain why patient needs home oxygen: pts o2 dipped with ambulation. No c/o chest pain or dizziness. Pt was able to walk the entire time without resting, however was sob when he completed the walk. O2 on 2L at rest was 93% with a recovery of approx 30 seconds

## 2020-03-26 NOTE — Discharge Summary (Signed)
Physician Discharge Summary  Tanner Chang EQA:[id number] DOB: 1955/11/26 DOA: 03/20/2020  PCP: [facility]  Admit date: 03/20/2020 Discharge date: 03/26/2020  Admitted From: Home Disposition:  Home  Recommendations for Outpatient Follow-up:  1. Follow up with PCP in 1 week  Discharge Condition: Stable CODE STATUS: Full  Diet recommendation: Heart healthy diet   Brief/Interim Summary: Tanner Pilcheris a 65 y.o.malewith medical history significant forCOPD, chronic hypoxic respiratory failureusing supplemental O2 at home on a PRN basis, hypertension, anxiety, depression, and chronic diastolic CHF, now presenting to the emergency department for evaluation of worsening shortness of breath and wheezing. Patient was admitted 1 month ago with similar symptoms, never felt that he returned to baseline in terms of shortness of breath but was able to perform all his usual activities without limitation until approximately 1 week ago when his shortness of breath began to worsen. He is also having increased wheezing. He feels that he needs to expectorate but has trouble bringing anything up and has been using steam to try to help facilitate this. Chronic bilateral leg swelling has also worsened and states that he was started on HCTZ by a physician at the [place].Patient was admitted for acute hypoxemic respiratory failure secondary to COPD and CHF exacerbation.  Patient completed treatment of steroids and empiric antibiotics.  He was diuresed with IV Lasix and made great improvement in his respiratory status and peripheral edema.  Patient was discharged home in stable and improved condition on oral Lasix.  Discharge Diagnoses:  Principal Problem:   Acute on chronic diastolic CHF (congestive heart failure) (HCC) Active Problems:   COPD with acute exacerbation (HCC)   Essential hypertension   Acute on chronic respiratory failure with hypoxia (HCC)   Anxiety and depression   Acute on chronic  hypoxemic respiratory failure -SpO2 88% in the emergency department, requiring 2 L nasal cannula O2 at this time maintain saturation >90%. He uses 2 L oxygen as needed at home  COPD exacerbation -Completed prednisone, azithromycin treatment  Acute on chronic diastolic heart failure -BNP 120.3. Echocardiogram 02/18/2020 revealed EF 65 to 70%, grade 1 diastolic dysfunction -Strict I's and O's, daily weight -Stop amlodipine in setting of peripheral edema -TED hose -IV Lasix --> p.o. Lasix.  Discussed fluid restriction, sodium restriction diet and to continue to monitor weight at home  Hypertension -Continue losartan  Hyperlipidemia -Continue Lipitor  Depression/anxiety -Continue Wellbutrin, Prozac, Trileptal      Discharge Instructions  Discharge Instructions    (HEART FAILURE PATIENTS) Call MD:  Anytime you have any of the following symptoms: 1) 3 pound weight gain in 24 hours or 5 pounds in 1 week 2) shortness of breath, with or without a dry hacking cough 3) swelling in the hands, feet or stomach 4) if you have to sleep on extra pillows at night in order to breathe.   Complete by: As directed    Call MD for:  difficulty breathing, headache or visual disturbances   Complete by: As directed    Call MD for:  extreme fatigue   Complete by: As directed    Call MD for:  persistant dizziness or light-headedness   Complete by: As directed    Call MD for:  persistant nausea and vomiting   Complete by: As directed    Call MD for:  severe uncontrolled pain   Complete by: As directed    Call MD for:  temperature >100.4   Complete by: As directed    Diet - low sodium heart healthy  Complete by: As directed    Discharge instructions   Complete by: As directed    You were cared for by a hospitalist during your hospital stay. If you have any questions about your discharge medications or the care you received while you were in the hospital after you are discharged, you can call  the unit and ask to speak with the hospitalist on call if the hospitalist that took care of you is not available. Once you are discharged, your primary care physician will handle any further medical issues. Please note that NO REFILLS for any discharge medications will be authorized once you are discharged, as it is imperative that you return to your primary care physician (or establish a relationship with a primary care physician if you do not have one) for your aftercare needs so that they can reassess your need for medications and monitor your lab values.   Increase activity slowly   Complete by: As directed      Allergies as of 03/26/2020      Reactions   Penicillins Other (See Comments)   From childhood- reaction not recalled   Sulfamethoxazole-trimethoprim Swelling, Other (See Comments)   Tongue swells      Medication List    STOP taking these medications   amLODipine 5 MG tablet Commonly known as: NORVASC     TAKE these medications   acetaminophen 500 MG tablet Commonly known as: TYLENOL Take 1,000 mg by mouth every 6 (six) hours as needed for mild pain or headache.   albuterol 108 (90 Base) MCG/ACT inhaler Commonly known as: VENTOLIN HFA Inhale 2 puffs into the lungs every 4 (four) hours as needed for wheezing or shortness of breath.   atorvastatin 10 MG tablet Commonly known as: LIPITOR Take 10 mg by mouth at bedtime.   BC HEADACHE POWDER PO Take 2 packets by mouth daily as needed (headaches).   budesonide-formoterol 160-4.5 MCG/ACT inhaler Commonly known as: SYMBICORT Inhale 2 puffs into the lungs 2 (two) times daily.   buPROPion 300 MG 24 hr tablet Commonly known as: WELLBUTRIN XL Take 300 mg by mouth daily with breakfast.   Combivent Respimat 20-100 MCG/ACT Aers respimat Generic drug: Ipratropium-Albuterol Inhale 2 puffs into the lungs daily.   FLUoxetine 20 MG capsule Commonly known as: PROZAC Take 20 mg by mouth in the morning.   furosemide 40 MG  tablet Commonly known as: LASIX Take 1 tablet (40 mg total) by mouth daily. Start taking on: March 27, 2020   losartan 100 MG tablet Commonly known as: COZAAR Take 100 mg by mouth in the morning.   METAMUCIL PO Take by mouth See admin instructions. Mix 1 tablespoonful of powder into 8 ounces of water and drink once a day as needed for constipation   omeprazole 20 MG capsule Commonly known as: PRILOSEC Take 20 mg by mouth daily before breakfast.   One-A-Day Mens 50+ Advantage Tabs Take 1 tablet by mouth daily with breakfast.   OVER THE COUNTER MEDICATION Take 1 tablet by mouth daily. Vitamin d, vitamin c, zinc combo vitamin   Oxcarbazepine 300 MG tablet Commonly known as: TRILEPTAL Take 300 mg by mouth 2 (two) times daily.            Durable Medical Equipment  (From admission, onward)         Start     Ordered   03/26/20 0904  For home use only DME oxygen  Once       Comments: POC evaluation  Question Answer Comment  Length of Need Lifetime   Mode or (Route) Nasal cannula   Liters per Minute 2   Frequency Continuous (stationary and portable oxygen unit needed)   Oxygen conserving device Yes   Oxygen delivery system Gas      03/26/20 0904          Follow-up Information    [facility] Follow up.   Why: Dr Magdalene Patricia office will call you to schedule your hospital discharge appointment.  Contact information: [address] [facility] [place] [address] [phone]              Allergies  Allergen Reactions  . Penicillins Other (See Comments)    From childhood- reaction not recalled  . Sulfamethoxazole-Trimethoprim Swelling and Other (See Comments)    Tongue swells    Consultations:  None    Procedures/Studies: DG Chest 2 View  Result Date: 03/20/2020 CLINICAL DATA:  Dyspnea on exertion for several months, COPD, hypertension, tobacco abuse EXAM: CHEST - 2 VIEW COMPARISON:  02/18/2020 FINDINGS: Frontal and lateral views  of the chest demonstrate a stable cardiac silhouette. Chronic elevation of the left hemidiaphragm. Diffuse interstitial prominence consistent with given history of emphysema. No airspace disease, effusion, or pneumothorax. No acute bony abnormalities. IMPRESSION: 1. Stable emphysema.  No acute process. 2. Chronic elevation left hemidiaphragm. Electronically Signed   By: Sharlet Salina M.D.   On: 03/20/2020 19:22      Discharge Exam: Vitals:   03/26/20 0515 03/26/20 0730  BP: 129/89   Pulse: 77   Resp: 20 18  Temp: 98 F (36.7 C)   SpO2: 95%     General: Pt is alert, awake, not in acute distress Cardiovascular: RRR, S1/S2 +, +1 edema greatly improved since admission Respiratory: CTA bilaterally, no wheezing, no rhonchi, no respiratory distress, no conversational dyspnea, on nasal cannula O2 Abdominal: Soft, NT, ND, bowel sounds + Extremities: no edema, no cyanosis Psych: Normal mood and affect, stable judgement and insight     The results of significant diagnostics from this hospitalization (including imaging, microbiology, ancillary and laboratory) are listed below for reference.     Microbiology: Recent Results (from the past 240 hour(s))  Resp Panel by RT-PCR (Flu A&B, Covid) Nasopharyngeal Swab     Status: None   Collection Time: 03/20/20 10:09 PM   Specimen: Nasopharyngeal Swab; Nasopharyngeal(NP) swabs in vial transport medium  Result Value Ref Range Status   SARS Coronavirus 2 by RT PCR NEGATIVE NEGATIVE Final    Comment: (NOTE) SARS-CoV-2 target nucleic acids are NOT DETECTED.  The SARS-CoV-2 RNA is generally detectable in upper respiratory specimens during the acute phase of infection. The lowest concentration of SARS-CoV-2 viral copies this assay can detect is 138 copies/mL. A negative result does not preclude SARS-Cov-2 infection and should not be used as the sole basis for treatment or other patient management decisions. A negative result may occur with  improper  specimen collection/handling, submission of specimen other than nasopharyngeal swab, presence of viral mutation(s) within the areas targeted by this assay, and inadequate number of viral copies(<138 copies/mL). A negative result must be combined with clinical observations, patient history, and epidemiological information. The expected result is Negative.  Fact Sheet for Patients:  BloggerCourse.com  Fact Sheet for Healthcare Providers:  SeriousBroker.it  This test is no t yet approved or cleared by the [place] FDA and  has been authorized for detection and/or diagnosis of SARS-CoV-2 by FDA under an Emergency Use Authorization (EUA). This EUA  will remain  in effect (meaning this test can be used) for the duration of the COVID-19 declaration under Section 564(b)(1) of the Act, 21 U.S.C.section 360bbb-3(b)(1), unless the authorization is terminated  or revoked sooner.       Influenza A by PCR NEGATIVE NEGATIVE Final   Influenza B by PCR NEGATIVE NEGATIVE Final    Comment: (NOTE) The Xpert Xpress SARS-CoV-2/FLU/RSV plus assay is intended as an aid in the diagnosis of influenza from Nasopharyngeal swab specimens and should not be used as a sole basis for treatment. Nasal washings and aspirates are unacceptable for Xpert Xpress SARS-CoV-2/FLU/RSV testing.  Fact Sheet for Patients: BloggerCourse.comhttps://www.fda.gov/media/152166/download  Fact Sheet for Healthcare Providers: SeriousBroker.ithttps://www.fda.gov/media/152162/download  This test is not yet approved or cleared by the [place] FDA and has been authorized for detection and/or diagnosis of SARS-CoV-2 by FDA under an Emergency Use Authorization (EUA). This EUA will remain in effect (meaning this test can be used) for the duration of the COVID-19 declaration under Section 564(b)(1) of the Act, 21 U.S.C. section 360bbb-3(b)(1), unless the authorization is terminated or revoked.  Performed at  [facility], [address] [place], [place] [address]      Labs: BNP (last 3 results) Recent Labs    12/14/19 1515 03/20/20 2151  BNP 24.4 120.3*   Basic Metabolic Panel: Recent Labs  Lab 03/22/20 0102 03/23/20 0228 03/24/20 0144 03/25/20 0441 03/26/20 0308  NA 136 138 139 137 140  K 4.5 3.5 3.6 3.1* 4.4  CL 97* 97* 96* 96* 98  CO2 30 31 34* 34* 34*  GLUCOSE 203* 104* 109* 90 95  BUN 25* 34* 32* 27* 32*  CREATININE 1.09 1.01 1.07 1.07 1.16  CALCIUM 9.7 9.4 9.8 9.9 9.9   Liver Function Tests: Recent Labs  Lab 03/20/20 2151  AST 25  ALT 28  ALKPHOS 56  BILITOT 0.9  PROT 6.5  ALBUMIN 3.8   No results for input(s): LIPASE, AMYLASE in the last 168 hours. No results for input(s): AMMONIA in the last 168 hours. CBC: Recent Labs  Lab 03/20/20 2151 03/21/20 0500 03/22/20 0102  WBC 7.2 6.2 10.9*  NEUTROABS 4.4  --   --   HGB 12.5* 12.7* 12.3*  HCT 37.7* 37.5* 36.0*  MCV 88.1 85.8 86.1  PLT 232 254 271   Cardiac Enzymes: No results for input(s): CKTOTAL, CKMB, CKMBINDEX, TROPONINI in the last 168 hours. BNP: Invalid input(s): POCBNP CBG: Recent Labs  Lab 03/21/20 1947  GLUCAP 152*   D-Dimer No results for input(s): DDIMER in the last 72 hours. Hgb A1c No results for input(s): HGBA1C in the last 72 hours. Lipid Profile No results for input(s): CHOL, HDL, LDLCALC, TRIG, CHOLHDL, LDLDIRECT in the last 72 hours. Thyroid function studies No results for input(s): TSH, T4TOTAL, T3FREE, THYROIDAB in the last 72 hours.  Invalid input(s): FREET3 Anemia work up No results for input(s): VITAMINB12, FOLATE, FERRITIN, TIBC, IRON, RETICCTPCT in the last 72 hours. Urinalysis No results found for: COLORURINE, APPEARANCEUR, LABSPEC, PHURINE, GLUCOSEU, HGBUR, BILIRUBINUR, KETONESUR, PROTEINUR, UROBILINOGEN, NITRITE, LEUKOCYTESUR Sepsis Labs Invalid input(s): PROCALCITONIN,  WBC,  LACTICIDVEN Microbiology Recent Results (from the past 240 hour(s))  Resp  Panel by RT-PCR (Flu A&B, Covid) Nasopharyngeal Swab     Status: None   Collection Time: 03/20/20 10:09 PM   Specimen: Nasopharyngeal Swab; Nasopharyngeal(NP) swabs in vial transport medium  Result Value Ref Range Status   SARS Coronavirus 2 by RT PCR NEGATIVE NEGATIVE Final    Comment: (NOTE) SARS-CoV-2 target  nucleic acids are NOT DETECTED.  The SARS-CoV-2 RNA is generally detectable in upper respiratory specimens during the acute phase of infection. The lowest concentration of SARS-CoV-2 viral copies this assay can detect is 138 copies/mL. A negative result does not preclude SARS-Cov-2 infection and should not be used as the sole basis for treatment or other patient management decisions. A negative result may occur with  improper specimen collection/handling, submission of specimen other than nasopharyngeal swab, presence of viral mutation(s) within the areas targeted by this assay, and inadequate number of viral copies(<138 copies/mL). A negative result must be combined with clinical observations, patient history, and epidemiological information. The expected result is Negative.  Fact Sheet for Patients:  BloggerCourse.com  Fact Sheet for Healthcare Providers:  SeriousBroker.it  This test is no t yet approved or cleared by the [place] FDA and  has been authorized for detection and/or diagnosis of SARS-CoV-2 by FDA under an Emergency Use Authorization (EUA). This EUA will remain  in effect (meaning this test can be used) for the duration of the COVID-19 declaration under Section 564(b)(1) of the Act, 21 U.S.C.section 360bbb-3(b)(1), unless the authorization is terminated  or revoked sooner.       Influenza A by PCR NEGATIVE NEGATIVE Final   Influenza B by PCR NEGATIVE NEGATIVE Final    Comment: (NOTE) The Xpert Xpress SARS-CoV-2/FLU/RSV plus assay is intended as an aid in the diagnosis of influenza from Nasopharyngeal  swab specimens and should not be used as a sole basis for treatment. Nasal washings and aspirates are unacceptable for Xpert Xpress SARS-CoV-2/FLU/RSV testing.  Fact Sheet for Patients: BloggerCourse.com  Fact Sheet for Healthcare Providers: SeriousBroker.it  This test is not yet approved or cleared by the [place] FDA and has been authorized for detection and/or diagnosis of SARS-CoV-2 by FDA under an Emergency Use Authorization (EUA). This EUA will remain in effect (meaning this test can be used) for the duration of the COVID-19 declaration under Section 564(b)(1) of the Act, 21 U.S.C. section 360bbb-3(b)(1), unless the authorization is terminated or revoked.  Performed at [facility], [address], [place], [place] [address]      Patient was seen and examined on the day of discharge and was found to be in stable condition. Time coordinating discharge: 35 minutes including assessment and coordination of care, as well as examination of the patient.   SIGNED:  Noralee Stain, DO [facility] 03/26/2020, 10:18 AM

## 2020-03-26 NOTE — Discharge Instructions (Signed)
Heart Failure Eating Plan Heart failure, also called congestive heart failure, occurs when your heart does not pump blood well enough to meet your body's needs for oxygen-rich blood. Heart failure is a long-term (chronic) condition. Living with heart failure can be challenging. Following your health care provider's instructions about a healthy lifestyle and working with a dietitian to choose the right foods may help to improve your symptoms. An eating plan for someone with heart failure will include changes that limit the intake of salt (sodium) and unhealthy fat. What are tips for following this plan? Reading food labels  Check food labels for the amount of sodium per serving. Choose foods that have less than 140 mg (milligrams) of sodium in each serving.  Check food labels for the number of calories per serving. This is important if you need to limit your daily calorie intake to lose weight.  Check food labels for the serving size. If you eat more than one serving, you will be eating more sodium and calories than what is listed on the label.  Look for foods that are labeled as "sodium-free," "very low sodium," or "low sodium." ? Foods labeled as "reduced sodium" or "lightly salted" may still have more sodium than what is recommended for you. Cooking  Avoid adding salt when cooking. Ask your health care provider or dietitian before using salt substitutes.  Season food with salt-free seasonings, spices, or herbs. Check the label of seasoning mixes to make sure they do not contain salt.  Cook with heart-healthy oils, such as olive, canola, soybean, or sunflower oil.  Do not fry foods. Cook foods using low-fat methods, such as baking, boiling, grilling, and broiling.  Limit unhealthy fats when cooking by: ? Removing the skin from poultry, such as chicken. ? Removing all visible fats from meats. ? Skimming the fat off from stews, soups, and gravies before serving them. Meal planning  Limit  your intake of: ? Processed, canned, or prepackaged foods. ? Foods that are high in trans fat, such as fried foods. ? Sweets, desserts, sugary drinks, and other foods with added sugar. ? Full-fat dairy products, such as whole milk.  Eat a balanced diet. This may include: ? 4-5 servings of fruit each day and 4-5 servings of vegetables each day. At each meal, try to fill one-half of your plate with fruits and vegetables. ? Up to 6-8 servings of whole grains each day. ? Up to 2 servings of lean meat, poultry, or fish each day. One serving of meat is equal to 3 oz (85 g). This is about the same size as a deck of cards. ? 2 servings of low-fat dairy each day. ? Heart-healthy fats. Healthy fats called omega-3 fatty acids are found in foods such as flaxseed and cold-water fish like sardines, salmon, and mackerel.  Aim to eat 25-35 g (grams) of fiber a day. Foods that are high in fiber include apples, broccoli, carrots, beans, peas, and whole grains.  Do not add salt or condiments that contain salt (such as soy sauce) to foods before eating.  When eating at a restaurant, ask that your food be prepared with less salt or no salt, if possible.  Try to eat 2 or more vegetarian meals each week.  Eat more home-cooked food and eat less restaurant, buffet, and fast food.   General information  Do not eat more than 2,300 mg of sodium a day. The amount of sodium that is recommended for you may be lower, depending on your   condition.  Maintain a healthy body weight as directed. Ask your health care provider what a healthy weight is for you. ? Check your weight every day. ? Work with your health care provider and dietitian to make a plan that is right for you to lose weight or maintain your current weight.  Limit how much fluid you drink. Ask your health care provider or dietitian how much fluid you can have each day.  Limit or avoid alcohol as told by your health care provider or dietitian. Recommended  foods Fruits All fresh, frozen, and canned fruits. Dried fruits, such as raisins, prunes, and cranberries. Vegetables All fresh vegetables. Vegetables that are frozen without sauce or added salt. Low-sodium or sodium-free canned vegetables. Grains Bread with less than 80 mg of sodium per slice. Whole-wheat pasta, quinoa, and brown rice. Oats and oatmeal. Barley. Millet. Grits and cream of wheat. Whole-grain and whole-wheat cold cereal. Meats and other protein foods Lean cuts of meat. Skinless chicken and turkey. Fish with high omega-3 fatty acids, such as salmon, sardines, and other cold-water fishes. Eggs. Dried beans, peas, and edamame. Unsalted nuts and nut butters. Dairy Low-fat or nonfat (skim) milk and dried milk. Rice milk, soy milk, and almond milk. Low-fat or nonfat yogurt. Small amounts of reduced-sodium block cheese. Low-sodium cottage cheese. Fats and oils Olive, canola, soybean, flaxseed, avocado, or sunflower oil. Sweets and desserts Applesauce. Granola bars. Sugar-free pudding and gelatin. Frozen fruit bars. Seasoning and other foods Fresh and dried herbs. Lemon or lime juice. Vinegar. Low-sodium ketchup. Salt-free marinades, salad dressings, sauces, and seasonings. The items listed above may not be a complete list of foods and beverages you can eat. Contact a dietitian for more information. Foods to avoid Fruits Fruits that are dried with sodium-containing preservatives. Vegetables Canned vegetables. Frozen vegetables with sauce or seasonings. Creamed vegetables. French fries. Onion rings. Pickled vegetables and sauerkraut. Grains Bread with more than 80 mg of sodium per slice. Hot or cold cereal with more than 140 mg sodium per serving. Salted pretzels and crackers. Prepackaged breadcrumbs. Bagels, croissants, and biscuits. Meats and other protein foods Ribs and chicken wings. Bacon, ham, pepperoni, bologna, salami, and packaged luncheon meats. Hot dogs, bratwurst, and  sausage. Canned meat. Smoked meat and fish. Salted nuts and seeds. Dairy Whole milk, half-and-half, and cream. Buttermilk. Processed cheese, cheese spreads, and cheese curds. Regular cottage cheese. Feta cheese. Shredded cheese. String cheese. Fats and oils Butter, lard, shortening, ghee, and bacon fat. Canned and packaged gravies. Seasoning and other foods Onion salt, garlic salt, table salt, and sea salt. Marinades. Regular salad dressings. Relishes, pickles, and olives. Meat flavorings and tenderizers, and bouillon cubes. Horseradish, ketchup, and mustard. Worcestershire sauce. Teriyaki sauce, soy sauce (including reduced sodium). Hot sauce and Tabasco sauce. Steak sauce, fish sauce, oyster sauce, and cocktail sauce. Taco seasonings. Barbecue sauce. Tartar sauce. The items listed above may not be a complete list of foods and beverages you should avoid. Contact a dietitian for more information. Summary  A heart failure eating plan includes changes that limit your intake of sodium and unhealthy fat, and it may help you lose weight or maintain a healthy weight. Your health care provider may also recommend limiting how much fluid you drink.  Most people with heart failure should eat no more than 2,300 mg of salt (sodium) a day. The amount of sodium that is recommended for you may be lower, depending on your condition.  Contact your health care provider or dietitian before making any major   changes to your diet. This information is not intended to replace advice given to you by your health care provider. Make sure you discuss any questions you have with your health care provider. Document Revised: 08/15/2019 Document Reviewed: 08/15/2019 Elsevier Patient Education  2021 Elsevier Inc.  

## 2020-03-26 NOTE — TOC Transition Note (Signed)
Transition of Care [facility]) - CM/SW Discharge Note   Patient Details  Name: Tanner Chang MRN: [id number] Date of Birth: 08-05-1955  Transition of Care [facility]) CM/SW Contact:  Lorri Frederick, LCSW Phone Number: 03/26/2020, 1:31 PM   Clinical Narrative:   Pt discharging home.  Pt requested change to [employer] for home O2, however, [employer] already has pt as active client since 02/2020.  Informed pt of this, [employer] will evaluate for POC, which was pt main request.  [employer] to bring O2 tank for transport home.  Family to transport.      Final next level of care: Home/Self Care Barriers to Discharge: Barriers Resolved   Patient Goals and CMS Choice Patient states their goals for this hospitalization and ongoing recovery are:: "get back to normal life" CMS Medicare.gov Compare Post Acute Care list provided to::  (na)    Discharge Placement                       Discharge Plan and Services     Post Acute Care Choice: Durable Medical Equipment          DME Arranged: Oxygen DME Agency: [employer] Date DME Agency Contacted: 03/26/20 Time DME Agency Contacted: 1100 Representative spoke with at DME Agency: [facility] HH Arranged: NA HH Agency: NA        Social Determinants of Health (SDOH) Interventions     Readmission Risk Interventions Readmission Risk Prevention Plan 03/23/2020  Transportation Screening (No Data)  PCP or Specialist Appt within 3-5 Days Not Complete  Not Complete comments Pt sees Dr Carola Frost at [facility].  The office will reach out to pt to schedule this appointment.  HRI or Home Care Consult Complete  Social Work Consult for Recovery Care Planning/Counseling Complete  Palliative Care Screening Not Applicable

## 2020-05-02 ENCOUNTER — Encounter (HOSPITAL_BASED_OUTPATIENT_CLINIC_OR_DEPARTMENT_OTHER): Payer: Self-pay | Admitting: Obstetrics and Gynecology

## 2020-05-02 ENCOUNTER — Other Ambulatory Visit: Payer: Self-pay

## 2020-05-02 ENCOUNTER — Other Ambulatory Visit (HOSPITAL_BASED_OUTPATIENT_CLINIC_OR_DEPARTMENT_OTHER): Payer: Self-pay

## 2020-05-02 ENCOUNTER — Emergency Department (HOSPITAL_BASED_OUTPATIENT_CLINIC_OR_DEPARTMENT_OTHER)
Admission: EM | Admit: 2020-05-02 | Discharge: 2020-05-02 | Disposition: A | Discharge status: Home / Self Care | Payer: No Typology Code available for payment source | Attending: Emergency Medicine | Admitting: Emergency Medicine

## 2020-05-02 ENCOUNTER — Emergency Department (HOSPITAL_BASED_OUTPATIENT_CLINIC_OR_DEPARTMENT_OTHER): Payer: No Typology Code available for payment source | Admitting: Radiology

## 2020-05-02 DIAGNOSIS — Z8616 Personal history of COVID-19: Secondary | ICD-10-CM | POA: Insufficient documentation

## 2020-05-02 DIAGNOSIS — J181 Lobar pneumonia, unspecified organism: Secondary | ICD-10-CM | POA: Diagnosis not present

## 2020-05-02 DIAGNOSIS — I5033 Acute on chronic diastolic (congestive) heart failure: Secondary | ICD-10-CM | POA: Diagnosis not present

## 2020-05-02 DIAGNOSIS — Z79899 Other long term (current) drug therapy: Secondary | ICD-10-CM | POA: Diagnosis not present

## 2020-05-02 DIAGNOSIS — E876 Hypokalemia: Secondary | ICD-10-CM | POA: Diagnosis not present

## 2020-05-02 DIAGNOSIS — Z7951 Long term (current) use of inhaled steroids: Secondary | ICD-10-CM | POA: Diagnosis not present

## 2020-05-02 DIAGNOSIS — I110 Hypertensive heart disease with heart failure: Secondary | ICD-10-CM | POA: Diagnosis not present

## 2020-05-02 DIAGNOSIS — Z87891 Personal history of nicotine dependence: Secondary | ICD-10-CM | POA: Diagnosis not present

## 2020-05-02 DIAGNOSIS — R000 Tachycardia, unspecified: Secondary | ICD-10-CM | POA: Insufficient documentation

## 2020-05-02 DIAGNOSIS — R0600 Dyspnea, unspecified: Secondary | ICD-10-CM | POA: Diagnosis present

## 2020-05-02 DIAGNOSIS — J441 Chronic obstructive pulmonary disease with (acute) exacerbation: Secondary | ICD-10-CM | POA: Diagnosis not present

## 2020-05-02 DIAGNOSIS — J189 Pneumonia, unspecified organism: Secondary | ICD-10-CM

## 2020-05-02 HISTORY — DX: Heart failure, unspecified: I50.9

## 2020-05-02 HISTORY — DX: Post covid-19 condition, unspecified: U09.9

## 2020-05-02 LAB — CBC WITH DIFFERENTIAL/PLATELET
Abs Immature Granulocytes: 0.05 10*3/uL (ref 0.00–0.07)
Basophils Absolute: 0 10*3/uL (ref 0.0–0.1)
Basophils Relative: 0 %
Eosinophils Absolute: 0.3 10*3/uL (ref 0.0–0.5)
Eosinophils Relative: 4 %
HCT: 38.7 % — ABNORMAL LOW (ref 39.0–52.0)
Hemoglobin: 13.6 g/dL (ref 13.0–17.0)
Immature Granulocytes: 1 %
Lymphocytes Relative: 18 %
Lymphs Abs: 1.5 10*3/uL (ref 0.7–4.0)
MCH: 30.2 pg (ref 26.0–34.0)
MCHC: 35.1 g/dL (ref 30.0–36.0)
MCV: 85.8 fL (ref 80.0–100.0)
Monocytes Absolute: 0.8 10*3/uL (ref 0.1–1.0)
Monocytes Relative: 10 %
Neutro Abs: 5.4 10*3/uL (ref 1.7–7.7)
Neutrophils Relative %: 67 %
Platelets: 221 10*3/uL (ref 150–400)
RBC: 4.51 MIL/uL (ref 4.22–5.81)
RDW: 13.6 % (ref 11.5–15.5)
WBC: 8 10*3/uL (ref 4.0–10.5)
nRBC: 0 % (ref 0.0–0.2)

## 2020-05-02 LAB — COMPREHENSIVE METABOLIC PANEL
ALT: 24 U/L (ref 0–44)
AST: 21 U/L (ref 15–41)
Albumin: 4.3 g/dL (ref 3.5–5.0)
Alkaline Phosphatase: 58 U/L (ref 38–126)
Anion gap: 12 (ref 5–15)
BUN: 30 mg/dL — ABNORMAL HIGH (ref 8–23)
CO2: 29 mmol/L (ref 22–32)
Calcium: 9.7 mg/dL (ref 8.9–10.3)
Chloride: 95 mmol/L — ABNORMAL LOW (ref 98–111)
Creatinine, Ser: 1.31 mg/dL — ABNORMAL HIGH (ref 0.61–1.24)
GFR, Estimated: >60 mL/min (ref >60)
Glucose, Bld: 117 mg/dL — ABNORMAL HIGH (ref 70–99)
Potassium: 3 mmol/L — ABNORMAL LOW (ref 3.5–5.1)
Sodium: 136 mmol/L (ref 135–145)
Total Bilirubin: 0.5 mg/dL (ref 0.3–1.2)
Total Protein: 6.6 g/dL (ref 6.5–8.1)

## 2020-05-02 LAB — D-DIMER, QUANTITATIVE: D-Dimer, Quant: <0.27 ug/mL-FEU (ref 0.00–0.50)

## 2020-05-02 LAB — TROPONIN I (HIGH SENSITIVITY): Troponin I (High Sensitivity): 5 ng/L (ref <18)

## 2020-05-02 LAB — BRAIN NATRIURETIC PEPTIDE: B Natriuretic Peptide: 212.2 pg/mL — ABNORMAL HIGH (ref 0.0–100.0)

## 2020-05-02 MED ORDER — SODIUM CHLORIDE 0.9 % IV BOLUS
500.0000 mL | Freq: Once | INTRAVENOUS | Status: AC
  Administered 2020-05-02: 500 mL via INTRAVENOUS

## 2020-05-02 MED ORDER — POTASSIUM CHLORIDE CRYS ER 20 MEQ PO TBCR: 20.0000 meq | EXTENDED_RELEASE_TABLET | Freq: Every day | ORAL | 0 refills | Status: AC

## 2020-05-02 MED ORDER — POTASSIUM CHLORIDE CRYS ER 20 MEQ PO TBCR
40.0000 meq | EXTENDED_RELEASE_TABLET | Freq: Once | ORAL | Status: AC
  Administered 2020-05-02: 40 meq via ORAL
  Filled 2020-05-02: qty 2

## 2020-05-02 MED ORDER — ALBUTEROL SULFATE HFA 108 (90 BASE) MCG/ACT IN AERS
4.0000 | INHALATION_SPRAY | Freq: Once | RESPIRATORY_TRACT | Status: AC
  Administered 2020-05-02: 4 via RESPIRATORY_TRACT
  Filled 2020-05-02: qty 6.7

## 2020-05-02 MED ORDER — DOXYCYCLINE HYCLATE 100 MG PO CAPS: 100.0000 mg | ORAL_CAPSULE | Freq: Two times a day (BID) | ORAL | 0 refills | Status: DC

## 2020-05-02 MED ORDER — PREDNISONE 20 MG PO TABS: 40.0000 mg | ORAL_TABLET | Freq: Every day | ORAL | 0 refills | Status: DC

## 2020-05-02 MED ORDER — DOXYCYCLINE HYCLATE 100 MG PO CAPS
100.0000 mg | ORAL_CAPSULE | Freq: Two times a day (BID) | ORAL | 0 refills | Status: DC
  Filled 2020-05-02: qty 14, 7d supply, fill #0

## 2020-05-02 MED ORDER — POTASSIUM CHLORIDE CRYS ER 20 MEQ PO TBCR
20.0000 meq | EXTENDED_RELEASE_TABLET | Freq: Every day | ORAL | 0 refills | Status: DC
  Filled 2020-05-02: qty 3, 3d supply, fill #0

## 2020-05-02 NOTE — ED Notes (Signed)
Pt. ambulated in hall on room air with portable pulse ox, tolerated well with minimal oxygen desaturations, HR/RR both remained WNL, no other complications, placed back on monitor, MD/RN made aware.

## 2020-05-02 NOTE — ED Triage Notes (Signed)
Patient reports he was diagnosed with long term COVID and that he also has CHF. Patient reports increasing SOB. Patient states he was told yesterday about the CHF

## 2020-05-02 NOTE — ED Provider Notes (Signed)
[facility] EMERGENCY DEPT Provider Note   CSN: [id number] Arrival date & time: 05/02/20  1002     History Chief Complaint  Patient presents with  . Shortness of Breath    Tanner Chang is a 65 y.o. male.  HPI 65 year old male presents with dyspnea. Has had trouble with dyspnea on exertion this morning. This seems worse than last admission. He denies any significant cough, no fever, chest pain. His legs are not swollen. He has been compliant with lasix. No congestion like last time. He was on oxygen but his pulmonologist told him he doesn't need it. Did not try his rescue inhaler this morning.   Past Medical History:  Diagnosis Date  . CHF (congestive heart failure) (HCC)   . COPD (chronic obstructive pulmonary disease) (HCC)   . Hypertension   . Long COVID     Patient Active Problem List   Diagnosis Date Noted  . Acute on chronic diastolic CHF (congestive heart failure) (HCC) 03/23/2020  . Acute on chronic respiratory failure with hypoxia (HCC) 03/21/2020  . Anxiety and depression 03/21/2020  . COPD with acute exacerbation (HCC) 02/16/2020  . Essential hypertension 02/16/2020  . Hypokalemia 02/16/2020  . COPD exacerbation (HCC) 02/16/2020  . Coronary artery calcification seen on CT scan 12/14/2019    Past Surgical History:  Procedure Laterality Date  . BACK SURGERY  1990       No family history on file.  Social History   Tobacco Use  . Smoking status: Former Smoker    Years: 20.00    Types: Cigarettes  . Smokeless tobacco: Former Engineer, water Use Topics  . Alcohol use: Yes  . Drug use: Not Currently    Home Medications Prior to Admission medications   Medication Sig Start Date End Date Taking? Authorizing Provider  acetaminophen (TYLENOL) 500 MG tablet Take 1,000 mg by mouth every 6 (six) hours as needed for mild pain or headache.   Yes [provider]  albuterol (VENTOLIN HFA) 108 (90 Base) MCG/ACT inhaler Inhale 2 puffs  into the lungs every 4 (four) hours as needed for wheezing or shortness of breath. 12/14/19  Yes Dykstra, Quitman Livings, MD  Aspirin-Salicylamide-Caffeine [facility] HEADACHE POWDER PO) Take 2 packets by mouth daily as needed (headaches).   Yes [provider]  atorvastatin (LIPITOR) 10 MG tablet Take 10 mg by mouth at bedtime.   Yes [provider]  buPROPion (WELLBUTRIN XL) 300 MG 24 hr tablet Take 300 mg by mouth daily with breakfast.   Yes [provider]  FLUoxetine (PROZAC) 20 MG capsule Take 20 mg by mouth in the morning.   Yes [provider]  furosemide (LASIX) 40 MG tablet Take 1 tablet (40 mg total) by mouth daily. 03/27/20  Yes Noralee Stain, DO  Ipratropium-Albuterol (COMBIVENT RESPIMAT) 20-100 MCG/ACT AERS respimat Inhale 2 puffs into the lungs daily.   Yes [provider]  losartan (COZAAR) 100 MG tablet Take 100 mg by mouth in the morning.   Yes [provider]  Multiple Vitamins-Minerals (ONE-A-DAY MENS 50+ ADVANTAGE) TABS Take 1 tablet by mouth daily with breakfast.   Yes [provider]  omeprazole (PRILOSEC) 20 MG capsule Take 20 mg by mouth daily before breakfast.   Yes [provider]  OVER THE COUNTER MEDICATION Take 1 tablet by mouth daily. Vitamin d, vitamin c, zinc combo vitamin   Yes [provider]  Oxcarbazepine (TRILEPTAL) 300 MG tablet Take 300 mg by mouth 2 (two) times  daily.   Yes [provider]  Psyllium (METAMUCIL PO) Take by mouth See admin instructions. Mix 1 tablespoonful of powder into 8 ounces of water and drink once a day as needed for constipation   Yes [provider]  budesonide-formoterol (SYMBICORT) 160-4.5 MCG/ACT inhaler Inhale 2 puffs into the lungs 2 (two) times daily.    [provider]  doxycycline (VIBRAMYCIN) 100 MG capsule Take 1 capsule (100 mg total) by mouth 2 (two) times daily. 05/02/20   Pricilla Loveless, MD  potassium chloride SA (KLOR-CON) 20 MEQ  tablet Take 1 tablet (20 mEq total) by mouth daily. 05/02/20   Pricilla Loveless, MD  predniSONE (DELTASONE) 20 MG tablet Take 2 tablets (40 mg total) by mouth daily. 05/02/20   Pricilla Loveless, MD    Allergies    Penicillins and Sulfamethoxazole-trimethoprim  Review of Systems   Review of Systems  Constitutional: Negative for fever.  Respiratory: Positive for cough (chronic, unchanged) and shortness of breath. Negative for wheezing.   Cardiovascular: Negative for chest pain and leg swelling.  All other systems reviewed and are negative.   Physical Exam Updated Vital Signs BP 107/83   Pulse 94   Temp 98 F (36.7 C) (Oral)   Resp 18   Ht 5\' 6"  (1.676 m)   Wt 83.9 kg   SpO2 94%   BMI 29.86 kg/m   Physical Exam Vitals and nursing note reviewed.  Constitutional:      Appearance: He is well-developed.  HENT:     Head: Normocephalic and atraumatic.     Right Ear: External ear normal.     Left Ear: External ear normal.     Nose: Nose normal.  Eyes:     General:        Right eye: No discharge.        Left eye: No discharge.  Cardiovascular:     Rate and Rhythm: Regular rhythm. Tachycardia present.     Heart sounds: Normal heart sounds.     Comments: HR 90s, occasionally 100 Pulmonary:     Effort: Pulmonary effort is normal. No tachypnea, accessory muscle usage or respiratory distress.     Breath sounds: Wheezing (slight, intermittent) present.  Abdominal:     Palpations: Abdomen is soft.     Tenderness: There is no abdominal tenderness.  Musculoskeletal:     Cervical back: Neck supple.     Right lower leg: No edema.     Left lower leg: No edema.  Skin:    General: Skin is warm and dry.  Neurological:     Mental Status: He is alert.  Psychiatric:        Mood and Affect: Mood is not anxious.     ED Results / Procedures / Treatments   Labs (all labs ordered are listed, but only abnormal results are displayed) Labs Reviewed  COMPREHENSIVE METABOLIC PANEL -  Abnormal; Notable for the following components:      Result Value   Potassium 3.0 (*)    Chloride 95 (*)    Glucose, Bld 117 (*)    BUN 30 (*)    Creatinine, Ser 1.31 (*)    All other components within normal limits  BRAIN NATRIURETIC PEPTIDE - Abnormal; Notable for the following components:   B Natriuretic Peptide 212.2 (*)    All other components within normal limits  CBC WITH DIFFERENTIAL/PLATELET - Abnormal; Notable for the following components:   HCT 38.7 (*)    All other components within normal  limits  D-DIMER, QUANTITATIVE  TROPONIN I (HIGH SENSITIVITY)    EKG EKG Interpretation  Date/Time:  Wednesday May 02 2020 10:10:52 EDT Ventricular Rate:  97 PR Interval:  149 QRS Duration: 96 QT Interval:  400 QTC Calculation: 509 R Axis:   25 Text Interpretation: Sinus tachycardia Multiple ventricular premature complexes Low voltage, precordial leads Borderline T abnormalities, inferior leads Prolonged QT interval similar to Mar 2022 Confirmed by Pricilla Loveless [phone]) on 05/02/2020 10:18:37 AM   Radiology DG Chest 2 View  Result Date: 05/02/2020 CLINICAL DATA:  Shortness of breath. EXAM: CHEST - 2 VIEW COMPARISON:  03/20/2020 FINDINGS: Elevated left diaphragm with overlying scar, stable. Small opacity over the right mid lung with vague nodular features. No edema or pneumothorax. Remote rib fractures. Stable heart size and normal aortic contours. IMPRESSION: Subtle right perihilar opacity, question new onset pneumonia symptoms. Followup PA and lateral chest X-ray is recommended in 3-4 weeks to ensure resolution. Scarring at the left lung base. Electronically Signed   By: Marnee Spring M.D.   On: 05/02/2020 10:47    Procedures Procedures   Medications Ordered in ED Medications  albuterol (VENTOLIN HFA) 108 (90 Base) MCG/ACT inhaler 4 puff (4 puffs Inhalation Given 05/02/20 1045)  potassium chloride SA (KLOR-CON) CR tablet 40 mEq (40 mEq Oral Given 05/02/20 1134)  sodium  chloride 0.9 % bolus 500 mL (0 mLs Intravenous Stopped 05/02/20 1252)    ED Course  I have reviewed the triage vital signs and the nursing notes.  Pertinent labs & imaging results that were available during my care of the patient were reviewed by me and considered in my medical decision making (see chart for details).    MDM Rules/Calculators/A&P                          Patient is feeling much better after albuterol inhaler administration.  He was able to walk around with no significant shortness of breath and feels well enough for discharge.  No hypoxia.  He is noted to potentially have some mild pneumonia on chest x-ray.  Given this we will give some antibiotics.  Given what seems like milder COPD exacerbation we will also give prednisone.  His troponin is normal and his D-dimer is normal and so given I have otherwise low suspicion for ACS or PE I think no further work-up is indicated.  His BNP is moderate, given that he does not show peripheral edema now and actually feels like he is dehydrated I think this is not representative of CHF.  He was given a small fluid bolus given mild BUN elevation and mild creatinine elevation.  We will replete his potassium which is probably from Lasix.  Otherwise, follow-up with PCP. Final Clinical Impression(s) / ED Diagnoses Final diagnoses:  COPD exacerbation (HCC)  Community acquired pneumonia of right lower lobe of lung  Hypokalemia    Rx / DC Orders ED Discharge Orders         Ordered    predniSONE (DELTASONE) 20 MG tablet  Daily,   Status:  Discontinued        05/02/20 1245    doxycycline (VIBRAMYCIN) 100 MG capsule  2 times daily,   Status:  Discontinued        05/02/20 1245    potassium chloride SA (KLOR-CON) 20 MEQ tablet  Daily,   Status:  Discontinued        05/02/20 1246    doxycycline (VIBRAMYCIN) 100 MG capsule  2 times daily        05/02/20 1257    potassium chloride SA (KLOR-CON) 20 MEQ tablet  Daily        05/02/20 1257     predniSONE (DELTASONE) 20 MG tablet  Daily,   Status:  Discontinued        05/02/20 1257    predniSONE (DELTASONE) 20 MG tablet  Daily        05/02/20 1258           Pricilla LovelessGoldston, Niraj Kudrna, MD 05/02/20 1259

## 2020-05-02 NOTE — Discharge Instructions (Signed)
If you develop high fever, severe cough or cough with blood, trouble breathing, severe headache, neck pain/stiffness, vomiting, or any other new/concerning symptoms then return to the ER for evaluation   Your chest x-ray shows you likely have pneumonia.  However you will need a repeat chest x-ray by your primary care physician after you have recovered from this acute illness to ensure that it resolves.  You may use your albuterol inhaler 2 puffs every 4 hours for cough or shortness of breath.

## 2020-05-09 ENCOUNTER — Telehealth (HOSPITAL_COMMUNITY): Payer: Self-pay

## 2020-05-09 NOTE — Telephone Encounter (Signed)
Called patient to see if he is interested in the Pulmonary Rehab Program. Patient expressed interest. Explained scheduling process and went over insurance, patient verbalized understanding. 

## 2020-05-09 NOTE — Telephone Encounter (Signed)
Pt is covered thru the [place]. Auth# [id number].

## 2020-05-10 ENCOUNTER — Encounter (HOSPITAL_COMMUNITY): Payer: Self-pay | Admitting: *Deleted

## 2020-05-10 NOTE — Progress Notes (Signed)
Received [facility] authorization [id number]  from Dr. Shelle Iron for this pt to participate in pulmonary rehab with the the diagnosis of COPD. Clinical review of pt follow up appt on 4/18 Pulmonary office note.  Pt with Covid Risk Score - 5. Tanner Chang, BSN Cardiac and Emergency planning/management officer

## 2020-05-24 ENCOUNTER — Encounter (HOSPITAL_BASED_OUTPATIENT_CLINIC_OR_DEPARTMENT_OTHER): Payer: Self-pay | Admitting: *Deleted

## 2020-05-24 ENCOUNTER — Other Ambulatory Visit: Payer: Self-pay

## 2020-05-24 ENCOUNTER — Emergency Department (HOSPITAL_BASED_OUTPATIENT_CLINIC_OR_DEPARTMENT_OTHER): Payer: No Typology Code available for payment source

## 2020-05-24 ENCOUNTER — Emergency Department (HOSPITAL_BASED_OUTPATIENT_CLINIC_OR_DEPARTMENT_OTHER): Payer: No Typology Code available for payment source | Admitting: Radiology

## 2020-05-24 ENCOUNTER — Emergency Department (HOSPITAL_BASED_OUTPATIENT_CLINIC_OR_DEPARTMENT_OTHER)
Admission: EM | Admit: 2020-05-24 | Discharge: 2020-05-25 | Disposition: A | Discharge status: Home / Self Care | Payer: No Typology Code available for payment source | Attending: Emergency Medicine | Admitting: Emergency Medicine

## 2020-05-24 DIAGNOSIS — Z8616 Personal history of COVID-19: Secondary | ICD-10-CM | POA: Insufficient documentation

## 2020-05-24 DIAGNOSIS — J441 Chronic obstructive pulmonary disease with (acute) exacerbation: Secondary | ICD-10-CM | POA: Diagnosis not present

## 2020-05-24 DIAGNOSIS — I110 Hypertensive heart disease with heart failure: Secondary | ICD-10-CM | POA: Insufficient documentation

## 2020-05-24 DIAGNOSIS — Z79899 Other long term (current) drug therapy: Secondary | ICD-10-CM | POA: Diagnosis not present

## 2020-05-24 DIAGNOSIS — I5033 Acute on chronic diastolic (congestive) heart failure: Secondary | ICD-10-CM | POA: Insufficient documentation

## 2020-05-24 DIAGNOSIS — Z8781 Personal history of (healed) traumatic fracture: Secondary | ICD-10-CM | POA: Insufficient documentation

## 2020-05-24 DIAGNOSIS — Z7951 Long term (current) use of inhaled steroids: Secondary | ICD-10-CM | POA: Insufficient documentation

## 2020-05-24 DIAGNOSIS — R0602 Shortness of breath: Secondary | ICD-10-CM | POA: Diagnosis present

## 2020-05-24 DIAGNOSIS — Z7982 Long term (current) use of aspirin: Secondary | ICD-10-CM | POA: Insufficient documentation

## 2020-05-24 DIAGNOSIS — Z87891 Personal history of nicotine dependence: Secondary | ICD-10-CM | POA: Insufficient documentation

## 2020-05-24 DIAGNOSIS — R062 Wheezing: Secondary | ICD-10-CM

## 2020-05-24 LAB — CBC WITH DIFFERENTIAL/PLATELET
Abs Immature Granulocytes: 0.03 10*3/uL (ref 0.00–0.07)
Basophils Absolute: 0.1 10*3/uL (ref 0.0–0.1)
Basophils Relative: 1 %
Eosinophils Absolute: 1 10*3/uL — ABNORMAL HIGH (ref 0.0–0.5)
Eosinophils Relative: 13 %
HCT: 40 % (ref 39.0–52.0)
Hemoglobin: 13.3 g/dL (ref 13.0–17.0)
Immature Granulocytes: 0 %
Lymphocytes Relative: 20 %
Lymphs Abs: 1.6 10*3/uL (ref 0.7–4.0)
MCH: 30 pg (ref 26.0–34.0)
MCHC: 33.3 g/dL (ref 30.0–36.0)
MCV: 90.1 fL (ref 80.0–100.0)
Monocytes Absolute: 0.9 10*3/uL (ref 0.1–1.0)
Monocytes Relative: 12 %
Neutro Abs: 4.2 10*3/uL (ref 1.7–7.7)
Neutrophils Relative %: 54 %
Platelets: 209 10*3/uL (ref 150–400)
RBC: 4.44 MIL/uL (ref 4.22–5.81)
RDW: 13.7 % (ref 11.5–15.5)
WBC: 7.7 10*3/uL (ref 4.0–10.5)
nRBC: 0 % (ref 0.0–0.2)

## 2020-05-24 LAB — COMPREHENSIVE METABOLIC PANEL
ALT: 19 U/L (ref 0–44)
AST: 17 U/L (ref 15–41)
Albumin: 4.6 g/dL (ref 3.5–5.0)
Alkaline Phosphatase: 51 U/L (ref 38–126)
Anion gap: 11 (ref 5–15)
BUN: 28 mg/dL — ABNORMAL HIGH (ref 8–23)
CO2: 32 mmol/L (ref 22–32)
Calcium: 10.1 mg/dL (ref 8.9–10.3)
Chloride: 98 mmol/L (ref 98–111)
Creatinine, Ser: 1.27 mg/dL — ABNORMAL HIGH (ref 0.61–1.24)
GFR, Estimated: >60 mL/min (ref >60)
Glucose, Bld: 117 mg/dL — ABNORMAL HIGH (ref 70–99)
Potassium: 3.5 mmol/L (ref 3.5–5.1)
Sodium: 141 mmol/L (ref 135–145)
Total Bilirubin: 0.4 mg/dL (ref 0.3–1.2)
Total Protein: 6.9 g/dL (ref 6.5–8.1)

## 2020-05-24 LAB — TROPONIN I (HIGH SENSITIVITY): Troponin I (High Sensitivity): 4 ng/L (ref <18)

## 2020-05-24 LAB — LACTIC ACID, PLASMA
Lactic Acid, Venous: 3.1 mmol/L (ref 0.5–1.9)
Lactic Acid, Venous: 1 mmol/L (ref 0.5–1.9)

## 2020-05-24 MED ORDER — METHYLPREDNISOLONE SODIUM SUCC 125 MG IJ SOLR
125.0000 mg | Freq: Once | INTRAMUSCULAR | Status: AC
  Administered 2020-05-24: 125 mg via INTRAVENOUS
  Filled 2020-05-24: qty 2

## 2020-05-24 MED ORDER — AEROCHAMBER PLUS W/MASK MISC
1.0000 | Freq: Once | Status: AC
  Administered 2020-05-24: 1
  Filled 2020-05-24: qty 1

## 2020-05-24 MED ORDER — IPRATROPIUM BROMIDE HFA 17 MCG/ACT IN AERS
2.0000 | INHALATION_SPRAY | RESPIRATORY_TRACT | Status: DC
  Administered 2020-05-24 (×2): 2 via RESPIRATORY_TRACT
  Filled 2020-05-24: qty 12.9

## 2020-05-24 MED ORDER — IPRATROPIUM-ALBUTEROL 0.5-2.5 (3) MG/3ML IN SOLN
3.0000 mL | Freq: Once | RESPIRATORY_TRACT | Status: AC
  Administered 2020-05-24: 3 mL via RESPIRATORY_TRACT
  Filled 2020-05-24: qty 3

## 2020-05-24 NOTE — ED Provider Notes (Signed)
[facility] EMERGENCY DEPT Provider Note   CSN: [id number] Arrival date & time: 05/24/20  1733     History Chief Complaint  Patient presents with  . Cough    Tanner Chang is a 65 y.o. male.  The history is provided by the patient and medical records. No language interpreter was used.  Shortness of Breath Severity:  Moderate Onset quality:  Gradual Duration:  4 days Timing:  Constant Progression:  Waxing and waning Chronicity:  Recurrent Context: pollens   Relieved by:  Nothing Worsened by:  Coughing Ineffective treatments:  None tried Associated symptoms: cough and wheezing   Associated symptoms: no abdominal pain, no chest pain, no diaphoresis, no fever, no headaches, no sputum production and no vomiting        Past Medical History:  Diagnosis Date  . CHF (congestive heart failure) (HCC)   . COPD (chronic obstructive pulmonary disease) (HCC)   . Hypertension   . Long COVID     Patient Active Problem List   Diagnosis Date Noted  . Acute on chronic diastolic CHF (congestive heart failure) (HCC) 03/23/2020  . Acute on chronic respiratory failure with hypoxia (HCC) 03/21/2020  . Anxiety and depression 03/21/2020  . COPD with acute exacerbation (HCC) 02/16/2020  . Essential hypertension 02/16/2020  . Hypokalemia 02/16/2020  . COPD exacerbation (HCC) 02/16/2020  . Coronary artery calcification seen on CT scan 12/14/2019    Past Surgical History:  Procedure Laterality Date  . BACK SURGERY  1990       History reviewed. No pertinent family history.  Social History   Tobacco Use  . Smoking status: Former Smoker    Years: 20.00    Types: Cigarettes  . Smokeless tobacco: Former Engineer, water Use Topics  . Alcohol use: Yes  . Drug use: Not Currently    Home Medications Prior to Admission medications   Medication Sig Start Date End Date Taking? Authorizing Provider  albuterol (VENTOLIN HFA) 108 (90 Base) MCG/ACT inhaler Inhale 2 puffs  into the lungs every 4 (four) hours as needed for wheezing or shortness of breath. 12/14/19  Yes Milagros Loll, MD  atorvastatin (LIPITOR) 10 MG tablet Take 10 mg by mouth at bedtime.   Yes [provider]  budesonide-formoterol (SYMBICORT) 160-4.5 MCG/ACT inhaler Inhale 2 puffs into the lungs 2 (two) times daily.   Yes [provider]  buPROPion (WELLBUTRIN XL) 300 MG 24 hr tablet Take 300 mg by mouth daily with breakfast.   Yes [provider]  furosemide (LASIX) 40 MG tablet Take 1 tablet (40 mg total) by mouth daily. 03/27/20  Yes Noralee Stain, DO  Ipratropium-Albuterol (COMBIVENT RESPIMAT) 20-100 MCG/ACT AERS respimat Inhale 2 puffs into the lungs daily.   Yes [provider]  losartan (COZAAR) 100 MG tablet Take 100 mg by mouth in the morning.   Yes [provider]  Multiple Vitamins-Minerals (ONE-A-DAY MENS 50+ ADVANTAGE) TABS Take 1 tablet by mouth daily with breakfast.   Yes [provider]  omeprazole (PRILOSEC) 20 MG capsule Take 20 mg by mouth daily before breakfast.   Yes [provider]  Oxcarbazepine (TRILEPTAL) 300 MG tablet Take 300 mg by mouth 2 (two) times daily.   Yes [provider]  potassium chloride SA (KLOR-CON) 20 MEQ tablet Take 1 tablet (20 mEq total) by mouth daily. 05/02/20  Yes Pricilla Loveless, MD  Psyllium (METAMUCIL PO) Take by mouth See admin instructions. Mix 1 tablespoonful of powder into 8 ounces of water and  drink once a day as needed for constipation   Yes [provider]  acetaminophen (TYLENOL) 500 MG tablet Take 1,000 mg by mouth every 6 (six) hours as needed for mild pain or headache.    [provider]  Aspirin-Salicylamide-Caffeine (BC HEADACHE POWDER PO) Take 2 packets by mouth daily as needed (headaches).    [provider]  FLUoxetine (PROZAC) 20 MG capsule Take 20 mg by mouth in the morning.    [provider]  OVER THE COUNTER MEDICATION Take  1 tablet by mouth daily. Vitamin d, vitamin c, zinc combo vitamin    [provider]    Allergies    Penicillins and Sulfamethoxazole-trimethoprim  Review of Systems   Review of Systems  Constitutional: Negative for appetite change, chills, diaphoresis, fatigue and fever.  HENT: Positive for congestion.   Eyes: Negative for visual disturbance.  Respiratory: Positive for cough, chest tightness, shortness of breath and wheezing. Negative for sputum production and stridor.   Cardiovascular: Negative for chest pain, palpitations and leg swelling.  Gastrointestinal: Negative for abdominal pain, constipation, diarrhea, nausea and vomiting.  Genitourinary: Negative for flank pain.  Musculoskeletal: Negative for back pain.  Neurological: Negative for light-headedness and headaches.  Psychiatric/Behavioral: Negative for agitation.  All other systems reviewed and are negative.   Physical Exam Updated Vital Signs BP 108/78   Pulse 89   Temp 98.9 F (37.2 C) (Oral)   Resp (!) 22   Ht  (1.676 m)   Wt 83.9 kg   SpO2 99%   BMI 29.85 kg/m   Physical Exam Vitals and nursing note reviewed.  Constitutional:      General: He is not in acute distress.    Appearance: He is well-developed. He is not ill-appearing, toxic-appearing or diaphoretic.  HENT:     Head: Normocephalic and atraumatic.     Mouth/Throat:     Mouth: Mucous membranes are moist.  Eyes:     Conjunctiva/sclera: Conjunctivae normal.  Cardiovascular:     Rate and Rhythm: Normal rate and regular rhythm.     Pulses: Normal pulses.     Heart sounds: No murmur heard.   Pulmonary:     Effort: Pulmonary effort is normal. No respiratory distress.     Breath sounds: Wheezing present. No rhonchi or rales.  Chest:     Chest wall: No tenderness.  Abdominal:     Palpations: Abdomen is soft.     Tenderness: There is no abdominal tenderness.  Musculoskeletal:        General: No tenderness.     Cervical back: Neck  supple. No tenderness.  Skin:    General: Skin is warm and dry.     Capillary Refill: Capillary refill takes less than 2 seconds.  Neurological:     Mental Status: He is alert.  Psychiatric:        Mood and Affect: Mood normal.     ED Results / Procedures / Treatments   Labs (all labs ordered are listed, but only abnormal results are displayed) Labs Reviewed  CBC WITH DIFFERENTIAL/PLATELET - Abnormal; Notable for the following components:      Result Value   Eosinophils Absolute 1.0 (*)    All other components within normal limits  COMPREHENSIVE METABOLIC PANEL - Abnormal; Notable for the following components:   Glucose, Bld 117 (*)    BUN 28 (*)    Creatinine, Ser 1.27 (*)    All other components within normal limits  LACTIC ACID, PLASMA -  Abnormal; Notable for the following components:   Lactic Acid, Venous 3.1 (*)    All other components within normal limits  LACTIC ACID, PLASMA  TROPONIN I (HIGH SENSITIVITY)    EKG None  Radiology DG Chest 2 View  Result Date: 05/24/2020 CLINICAL DATA:  Cough and chest congestion. Shortness of breath. Ex-smoker. EXAM: CHEST - 2 VIEW COMPARISON:  05/02/2020.  Chest CT dated 12/14/2019. FINDINGS: Normal sized heart. Stable elevated left hemidiaphragm. Stable they get nodular densities in the lateral aspect of the right mid lung zone. Unremarkable bones. IMPRESSION: Persistent vague nodular densities overlying the lateral aspect of the right mid lung zone. Further evaluation with chest CT is recommended. Electronically Signed   By: Beckie Salts M.D.   On: 05/24/2020 18:21   CT Chest Wo Contrast  Result Date: 05/24/2020 CLINICAL DATA:  Nondiagnostic chest x-ray with cough and shortness of breath EXAM: CT CHEST WITHOUT CONTRAST TECHNIQUE: Multidetector CT imaging of the chest was performed following the standard protocol without IV contrast. COMPARISON:  Chest x-ray 05/24/2020, CT chest 12/14/2019 FINDINGS: Cardiovascular: Limited evaluation  without intravenous contrast. Mild aortic atherosclerosis. No aneurysm. Coronary vascular calcification. Normal cardiac size. No pericardial effusion Mediastinum/Nodes: Midline trachea. No thyroid mass. No suspicious adenopathy. Esophagus unremarkable Lungs/Pleura: Mild emphysema. No acute consolidation, pleural effusion or pneumothorax. Chronic elevation of left diaphragm with partial atelectasis in the left lower lobe Upper Abdomen: No acute abnormality Musculoskeletal: Subacute to chronic appearing right fourth and fifth anterolateral rib fractures corresponding to radiographic abnormality. Chronic right lateral sixth through ninth rib fractures. IMPRESSION: 1. Nodular right lung opacities correspond to subacute to chronic right fourth and fifth anterolateral rib fractures. 2. There is no acute airspace disease. 3. Mild emphysema 4. Chronic elevation left diaphragm with partial atelectasis at the left base Aortic Atherosclerosis (ICD10-I70.0) and Emphysema (ICD10-J43.9). Electronically Signed   By: Jasmine Pang M.D.   On: 05/24/2020 20:53    Procedures Procedures   Medications Ordered in ED Medications  ipratropium (ATROVENT HFA) inhaler 2 puff (2 puffs Inhalation Given by Other 05/24/20 2327)  ipratropium-albuterol (DUONEB) 0.5-2.5 (3) MG/3ML nebulizer solution 3 mL (3 mLs Nebulization Given 05/24/20 1952)  methylPREDNISolone sodium succinate (SOLU-MEDROL) 125 mg/2 mL injection 125 mg (125 mg Intravenous Given 05/24/20 2059)  aerochamber plus with mask device 1 each (1 each Other Given 05/24/20 2141)    ED Course  I have reviewed the triage vital signs and the nursing notes.  Pertinent labs & imaging results that were available during my care of the patient were reviewed by me and considered in my medical decision making (see chart for details).  Clinical Course as of 05/25/20 0058  Thu May 24, 2020  2301 Lactic Acid, Venous(!!): 3.1 [CH]    Clinical Course User Index [CH] Lazaro Arms    MDM Rules/Calculators/A&P                          Lennix Kneisel is a 65 y.o. male with a past medical history significant for hypertension, COPD, CAD, CHF, anxiety, and depression who presents with shortness of breath and coughing.  He reports that for the last few days he has had recurrent episodes of chest tightness congestion and some coughing.  He denies any production of his cough.  He denies any fevers or chills.  He is concerned he may have pneumonia again.  He denies any nausea, vomiting, constipation, diarrhea, or other complaints.  Denies any chest pain at this  time.  He reports it feels similar to when he had pneumonia several months ago and has been struggling with reported long COVID.  He says that he is scheduled to follow-up with pulmonology in the near future and his only use of albuterol at home.  On exam, lungs have wheezing in lung fields but there is no rales or rhonchi.  Chest and abdomen are nontender.  No lower extremity tenderness or edema appreciated.  Patient well-appearing otherwise.  Patient was given albuterol and Atrovent with improvement in his breathing.  Clinically I suspect patient is having a COPD exacerbation in the setting of seasonal allergy changes.  Patient agrees.  We will give steroids and reassess patient.  We will get imaging including chest x-ray.  Chest x-ray shows some opacities recommending CT.  CT was performed showing old rib fractures which patient described from several years ago but no new opacity or pneumonia.  Patient will be given prescription for steroids for the next few days and will refill his home nebulizer treatment.  He will follow up with pulmonology as we discussed and otherwise work-up was overall reassuring.  Given his well appearance and ambulating without hypoxia, we feel he is safe for discharge home.  Patient agrees with plan of care and was discharged in good condition with improved breathing.   Final Clinical  Impression(s) / ED Diagnoses Final diagnoses:  COPD exacerbation (HCC)  Shortness of breath  Wheezing  History of rib fracture    Rx / DC Orders ED Discharge Orders         Ordered    predniSONE (DELTASONE) 50 MG tablet  Daily with breakfast        05/25/20 0018    albuterol (PROVENTIL) (2.5 MG/3ML) 0.083% nebulizer solution  Every 6 hours PRN        05/25/20 0018          Clinical Impression: 1. COPD exacerbation (HCC)   2. Shortness of breath   3. Wheezing   4. History of rib fracture     Disposition: Discharge  Condition: Good  I have discussed the results, Dx and Tx plan with the pt(& family if present). He/she/they expressed understanding and agree(s) with the plan. Discharge instructions discussed at great length. Strict return precautions discussed and pt &/or family have verbalized understanding of the instructions. No further questions at time of discharge.    Discharge Medication List as of 05/25/2020 12:20 AM    START taking these medications   Details  albuterol (PROVENTIL) (2.5 MG/3ML) 0.083% nebulizer solution Take 3 mLs (2.5 mg total) by nebulization every 6 (six) hours as needed for wheezing or shortness of breath., Starting Fri 05/25/2020, Print    predniSONE (DELTASONE) 50 MG tablet Take 1 tablet (50 mg total) by mouth daily with breakfast for 4 days., Starting Fri 05/25/2020, Until Tue 05/29/2020, Print        Follow Up: [facility] [address] [place] [address] [phone]     [facility] [address] [place] [address] [phone]       Haylen Bellotti, Canary Brim, MD 05/25/20 [phone]

## 2020-05-24 NOTE — ED Triage Notes (Signed)
Patient was here on 05/02/2020 with the c/o chest congestion and coughing and SOB.  Pt is here for the same complaint.

## 2020-05-24 NOTE — ED Notes (Signed)
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 Pt was able to teach back to RT proper technique and use of medication/aero chamber.

## 2020-05-24 NOTE — ED Notes (Signed)
RT attempted to place PIV in right AC, blood return but unable to flush. Place second PIV in left AC, flushed, secured and saline locked.

## 2020-05-25 ENCOUNTER — Other Ambulatory Visit (HOSPITAL_BASED_OUTPATIENT_CLINIC_OR_DEPARTMENT_OTHER): Payer: Self-pay

## 2020-05-25 MED ORDER — ALBUTEROL SULFATE (2.5 MG/3ML) 0.083% IN NEBU: 2.5000 mg | INHALATION_SOLUTION | Freq: Four times a day (QID) | RESPIRATORY_TRACT | 12 refills | Status: DC | PRN

## 2020-05-25 MED ORDER — PREDNISONE 50 MG PO TABS: 50.0000 mg | ORAL_TABLET | Freq: Every day | ORAL | 0 refills | Status: AC

## 2020-05-25 NOTE — Discharge Instructions (Signed)
Your history and exam today are consistent with a COPD exacerbation causing your shortness of breath and symptoms.  After steroids and inhalers, your breathing improved and you maintain oxygen saturations in the 90s.  The x-ray showed concern for opacity but the CT scan shows those opacities or previous rib fractures and no actual pneumonia seen.  Please take the steroids for the next 4 days and use the inhalers.  Please use the nebulizer solution at home.  Please follow-up with your pulmonary team as we discussed.  If any symptoms change or worsen acutely, please return to the nearest emergency department.

## 2020-06-29 ENCOUNTER — Emergency Department (HOSPITAL_BASED_OUTPATIENT_CLINIC_OR_DEPARTMENT_OTHER): Payer: No Typology Code available for payment source

## 2020-06-29 ENCOUNTER — Encounter (HOSPITAL_BASED_OUTPATIENT_CLINIC_OR_DEPARTMENT_OTHER): Payer: Self-pay | Admitting: Emergency Medicine

## 2020-06-29 ENCOUNTER — Emergency Department (HOSPITAL_BASED_OUTPATIENT_CLINIC_OR_DEPARTMENT_OTHER)
Admission: EM | Admit: 2020-06-29 | Discharge: 2020-06-29 | Disposition: A | Discharge status: Home / Self Care | Payer: No Typology Code available for payment source | Attending: Emergency Medicine | Admitting: Emergency Medicine

## 2020-06-29 ENCOUNTER — Other Ambulatory Visit: Payer: Self-pay

## 2020-06-29 DIAGNOSIS — Z7951 Long term (current) use of inhaled steroids: Secondary | ICD-10-CM | POA: Diagnosis not present

## 2020-06-29 DIAGNOSIS — R600 Localized edema: Secondary | ICD-10-CM | POA: Diagnosis not present

## 2020-06-29 DIAGNOSIS — Z79899 Other long term (current) drug therapy: Secondary | ICD-10-CM | POA: Insufficient documentation

## 2020-06-29 DIAGNOSIS — Z8616 Personal history of COVID-19: Secondary | ICD-10-CM | POA: Insufficient documentation

## 2020-06-29 DIAGNOSIS — I5033 Acute on chronic diastolic (congestive) heart failure: Secondary | ICD-10-CM | POA: Diagnosis not present

## 2020-06-29 DIAGNOSIS — J441 Chronic obstructive pulmonary disease with (acute) exacerbation: Secondary | ICD-10-CM | POA: Insufficient documentation

## 2020-06-29 DIAGNOSIS — I110 Hypertensive heart disease with heart failure: Secondary | ICD-10-CM | POA: Insufficient documentation

## 2020-06-29 DIAGNOSIS — Z87891 Personal history of nicotine dependence: Secondary | ICD-10-CM | POA: Diagnosis not present

## 2020-06-29 DIAGNOSIS — R0602 Shortness of breath: Secondary | ICD-10-CM | POA: Diagnosis present

## 2020-06-29 LAB — COMPREHENSIVE METABOLIC PANEL
ALT: 14 U/L (ref 0–44)
AST: 19 U/L (ref 15–41)
Albumin: 4.6 g/dL (ref 3.5–5.0)
Alkaline Phosphatase: 49 U/L (ref 38–126)
Anion gap: 14 (ref 5–15)
BUN: 24 mg/dL — ABNORMAL HIGH (ref 8–23)
CO2: 27 mmol/L (ref 22–32)
Calcium: 9.6 mg/dL (ref 8.9–10.3)
Chloride: 98 mmol/L (ref 98–111)
Creatinine, Ser: 1.1 mg/dL (ref 0.61–1.24)
GFR, Estimated: >60 mL/min (ref >60)
Glucose, Bld: 106 mg/dL — ABNORMAL HIGH (ref 70–99)
Potassium: 3.8 mmol/L (ref 3.5–5.1)
Sodium: 139 mmol/L (ref 135–145)
Total Bilirubin: 0.5 mg/dL (ref 0.3–1.2)
Total Protein: 7.1 g/dL (ref 6.5–8.1)

## 2020-06-29 LAB — CBC WITH DIFFERENTIAL/PLATELET
Abs Immature Granulocytes: 0.03 10*3/uL (ref 0.00–0.07)
Basophils Absolute: 0.1 10*3/uL (ref 0.0–0.1)
Basophils Relative: 1 %
Eosinophils Absolute: 0.8 10*3/uL — ABNORMAL HIGH (ref 0.0–0.5)
Eosinophils Relative: 11 %
HCT: 38.5 % — ABNORMAL LOW (ref 39.0–52.0)
Hemoglobin: 13.3 g/dL (ref 13.0–17.0)
Immature Granulocytes: 0 %
Lymphocytes Relative: 21 %
Lymphs Abs: 1.5 10*3/uL (ref 0.7–4.0)
MCH: 30.2 pg (ref 26.0–34.0)
MCHC: 34.5 g/dL (ref 30.0–36.0)
MCV: 87.5 fL (ref 80.0–100.0)
Monocytes Absolute: 0.8 10*3/uL (ref 0.1–1.0)
Monocytes Relative: 11 %
Neutro Abs: 4.1 10*3/uL (ref 1.7–7.7)
Neutrophils Relative %: 56 %
Platelets: 193 10*3/uL (ref 150–400)
RBC: 4.4 MIL/uL (ref 4.22–5.81)
RDW: 13.8 % (ref 11.5–15.5)
WBC: 7.3 10*3/uL (ref 4.0–10.5)
nRBC: 0 % (ref 0.0–0.2)

## 2020-06-29 LAB — TROPONIN I (HIGH SENSITIVITY): Troponin I (High Sensitivity): 7 ng/L (ref <18)

## 2020-06-29 LAB — BRAIN NATRIURETIC PEPTIDE: B Natriuretic Peptide: 31.2 pg/mL (ref 0.0–100.0)

## 2020-06-29 MED ORDER — PREDNISONE 20 MG PO TABS: 60.0000 mg | ORAL_TABLET | Freq: Every day | ORAL | 0 refills | Status: AC

## 2020-06-29 MED ORDER — METHYLPREDNISOLONE SODIUM SUCC 125 MG IJ SOLR
125.0000 mg | Freq: Once | INTRAMUSCULAR | Status: AC
  Administered 2020-06-29: 125 mg via INTRAVENOUS
  Filled 2020-06-29: qty 2

## 2020-06-29 MED ORDER — ALBUTEROL SULFATE HFA 108 (90 BASE) MCG/ACT IN AERS
4.0000 | INHALATION_SPRAY | Freq: Once | RESPIRATORY_TRACT | Status: AC
  Administered 2020-06-29: 4 via RESPIRATORY_TRACT
  Filled 2020-06-29: qty 6.7

## 2020-06-29 MED ORDER — ALBUTEROL SULFATE (2.5 MG/3ML) 0.083% IN NEBU: 2.5000 mg | INHALATION_SOLUTION | Freq: Four times a day (QID) | RESPIRATORY_TRACT | 12 refills | Status: AC | PRN

## 2020-06-29 MED ORDER — IPRATROPIUM BROMIDE HFA 17 MCG/ACT IN AERS
2.0000 | INHALATION_SPRAY | Freq: Once | RESPIRATORY_TRACT | Status: AC
  Administered 2020-06-29: 2 via RESPIRATORY_TRACT
  Filled 2020-06-29: qty 12.9

## 2020-06-29 NOTE — ED Provider Notes (Signed)
[facility] EMERGENCY DEPT Provider Note   CSN: [id number] Arrival date & time: 06/29/20  1248     History Chief Complaint  Patient presents with   Shortness of Breath    Tanner Chang is a 65 y.o. male.  The history is provided by the patient.  Shortness of Breath Severity:  Mild Onset quality:  Gradual Duration:  3 days Progression:  Unchanged Chronicity:  Recurrent Context comment:  Coughing and SOB especially with exertion. Restarted lasix yesterday. Ran out of nebulizer. COPD hx. Possible CHF Relieved by:  Nothing Worsened by:  Exertion Associated symptoms: cough and wheezing   Associated symptoms: no abdominal pain, no chest pain, no claudication, no ear pain, no fever, no headaches, no rash, no sore throat, no sputum production and no vomiting       Past Medical History:  Diagnosis Date   CHF (congestive heart failure) (HCC)    COPD (chronic obstructive pulmonary disease) (HCC)    Hypertension    Long COVID     Patient Active Problem List   Diagnosis Date Noted   Acute on chronic diastolic CHF (congestive heart failure) (HCC) 03/23/2020   Acute on chronic respiratory failure with hypoxia (HCC) 03/21/2020   Anxiety and depression 03/21/2020   COPD with acute exacerbation (HCC) 02/16/2020   Essential hypertension 02/16/2020   Hypokalemia 02/16/2020   COPD exacerbation (HCC) 02/16/2020   Coronary artery calcification seen on CT scan 12/14/2019    Past Surgical History:  Procedure Laterality Date   BACK SURGERY  1990       No family history on file.  Social History   Tobacco Use   Smoking status: Former    Years: 20.00    Pack years: 0.00    Types: Cigarettes   Smokeless tobacco: Former  Substance Use Topics   Alcohol use: Yes   Drug use: Not Currently    Home Medications Prior to Admission medications   Medication Sig Start Date End Date Taking? Authorizing Provider  predniSONE (DELTASONE) 20 MG tablet Take 3 tablets (60  mg total) by mouth daily for 4 days. 06/29/20 07/03/20 Yes Luceil Herrin, DO  acetaminophen (TYLENOL) 500 MG tablet Take 1,000 mg by mouth every 6 (six) hours as needed for mild pain or headache.    [provider]  albuterol (PROVENTIL) (2.5 MG/3ML) 0.083% nebulizer solution Take 3 mLs (2.5 mg total) by nebulization every 6 (six) hours as needed for wheezing or shortness of breath. 06/29/20   Shelene Krage, DO  albuterol (VENTOLIN HFA) 108 (90 Base) MCG/ACT inhaler Inhale 2 puffs into the lungs every 4 (four) hours as needed for wheezing or shortness of breath. 12/14/19   Milagros Loll, MD  Aspirin-Salicylamide-Caffeine (BC HEADACHE POWDER PO) Take 2 packets by mouth daily as needed (headaches).    [provider]  atorvastatin (LIPITOR) 10 MG tablet Take 10 mg by mouth at bedtime.    [provider]  budesonide-formoterol (SYMBICORT) 160-4.5 MCG/ACT inhaler Inhale 2 puffs into the lungs 2 (two) times daily.    [provider]  buPROPion (WELLBUTRIN XL) 300 MG 24 hr tablet Take 300 mg by mouth daily with breakfast.    [provider]  FLUoxetine (PROZAC) 20 MG capsule Take 20 mg by mouth in the morning.    [provider]  furosemide (LASIX) 40 MG tablet Take 1 tablet (40 mg total) by mouth daily. 03/27/20   Noralee Stain, DO  Ipratropium-Albuterol (COMBIVENT RESPIMAT) 20-100 MCG/ACT AERS respimat Inhale 2  puffs into the lungs daily.    [provider]  losartan (COZAAR) 100 MG tablet Take 100 mg by mouth in the morning.    [provider]  Multiple Vitamins-Minerals (ONE-A-DAY MENS 50+ ADVANTAGE) TABS Take 1 tablet by mouth daily with breakfast.    [provider]  omeprazole (PRILOSEC) 20 MG capsule Take 20 mg by mouth daily before breakfast.    [provider]  OVER THE COUNTER MEDICATION Take 1 tablet by mouth daily. Vitamin d, vitamin c, zinc combo vitamin    [provider]  Oxcarbazepine  (TRILEPTAL) 300 MG tablet Take 300 mg by mouth 2 (two) times daily.    [provider]  potassium chloride SA (KLOR-CON) 20 MEQ tablet Take 1 tablet (20 mEq total) by mouth daily. 05/02/20   Pricilla Loveless, MD  Psyllium (METAMUCIL PO) Take by mouth See admin instructions. Mix 1 tablespoonful of powder into 8 ounces of water and drink once a day as needed for constipation    [provider]    Allergies    Penicillins and Sulfamethoxazole-trimethoprim  Review of Systems   Review of Systems  Constitutional:  Negative for chills and fever.  HENT:  Negative for ear pain and sore throat.   Eyes:  Negative for pain and visual disturbance.  Respiratory:  Positive for cough, shortness of breath and wheezing. Negative for sputum production.   Cardiovascular:  Negative for chest pain, palpitations and claudication.  Gastrointestinal:  Negative for abdominal pain and vomiting.  Genitourinary:  Negative for dysuria and hematuria.  Musculoskeletal:  Negative for arthralgias and back pain.  Skin:  Negative for color change and rash.  Neurological:  Negative for seizures, syncope and headaches.  All other systems reviewed and are negative.  Physical Exam Updated Vital Signs BP 126/90   Pulse (!) 102   Temp 99.4 F (37.4 C) (Oral)   Resp 14   Wt 82.6 kg   SpO2 94%   BMI 29.38 kg/m   Physical Exam Vitals and nursing note reviewed.  Constitutional:      Appearance: He is well-developed.  HENT:     Head: Normocephalic and atraumatic.     Mouth/Throat:     Mouth: Mucous membranes are moist.  Eyes:     Conjunctiva/sclera: Conjunctivae normal.     Pupils: Pupils are equal, round, and reactive to light.  Cardiovascular:     Rate and Rhythm: Normal rate and regular rhythm.     Pulses: Normal pulses.     Heart sounds: Normal heart sounds. No murmur heard. Pulmonary:     Effort: Pulmonary effort is normal. No respiratory distress.     Breath sounds: Wheezing present. No  decreased breath sounds.  Abdominal:     Palpations: Abdomen is soft.     Tenderness: There is no abdominal tenderness.  Musculoskeletal:     Cervical back: Normal range of motion and neck supple.     Right lower leg: Edema (1+) present.     Left lower leg: Edema (1+) present.  Skin:    General: Skin is warm and dry.     Capillary Refill: Capillary refill takes less than 2 seconds.  Neurological:     General: No focal deficit present.     Mental Status: He is alert.  Psychiatric:        Mood and Affect: Mood normal.    ED Results / Procedures / Treatments   Labs (all labs ordered are listed, but only abnormal results  are displayed) Labs Reviewed  CBC WITH DIFFERENTIAL/PLATELET - Abnormal; Notable for the following components:      Result Value   HCT 38.5 (*)    Eosinophils Absolute 0.8 (*)    All other components within normal limits  COMPREHENSIVE METABOLIC PANEL - Abnormal; Notable for the following components:   Glucose, Bld 106 (*)    BUN 24 (*)    All other components within normal limits  BRAIN NATRIURETIC PEPTIDE  TROPONIN I (HIGH SENSITIVITY)    EKG EKG Interpretation  Date/Time:  Friday June 29 2020 12:55:24 EDT Ventricular Rate:  102 PR Interval:  148 QRS Duration: 90 QT Interval:  331 QTC Calculation: 432 R Axis:   53 Text Interpretation: Sinus tachycardia Probable left atrial enlargement Confirmed by Virgina Norfolkuratolo, Ahliya Glatt ([id number]) on 06/29/2020 1:05:24 PM  Radiology DG Chest Portable 1 View  Result Date: 06/29/2020 CLINICAL DATA:  Shortness of breath with syncope EXAM: PORTABLE CHEST 1 VIEW COMPARISON:  May 24, 2020 FINDINGS: Small left pleural effusion with left base atelectasis. Lungs elsewhere are clear. Heart size and pulmonary vascularity are normal. No adenopathy. No bone lesions. IMPRESSION: Small left pleural effusion with left base atelectasis. Lungs elsewhere clear. Cardiac silhouette within normal limits. Electronically Signed   By: Bretta BangWilliam  Woodruff III  M.D.   On: 06/29/2020 14:03    Procedures Procedures   Medications Ordered in ED Medications  albuterol (VENTOLIN HFA) 108 (90 Base) MCG/ACT inhaler 4 puff (4 puffs Inhalation Given 06/29/20 1306)  ipratropium (ATROVENT HFA) inhaler 2 puff (2 puffs Inhalation Given 06/29/20 1306)  methylPREDNISolone sodium succinate (SOLU-MEDROL) 125 mg/2 mL injection 125 mg (125 mg Intravenous Given 06/29/20 1323)    ED Course  I have reviewed the triage vital signs and the nursing notes.  Pertinent labs & imaging results that were available during my care of the patient were reviewed by me and considered in my medical decision making (see chart for details).    MDM Rules/Calculators/A&P                          Tanner Chang is here with shortness of breath.  History of COPD possibly heart failure.  Normal vitals.  No fever.  Has had shortness of breath last several days worse with exhaustion.  Has run out of nebulizer.  Has been coughing and wheezing.  Has had some fluid buildup in his legs.  He had previously been on Lasix but that has been stopped for the last week and restarted yesterday.  Echocardiogram upon chart review several months ago was overall unremarkable.  No major systolic heart failure.  Does have some swelling on his legs bilaterally but no abdominal pain.  Denies any liver or kidney disease.  He has wheezing throughout on exam with some diminished air movement.  Overall suspect a COPD exacerbation.  But will check troponin, BNP, basic labs.  Will get chest x-ray.  Will give breathing treatment and steroids and reevaluate.  He is on room air and overall appears comfortable breathing.  Chest x-ray shows no obvious pneumonia.  Maybe a small pleural effusion.  No significant leukocytosis, anemia, electrolyte abnormality.  Troponin and BNP are unremarkable.  Doubt ACS or heart failure.  Overall suspect COPD exacerbation and overall mild symptoms.  Felt better after breathing treatments.  Will  prescribe his nebulizer solution again and give him a course of steroids.  Discharged in good condition.  This chart was dictated using voice recognition software.  Despite best efforts to proofread,  errors can occur which can change the documentation meaning.   Final Clinical Impression(s) / ED Diagnoses Final diagnoses:  COPD exacerbation (HCC)    Rx / DC Orders ED Discharge Orders          Ordered    albuterol (PROVENTIL) (2.5 MG/3ML) 0.083% nebulizer solution  Every 6 hours PRN        06/29/20 1406    predniSONE (DELTASONE) 20 MG tablet  Daily        06/29/20 1407             Virgina Norfolk, DO 06/29/20 1423

## 2020-06-29 NOTE — ED Triage Notes (Signed)
Pt was coughing so hard at work that he passed out,unsure how long he was out, doesn't think very long. Pts report he is having difficulty breathing. Was here in May and was treated with steroids and antibiotics

## 2020-07-05 ENCOUNTER — Telehealth (HOSPITAL_COMMUNITY): Payer: Self-pay

## 2020-07-05 NOTE — Telephone Encounter (Signed)
Called patient to see if he was interested in participating in the Pulmonary Rehab Program. Patient stated yes. Patient will come in for orientation on 07/09/20 @ 9AM and will attend the 10:15AM exercise class.   Pensions consultant.

## 2020-07-09 ENCOUNTER — Other Ambulatory Visit: Payer: Self-pay

## 2020-07-09 ENCOUNTER — Encounter (HOSPITAL_COMMUNITY)
Admission: RE | Admit: 2020-07-09 | Discharge: 2020-07-09 | Disposition: A | Discharge status: Home / Self Care | Payer: No Typology Code available for payment source | Source: Ambulatory Visit | Attending: Cardiology | Admitting: Cardiology

## 2020-07-09 VITALS — BP 112/60 | HR 102 | Ht 66.0 in | Wt 185.6 lb

## 2020-07-09 DIAGNOSIS — J449 Chronic obstructive pulmonary disease, unspecified: Secondary | ICD-10-CM | POA: Diagnosis present

## 2020-07-09 NOTE — Progress Notes (Signed)
Pulmonary Individual Treatment Plan  Patient Details  Name: Tanner Chang MRN: [id number] Date of Birth: 20-Mar-1955 Referring Provider:   Doristine Devoid Pulmonary Rehab Walk Test from 07/09/2020 in [facility] CARDIAC [facility]  Referring Provider [place]. Shelle Iron ([place])       Initial Encounter Date:  Flowsheet Row Pulmonary Rehab Walk Test from 07/09/2020 in [facility] CARDIAC REHAB  Date 07/09/20       Visit Diagnosis: Chronic obstructive pulmonary disease, unspecified COPD type (HCC)  Patient's Home Medications on Admission:   Current Outpatient Medications:    acetaminophen (TYLENOL) 500 MG tablet, Take 1,000 mg by mouth every 6 (six) hours as needed for mild pain or headache., Disp: , Rfl:    albuterol (PROVENTIL) (2.5 MG/3ML) 0.083% nebulizer solution, Take 3 mLs (2.5 mg total) by nebulization every 6 (six) hours as needed for wheezing or shortness of breath., Disp: 75 mL, Rfl: 12   albuterol (VENTOLIN HFA) 108 (90 Base) MCG/ACT inhaler, Inhale 2 puffs into the lungs every 4 (four) hours as needed for wheezing or shortness of breath., Disp: 6.7 g, Rfl: 0   atorvastatin (LIPITOR) 10 MG tablet, Take 10 mg by mouth at bedtime., Disp: , Rfl:    budesonide-formoterol (SYMBICORT) 160-4.5 MCG/ACT inhaler, Inhale 2 puffs into the lungs 2 (two) times daily., Disp: , Rfl:    buPROPion (WELLBUTRIN XL) 300 MG 24 hr tablet, Take 300 mg by mouth daily with breakfast., Disp: , Rfl:    FLUoxetine (PROZAC) 20 MG capsule, Take 20 mg by mouth in the morning., Disp: , Rfl:    furosemide (LASIX) 40 MG tablet, Take 1 tablet (40 mg total) by mouth daily., Disp: 30 tablet, Rfl: 2   Ipratropium-Albuterol (COMBIVENT RESPIMAT) 20-100 MCG/ACT AERS respimat, Inhale 2 puffs into the lungs daily., Disp: , Rfl:    losartan (COZAAR) 100 MG tablet, Take 100 mg by mouth in the morning., Disp: , Rfl:    Multiple Vitamins-Minerals (ONE-A-DAY MENS 50+ ADVANTAGE) TABS, Take 1 tablet by mouth  daily with breakfast., Disp: , Rfl:    omeprazole (PRILOSEC) 20 MG capsule, Take 20 mg by mouth daily before breakfast., Disp: , Rfl:    OVER THE COUNTER MEDICATION, Take 1 tablet by mouth daily. Vitamin d, vitamin c, zinc combo vitamin, Disp: , Rfl:    Oxcarbazepine (TRILEPTAL) 300 MG tablet, Take 300 mg by mouth 2 (two) times daily., Disp: , Rfl:    potassium chloride SA (KLOR-CON) 20 MEQ tablet, Take 1 tablet (20 mEq total) by mouth daily., Disp: 3 tablet, Rfl: 0   Psyllium (METAMUCIL PO), Take by mouth See admin instructions. Mix 1 tablespoonful of powder into 8 ounces of water and drink once a day as needed for constipation, Disp: , Rfl:    Aspirin-Salicylamide-Caffeine (BC HEADACHE POWDER PO), Take 2 packets by mouth daily as needed (headaches). (Patient not taking: Reported on 07/09/2020), Disp: , Rfl:   Past Medical History: Past Medical History:  Diagnosis Date   CHF (congestive heart failure) (HCC)    COPD (chronic obstructive pulmonary disease) (HCC)    Hypertension    Long COVID     Tobacco Use: Social History   Tobacco Use  Smoking Status Former   Years: 20.00   Pack years: 0.00   Types: Cigarettes  Smokeless Tobacco Former    Labs: Recent Lobbyist for [employer] Cardiac and Pulmonary Rehab Latest Ref Rng & Units 02/18/2020   PHART 7.350 - 7.450 7.353  PCO2ART 32.0 - 48.0 mmHg 55.1(H)   HCO3 20.0 - 28.0 mmol/L 29.8(H)   O2SAT % 96.0       Capillary Blood Glucose: Lab Results  Component Value Date   GLUCAP 152 (H) 03/21/2020     Pulmonary Assessment Scores:  Pulmonary Assessment Scores     Row Name 07/09/20 1008 07/09/20 1136 07/09/20 1318     ADL UCSD   ADL Phase Entry Entry Entry   SOB Score total 72 -- 72     CAT Score   CAT Score 26 -- 26     mMRC Score   mMRC Score -- 3 --           UCSD: Self-administered rating of dyspnea associated with activities of daily living (ADLs) 6-point scale (0 = "not at all" to 5 =  "maximal or unable to do because of breathlessness")  Scoring Scores range from 0 to 120.  Minimally important difference is 5 units  CAT: CAT can identify the health impairment of COPD patients and is better correlated with disease progression.  CAT has a scoring range of zero to 40. The CAT score is classified into four groups of low (less than 10), medium (10 - 20), high (21-30) and very high (31-40) based on the impact level of disease on health status. A CAT score over 10 suggests significant symptoms.  A worsening CAT score could be explained by an exacerbation, poor medication adherence, poor inhaler technique, or progression of COPD or comorbid conditions.  CAT MCID is 2 points  mMRC: mMRC (Modified Medical Research Council) Dyspnea Scale is used to assess the degree of baseline functional disability in patients of respiratory disease due to dyspnea. No minimal important difference is established. A decrease in score of 1 point or greater is considered a positive change.   Pulmonary Function Assessment:  Pulmonary Function Assessment - 07/09/20 1322       Breath   Bilateral Breath Sounds Clear    Shortness of Breath Yes;Limiting activity             Exercise Target Goals: Exercise Program Goal: Individual exercise prescription set using results from initial 6 min walk test and THRR while considering  patient's activity barriers and safety.   Exercise Prescription Goal: Initial exercise prescription builds to 30-45 minutes a day of aerobic activity, 2-3 days per week.  Home exercise guidelines will be given to patient during program as part of exercise prescription that the participant will acknowledge.  Activity Barriers & Risk Stratification:  Activity Barriers & Cardiac Risk Stratification - 07/09/20 0940       Activity Barriers & Cardiac Risk Stratification   Activity Barriers Back Problems;Deconditioning;Muscular Weakness;Shortness of Breath             6  Minute Walk:  6 Minute Walk     Row Name 07/09/20 1130         6 Minute Walk   Phase Initial     Distance 1177 feet     Walk Time 6 minutes     # of Rest Breaks 0     MPH 2.23     METS 3.37     RPE 11     Perceived Dyspnea  3     VO2 Peak 11.79     Symptoms No     Resting HR 91 bpm     Resting BP 92/60     Resting Oxygen Saturation  95 %     Exercise  Oxygen Saturation  during 6 min walk 92 %     Max Ex. HR 157 bpm     Max Ex. BP 118/76     2 Minute Post BP 98/64           Interval HR     1 Minute HR 130     2 Minute HR 139     3 Minute HR 129     4 Minute HR 127     5 Minute HR 157     6 Minute HR 123     2 Minute Post HR 96     Interval Heart Rate? Yes           Interval Oxygen     Interval Oxygen? Yes     Baseline Oxygen Saturation % 95 %     1 Minute Oxygen Saturation % 93 %     1 Minute Liters of Oxygen 0 L     2 Minute Oxygen Saturation % 93 %     2 Minute Liters of Oxygen 0 L     3 Minute Oxygen Saturation % 93 %     3 Minute Liters of Oxygen 0 L     4 Minute Oxygen Saturation % 92 %     4 Minute Liters of Oxygen 0 L     5 Minute Oxygen Saturation % 92 %     5 Minute Liters of Oxygen 0 L     6 Minute Oxygen Saturation % 92 %     6 Minute Liters of Oxygen 0 L     2 Minute Post Oxygen Saturation % 97 %     2 Minute Post Liters of Oxygen 0 L             Oxygen Initial Assessment:  Oxygen Initial Assessment - 07/09/20 0944       Home Oxygen   Home Oxygen Device None    Sleep Oxygen Prescription None    Home Exercise Oxygen Prescription None    Home Resting Oxygen Prescription None      Initial 6 min Walk   Oxygen Used None      Program Oxygen Prescription   Program Oxygen Prescription None      Intervention   Short Term Goals To learn and understand importance of monitoring SPO2 with pulse oximeter and demonstrate accurate use of the pulse oximeter.;To learn and understand importance of maintaining oxygen saturations>88%;To learn and  demonstrate proper pursed lip breathing techniques or other breathing techniques. ;To learn and demonstrate proper use of respiratory medications    Long  Term Goals Verbalizes importance of monitoring SPO2 with pulse oximeter and return demonstration;Maintenance of O2 saturations>88%;Exhibits proper breathing techniques, such as pursed lip breathing or other method taught during program session;Compliance with respiratory medication;Demonstrates proper use of MDI's             Oxygen Re-Evaluation:   Oxygen Discharge (Final Oxygen Re-Evaluation):   Initial Exercise Prescription:  Initial Exercise Prescription - 07/09/20 1100       Date of Initial Exercise RX and Referring Provider   Date 07/09/20    Referring Provider Dr. Shelle Iron ([place])    Expected Discharge Date 07/09/20      Treadmill   MPH 1.6    Grade 0    Minutes 15      NuStep   Level 2    SPM 80    Minutes 15      Prescription Details  Frequency (times per week) 2    Duration Progress to 30 minutes of continuous aerobic without signs/symptoms of physical distress      Intensity   THRR 40-80% of Max Heartrate 63-126    Ratings of Perceived Exertion 11-13    Perceived Dyspnea 0-4      Progression   Progression Continue to progress workloads to maintain intensity without signs/symptoms of physical distress.      Resistance Training   Training Prescription Yes    Weight Blue bands    Reps 10-15             Perform Capillary Blood Glucose checks as needed.  Exercise Prescription Changes:   Exercise Comments:   Exercise Goals and Review:   Exercise Goals     Row Name 07/09/20 1129             Exercise Goals   Increase Physical Activity Yes       Intervention Provide advice, education, support and counseling about physical activity/exercise needs.;Develop an individualized exercise prescription for aerobic and resistive training based on initial evaluation findings, risk stratification,  comorbidities and participant's personal goals.       Expected Outcomes Short Term: Attend rehab on a regular basis to increase amount of physical activity.;Long Term: Add in home exercise to make exercise part of routine and to increase amount of physical activity.;Long Term: Exercising regularly at least 3-5 days a week.       Increase Strength and Stamina Yes       Intervention Provide advice, education, support and counseling about physical activity/exercise needs.;Develop an individualized exercise prescription for aerobic and resistive training based on initial evaluation findings, risk stratification, comorbidities and participant's personal goals.       Expected Outcomes Short Term: Increase workloads from initial exercise prescription for resistance, speed, and METs.;Short Term: Perform resistance training exercises routinely during rehab and add in resistance training at home;Long Term: Improve cardiorespiratory fitness, muscular endurance and strength as measured by increased METs and functional capacity ( )       Able to understand and use rate of perceived exertion (RPE) scale Yes       Intervention Provide education and explanation on how to use RPE scale       Expected Outcomes Short Term: Able to use RPE daily in rehab to express subjective intensity level;Long Term:  Able to use RPE to guide intensity level when exercising independently       Able to understand and use Dyspnea scale Yes       Intervention Provide education and explanation on how to use Dyspnea scale       Expected Outcomes Short Term: Able to use Dyspnea scale daily in rehab to express subjective sense of shortness of breath during exertion;Long Term: Able to use Dyspnea scale to guide intensity level when exercising independently       Knowledge and understanding of Target Heart Rate Range (THRR) Yes       Intervention Provide education and explanation of THRR including how the numbers were predicted and where they  are located for reference       Expected Outcomes Short Term: Able to state/look up THRR;Long Term: Able to use THRR to govern intensity when exercising independently;Short Term: Able to use daily as guideline for intensity in rehab       Understanding of Exercise Prescription Yes       Intervention Provide education, explanation, and written materials on patient's individual exercise prescription  Expected Outcomes Short Term: Able to explain program exercise prescription;Long Term: Able to explain home exercise prescription to exercise independently                Exercise Goals Re-Evaluation :   Discharge Exercise Prescription (Final Exercise Prescription Changes):   Nutrition:  Target Goals: Understanding of nutrition guidelines, daily intake of sodium 1500mg , cholesterol 200mg , calories 30% from fat and 7% or less from saturated fats, daily to have 5 or more servings of fruits and vegetables.  Biometrics:  Pre Biometrics - 07/09/20 0941       Pre Biometrics   Height  (1.676 m)    Weight 84.2 kg    BMI (Calculated) 29.98    Grip Strength 32 kg              Nutrition Therapy Plan and Nutrition Goals:   Nutrition Assessments:  MEDIFICTS Score Key: ?70 Need to make dietary changes  40-70 Heart Healthy Diet ? 40 Therapeutic Level Cholesterol Diet   Picture Your Plate Scores: <16 Unhealthy dietary pattern with much room for improvement. 41-50 Dietary pattern unlikely to meet recommendations for good health and room for improvement. 51-60 More healthful dietary pattern, with some room for improvement.  >60 Healthy dietary pattern, although there may be some specific behaviors that could be improved.    Nutrition Goals Re-Evaluation:   Nutrition Goals Discharge (Final Nutrition Goals Re-Evaluation):   Psychosocial: Target Goals: Acknowledge presence or absence of significant depression and/or stress, maximize coping skills, provide positive  support system. Participant is able to verbalize types and ability to use techniques and skills needed for reducing stress and depression.  Initial Review & Psychosocial Screening:  Initial Psych Review & Screening - 07/09/20 1323       Initial Review   Current issues with --   Talks every 6 months to either a psychiatrist or pharmacologist at the [employer] and takes wellbutrin and it keeps his depression stable.            Quality of Life Scores:  Scores of 19 and below usually indicate a poorer quality of life in these areas.  A difference of  2-3 points is a clinically meaningful difference.  A difference of 2-3 points in the total score of the Quality of Life Index has been associated with significant improvement in overall quality of life, self-image, physical symptoms, and general health in studies assessing change in quality of life.  PHQ-9: Recent Review Flowsheet Data     Depression screen [facility] 2/9 07/09/2020 07/09/2020   Decreased Interest 0 -   Down, Depressed, Hopeless 0 0   PHQ - 2 Score 0 0   Altered sleeping 3  -   Tired, decreased energy 0 -   Change in appetite 0 -   Feeling bad or failure about yourself  0 -   Trouble concentrating 0 -   Moving slowly or fidgety/restless 0 -   Suicidal thoughts 0 -   PHQ-9 Score 3 -   Difficult doing work/chores Not difficult at all -      Interpretation of Total Score  Total Score Depression Severity:  1-4 = Minimal depression, 5-9 = Mild depression, 10-14 = Moderate depression, 15-19 = Moderately severe depression, 20-27 = Severe depression   Psychosocial Evaluation and Intervention:  Psychosocial Evaluation - 07/09/20 1325       Psychosocial Evaluation & Interventions   Interventions Encouraged to exercise with the program and follow exercise prescription  Comments No concerns identified.    Continue Psychosocial Services  No Follow up required             Psychosocial  Re-Evaluation:   Psychosocial Discharge (Final Psychosocial Re-Evaluation):   Education: Education Goals: Education classes will be provided on a weekly basis, covering required topics. Participant will state understanding/return demonstration of topics presented.  Learning Barriers/Preferences:  Learning Barriers/Preferences - 07/09/20 0950       Learning Barriers/Preferences   Learning Barriers None    Learning Preferences Computer/Internet;Audio;Pictoral;Video             Education Topics: Risk Factor Reduction:  -Group instruction that is supported by a PowerPoint presentation. Instructor discusses the definition of a risk factor, different risk factors for pulmonary disease, and how the heart and lungs work together.     Nutrition for Pulmonary Patient:  -Group instruction provided by PowerPoint slides, verbal discussion, and written materials to support subject matter. The instructor gives an explanation and review of healthy diet recommendations, which includes a discussion on weight management, recommendations for fruit and vegetable consumption, as well as protein, fluid, caffeine, fiber, sodium, sugar, and alcohol. Tips for eating when patients are short of breath are discussed.   Pursed Lip Breathing:  -Group instruction that is supported by demonstration and informational handouts. Instructor discusses the benefits of pursed lip and diaphragmatic breathing and detailed demonstration on how to preform both.     Oxygen Safety:  -Group instruction provided by PowerPoint, verbal discussion, and written material to support subject matter. There is an overview of "What is Oxygen" and "Why do we need it".  Instructor also reviews how to create a safe environment for oxygen use, the importance of using oxygen as prescribed, and the risks of noncompliance. There is a brief discussion on traveling with oxygen and resources the patient may utilize.   Oxygen Equipment:   -Group instruction provided by [facility] utilizing handouts, written materials, and equipment demonstrations.   Signs and Symptoms:  -Group instruction provided by written material and verbal discussion to support subject matter. Warning signs and symptoms of infection, stroke, and heart attack are reviewed and when to call the physician/911 reinforced. Tips for preventing the spread of infection discussed.   Advanced Directives:  -Group instruction provided by verbal instruction and written material to support subject matter. Instructor reviews Advanced Directive laws and proper instruction for filling out document.   Pulmonary Video:  -Group video education that reviews the importance of medication and oxygen compliance, exercise, good nutrition, pulmonary hygiene, and pursed lip and diaphragmatic breathing for the pulmonary patient.   Exercise for the Pulmonary Patient:  -Group instruction that is supported by a PowerPoint presentation. Instructor discusses benefits of exercise, core components of exercise, frequency, duration, and intensity of an exercise routine, importance of utilizing pulse oximetry during exercise, safety while exercising, and options of places to exercise outside of rehab.     Pulmonary Medications:  -Verbally interactive group education provided by instructor with focus on inhaled medications and proper administration.   Anatomy and Physiology of the Respiratory System and Intimacy:  -Group instruction provided by PowerPoint, verbal discussion, and written material to support subject matter. Instructor reviews respiratory cycle and anatomical components of the respiratory system and their functions. Instructor also reviews differences in obstructive and restrictive respiratory diseases with examples of each. Intimacy, Sex, and Sexuality differences are reviewed with a discussion on how relationships can change when diagnosed with pulmonary disease. Common  sexual concerns are  reviewed.   MD DAY -A group question and answer session with a medical doctor that allows participants to ask questions that relate to their pulmonary disease state.   OTHER EDUCATION -Group or individual verbal, written, or video instructions that support the educational goals of the pulmonary rehab program.   Holiday Eating Survival Tips:  -Group instruction provided by PowerPoint slides, verbal discussion, and written materials to support subject matter. The instructor gives patients tips, tricks, and techniques to help them not only survive but enjoy the holidays despite the onslaught of food that accompanies the holidays.   Knowledge Questionnaire Score:  Knowledge Questionnaire Score - 07/09/20 1325       Knowledge Questionnaire Score   Pre Score 9/18             Core Components/Risk Factors/Patient Goals at Admission:  Personal Goals and Risk Factors at Admission - 07/09/20 1326       Core Components/Risk Factors/Patient Goals on Admission   Improve shortness of breath with ADL's Yes    Intervention Provide education, individualized exercise plan and daily activity instruction to help decrease symptoms of SOB with activities of daily living.    Expected Outcomes Short Term: Improve cardiorespiratory fitness to achieve a reduction of symptoms when performing ADLs;Long Term: Be able to perform more ADLs without symptoms or delay the onset of symptoms             Core Components/Risk Factors/Patient Goals Review:   Goals and Risk Factor Review     Row Name 07/09/20 1326             Core Components/Risk Factors/Patient Goals Review   Personal Goals Review Develop more efficient breathing techniques such as purse lipped breathing and diaphragmatic breathing and practicing self-pacing with activity.;Increase knowledge of respiratory medications and ability to use respiratory devices properly.;Improve shortness of breath with ADL's                 Core Components/Risk Factors/Patient Goals at Discharge (Final Review):   Goals and Risk Factor Review - 07/09/20 1326       Core Components/Risk Factors/Patient Goals Review   Personal Goals Review Develop more efficient breathing techniques such as purse lipped breathing and diaphragmatic breathing and practicing self-pacing with activity.;Increase knowledge of respiratory medications and ability to use respiratory devices properly.;Improve shortness of breath with ADL's             ITP Comments:   Comments:

## 2020-07-09 NOTE — Progress Notes (Signed)
Tanner Chang 65 y.o. male Pulmonary Rehab Orientation Note This patient who was referred to Pulmonary rehab by Dr. Shelle Iron from the [facility] with the diagnosis of COPD, unspecified arrived today in Cardiac and Pulmonary Rehab. He arrived ambulatory with normal gait. He does not carry portable oxygen.  Per pt, he uses oxygen never. Color good, skin warm and dry. Patient is oriented to time and place. Patient's medical history, psychosocial health, and medications reviewed. Psychosocial assessment reveals pt  lives in the same household as his mother and it is a positive influence . Pt is currently retired. Pt hobbies include spending time with his mother and taking care of her. Pt reports his stress level is low. Pt does not exhibit signs of depression. He does have difficulty staying asleep, but does not attribute this as a symptom of depression.  He takes wellbutrin daily and his depression is very stable. He talks to a Designer, multimedia every 6 months from the [place] to get his antidepressant prescription renrewed .PHQ2/9 score 0/3. Pt shows good  coping skills with positive outlook .  Offered emotional support and reassurance. Will continue to monitor and evaluate progress toward psychosocial goal(s) of continued mental stability and well-being while partcipating in pulmonary rehab. Physical assessment reveals heart rate is tachycardic, breath sounds clear to auscultation, no wheezes, rales, or rhonchi. Grip strength equal, strong. Distal pulses 2+ bilateral posterior tibial pulses present without peripheral edema. Patient reports he does take medications as prescribed. Patient states he follows a Low Sodium diet.  He has lost 16 pounds over the last few months by following a low sodium diet.. Patient's weight will be monitored closely. Demonstration and practice of PLB using pulse oximeter. Patient able to return demonstration satisfactorily. Safety and hand hygiene in the exercise  area reviewed with patient. Patient voices understanding of the information reviewed. Department expectations discussed with patient and achievable goals were set. The patient shows enthusiasm about attending the program and we look forward to working with this nice gentleman. The patient completed a  6 min walk test on today, 07/09/2020 and to begin exercise on Tuesday, 05/17/2020 @ 1015.  [id number]

## 2020-07-17 ENCOUNTER — Encounter (HOSPITAL_BASED_OUTPATIENT_CLINIC_OR_DEPARTMENT_OTHER): Payer: Self-pay

## 2020-07-17 ENCOUNTER — Emergency Department (HOSPITAL_BASED_OUTPATIENT_CLINIC_OR_DEPARTMENT_OTHER)
Admission: EM | Admit: 2020-07-17 | Discharge: 2020-07-17 | Disposition: A | Discharge status: Home / Self Care | Payer: No Typology Code available for payment source | Attending: Emergency Medicine | Admitting: Emergency Medicine

## 2020-07-17 ENCOUNTER — Encounter (HOSPITAL_COMMUNITY)
Admission: RE | Admit: 2020-07-17 | Discharge: 2020-07-17 | Disposition: A | Discharge status: Home / Self Care | Payer: No Typology Code available for payment source | Source: Ambulatory Visit | Attending: Cardiology | Admitting: Cardiology

## 2020-07-17 ENCOUNTER — Other Ambulatory Visit: Payer: Self-pay

## 2020-07-17 DIAGNOSIS — Z79899 Other long term (current) drug therapy: Secondary | ICD-10-CM | POA: Insufficient documentation

## 2020-07-17 DIAGNOSIS — J449 Chronic obstructive pulmonary disease, unspecified: Secondary | ICD-10-CM | POA: Insufficient documentation

## 2020-07-17 DIAGNOSIS — Z7951 Long term (current) use of inhaled steroids: Secondary | ICD-10-CM | POA: Diagnosis not present

## 2020-07-17 DIAGNOSIS — T43011A Poisoning by tricyclic antidepressants, accidental (unintentional), initial encounter: Secondary | ICD-10-CM | POA: Insufficient documentation

## 2020-07-17 DIAGNOSIS — Z8616 Personal history of COVID-19: Secondary | ICD-10-CM | POA: Diagnosis not present

## 2020-07-17 DIAGNOSIS — Z87891 Personal history of nicotine dependence: Secondary | ICD-10-CM | POA: Insufficient documentation

## 2020-07-17 DIAGNOSIS — T50901A Poisoning by unspecified drugs, medicaments and biological substances, accidental (unintentional), initial encounter: Secondary | ICD-10-CM

## 2020-07-17 DIAGNOSIS — I5033 Acute on chronic diastolic (congestive) heart failure: Secondary | ICD-10-CM | POA: Insufficient documentation

## 2020-07-17 DIAGNOSIS — I110 Hypertensive heart disease with heart failure: Secondary | ICD-10-CM | POA: Diagnosis not present

## 2020-07-17 NOTE — Progress Notes (Signed)
Daily Session Note  Patient Details  Name: Tanner Chang MRN: [id number] Date of Birth: 1955/09/05 Referring Provider:   April Manson [facility] Rehab Walk Test from 07/09/2020 in [place]  Referring Provider Dr. Gwenette Greet ([place])       Encounter Date: 07/17/2020  Check In:  Session Check In - 07/17/20 1147       Check-In   Supervising physician immediately available to respond to emergencies [facility] immediately available    Physician(s) Dr. Alfredia Ferguson    [facility]    Staff Present Rosebud Poles, RN, Milus Glazier, MS, ACSM-CEP, CCRP, Exercise Physiologist;Undrea Archbold Hassell Done, MS, ACSM-CEP, Exercise Physiologist;Lisa Ysidro Evert, RN    Virtual Visit No    Medication changes reported     No    Fall or balance concerns reported    No    Tobacco Cessation No Change    Warm-up and Cool-down Performed as group-led instruction    Resistance Training Performed Yes    VAD Patient? No    PAD/SET Patient? No      Pain Assessment   Currently in Pain? No/denies    Pain Score 0-No pain    Multiple Pain Sites No             Capillary Blood Glucose: No results found for this or any previous visit (from the past 24 hour(s)).    Social History   Tobacco Use  Smoking Status Former   Years: 20.00   Pack years: 0.00   Types: Cigarettes  Smokeless Tobacco Former    Goals Met:  Proper associated with RPD/PD & O2 Sat Exercise tolerated well No report of cardiac concerns or symptoms Strength training completed today  Goals Unmet:  Not Applicable  Comments: Service time is from 1015 to 1130 Patient completed first day of exercise and tolerated well with no complaints or concerns.     Dr. Fransico Him is Medical Director for Cardiac Rehab at [facility].

## 2020-07-17 NOTE — ED Triage Notes (Signed)
Pt thought he was taking APAP and then realized he took two 100 mg of trazodone. Pt states he just feels "a little dazed." Pt is A/Ox4 during triage.

## 2020-07-17 NOTE — Discharge Instructions (Addendum)
You took 1 additional tab of your trazodone.  This will likely just cause some excess sleepiness.  Make sure not to drive.  This is not within a significant overdose range.

## 2020-07-17 NOTE — ED Notes (Signed)
Pt provided discharge instructions and prescription information. Pt was given the opportunity to ask questions and questions were answered. Discharge signature not obtained in the setting of the COVID-19 pandemic in order to reduce high touch surfaces.   Pt states he drove himself here. Pt advised that he is not cleared to drive while taking Trazodone. Pt states he will take a cab home.

## 2020-07-17 NOTE — ED Provider Notes (Signed)
[facility] EMERGENCY DEPT Provider Note   CSN: [id number] Arrival date & time: 07/17/20  2228     History Chief Complaint  Patient presents with   Drug Overdose    Accidental    Tanner Chang is a 65 y.o. male.  HPI     This is a 65 year old male with a history of CHF, COPD, hypertension who presents with concerns for overdose.  Patient reports that he got a new bottle of trazodone.  He normally takes 100 mg at night.  He states his trazodone bottle looks identical to his acetaminophen bottle.  He took what he thought was 2 acetaminophen tablets.  However, he realized that he accidentally took to 100 mg trazodone tablets.  This was accidental.  No harm intent.  Currently he is asymptomatic.  No excess sleepiness.  He does feel like his speech is somewhat slurred.  He took this approximately 30 minutes prior to arrival.Denies coingestion or alcohol  Past Medical History:  Diagnosis Date   CHF (congestive heart failure) (HCC)    COPD (chronic obstructive pulmonary disease) (HCC)    Hypertension    Long COVID     Patient Active Problem List   Diagnosis Date Noted   Acute on chronic diastolic CHF (congestive heart failure) (HCC) 03/23/2020   Acute on chronic respiratory failure with hypoxia (HCC) 03/21/2020   Anxiety and depression 03/21/2020   COPD with acute exacerbation (HCC) 02/16/2020   Essential hypertension 02/16/2020   Hypokalemia 02/16/2020   COPD exacerbation (HCC) 02/16/2020   Coronary artery calcification seen on CT scan 12/14/2019    Past Surgical History:  Procedure Laterality Date   BACK SURGERY  1990       No family history on file.  Social History   Tobacco Use   Smoking status: Former    Years: 20.00    Pack years: 0.00    Types: Cigarettes   Smokeless tobacco: Former  Substance Use Topics   Alcohol use: Yes   Drug use: Not Currently    Home Medications Prior to Admission medications   Medication Sig Start Date End Date  Taking? Authorizing Provider  acetaminophen (TYLENOL) 500 MG tablet Take 1,000 mg by mouth every 6 (six) hours as needed for mild pain or headache.    [provider]  albuterol (PROVENTIL) (2.5 MG/3ML) 0.083% nebulizer solution Take 3 mLs (2.5 mg total) by nebulization every 6 (six) hours as needed for wheezing or shortness of breath. 06/29/20   Curatolo, Adam, DO  albuterol (VENTOLIN HFA) 108 (90 Base) MCG/ACT inhaler Inhale 2 puffs into the lungs every 4 (four) hours as needed for wheezing or shortness of breath. 12/14/19   Milagros Loll, MD  Aspirin-Salicylamide-Caffeine (BC HEADACHE POWDER PO) Take 2 packets by mouth daily as needed (headaches). Patient not taking: Reported on 07/09/2020    [provider]  atorvastatin (LIPITOR) 10 MG tablet Take 10 mg by mouth at bedtime.    [provider]  budesonide-formoterol (SYMBICORT) 160-4.5 MCG/ACT inhaler Inhale 2 puffs into the lungs 2 (two) times daily.    [provider]  buPROPion (WELLBUTRIN XL) 300 MG 24 hr tablet Take 300 mg by mouth daily with breakfast.    [provider]  FLUoxetine (PROZAC) 20 MG capsule Take 20 mg by mouth in the morning.    [provider]  furosemide (LASIX) 40 MG tablet Take 1 tablet (40 mg total) by mouth daily. 03/27/20   Noralee Stain, DO  Ipratropium-Albuterol (COMBIVENT RESPIMAT)  20-100 MCG/ACT AERS respimat Inhale 2 puffs into the lungs daily.    [provider]  losartan (COZAAR) 100 MG tablet Take 100 mg by mouth in the morning.    [provider]  Multiple Vitamins-Minerals (ONE-A-DAY MENS 50+ ADVANTAGE) TABS Take 1 tablet by mouth daily with breakfast.    [provider]  omeprazole (PRILOSEC) 20 MG capsule Take 20 mg by mouth daily before breakfast.    [provider]  OVER THE COUNTER MEDICATION Take 1 tablet by mouth daily. Vitamin d, vitamin c, zinc combo vitamin    [provider]  Oxcarbazepine  (TRILEPTAL) 300 MG tablet Take 300 mg by mouth 2 (two) times daily.    [provider]  potassium chloride SA (KLOR-CON) 20 MEQ tablet Take 1 tablet (20 mEq total) by mouth daily. 05/02/20   Pricilla Loveless, MD  Psyllium (METAMUCIL PO) Take by mouth See admin instructions. Mix 1 tablespoonful of powder into 8 ounces of water and drink once a day as needed for constipation    [provider]    Allergies    Penicillins and Sulfamethoxazole-trimethoprim  Review of Systems   Review of Systems  Constitutional:        Neg for Sleepiness  Neurological:  Positive for speech difficulty.  All other systems reviewed and are negative.  Physical Exam Updated Vital Signs BP 115/83 (BP Location: Left Arm)   Pulse (!) 110   Temp 98.6 F (37 C)   Resp 20   Ht 1.676 m (5\' 6" )   Wt 80.7 kg   SpO2 95%   BMI 28.73 kg/m   Physical Exam Vitals and nursing note reviewed.  Constitutional:      Appearance: He is well-developed. He is not ill-appearing.  HENT:     Head: Normocephalic and atraumatic.     Nose: Nose normal.     Mouth/Throat:     Mouth: Mucous membranes are moist.  Eyes:     Pupils: Pupils are equal, round, and reactive to light.  Cardiovascular:     Rate and Rhythm: Normal rate and regular rhythm.     Heart sounds: Normal heart sounds. No murmur heard. Pulmonary:     Effort: Pulmonary effort is normal. No respiratory distress.     Breath sounds: Normal breath sounds. No wheezing.  Abdominal:     General: Bowel sounds are normal.     Palpations: Abdomen is soft.     Tenderness: There is no abdominal tenderness. There is no rebound.  Musculoskeletal:     Cervical back: Neck supple.     Right lower leg: No edema.     Left lower leg: No edema.  Lymphadenopathy:     Cervical: No cervical adenopathy.  Skin:    General: Skin is warm and dry.  Neurological:     Mental Status: He is alert and oriented to person, place, and time.     Comments: Fluent speech,  cranial nerves II through XII intact, 5 out of 5 strength in all 4 extremities  Psychiatric:        Mood and Affect: Mood normal.    ED Results / Procedures / Treatments   Labs (all labs ordered are listed, but only abnormal results are displayed) Labs Reviewed - No data to display  EKG None  Radiology No results found.  Procedures Procedures   Medications Ordered in ED Medications - No data to display  ED Course  I have reviewed the triage vital signs and  the nursing notes.  Pertinent labs & imaging results that were available during my care of the patient were reviewed by me and considered in my medical decision making (see chart for details).    MDM Rules/Calculators/A&P                          Patient presents with accidental overdose of trazodone.  He took 1 additional 100 mg tablet.  He is currently relatively asymptomatic.  He does feel like his speech is somewhat slurred although on my evaluation he has fluent speech.  He is otherwise neurologically intact.  Vital signs are reassuring.  He is slightly tachycardic but appears quite anxious.  Trazodone comes and tablets of 300 mg.  He is well within the therapeutic dose range although he may have some increased therapeutic effects including increased drowsiness.  Do not feel he needs any work-up for this.  Patient was reassured.  He will call a taxi to take him home.  After history, exam, and medical workup I feel the patient has been appropriately medically screened and is safe for discharge home. Pertinent diagnoses were discussed with the patient. Patient was given return precautions.  Final Clinical Impression(s) / ED Diagnoses Final diagnoses:  Accidental drug overdose, initial encounter    Rx / DC Orders ED Discharge Orders     None        Shon Baton, MD 07/17/20 2321

## 2020-07-19 ENCOUNTER — Other Ambulatory Visit: Payer: Self-pay

## 2020-07-19 ENCOUNTER — Encounter (HOSPITAL_COMMUNITY)
Admission: RE | Admit: 2020-07-19 | Discharge: 2020-07-19 | Disposition: A | Discharge status: Home / Self Care | Payer: No Typology Code available for payment source | Source: Ambulatory Visit | Attending: Cardiology | Admitting: Cardiology

## 2020-07-19 VITALS — Wt 183.9 lb

## 2020-07-19 DIAGNOSIS — J449 Chronic obstructive pulmonary disease, unspecified: Secondary | ICD-10-CM | POA: Diagnosis not present

## 2020-07-19 NOTE — Progress Notes (Addendum)
Tanner Chang 65 y.o. male Nutrition Note  Diagnosis: COPD  Past Medical History:  Diagnosis Date   CHF (congestive heart failure) (HCC)    COPD (chronic obstructive pulmonary disease) (HCC)    Hypertension    Long COVID      Medications reviewed.   Current Outpatient Medications:    acetaminophen (TYLENOL) 500 MG tablet, Take 1,000 mg by mouth every 6 (six) hours as needed for mild pain or headache., Disp: , Rfl:    albuterol (PROVENTIL) (2.5 MG/3ML) 0.083% nebulizer solution, Take 3 mLs (2.5 mg total) by nebulization every 6 (six) hours as needed for wheezing or shortness of breath., Disp: 75 mL, Rfl: 12   albuterol (VENTOLIN HFA) 108 (90 Base) MCG/ACT inhaler, Inhale 2 puffs into the lungs every 4 (four) hours as needed for wheezing or shortness of breath., Disp: 6.7 g, Rfl: 0   Aspirin-Salicylamide-Caffeine (BC HEADACHE POWDER PO), Take 2 packets by mouth daily as needed (headaches). (Patient not taking: Reported on 07/09/2020), Disp: , Rfl:    atorvastatin (LIPITOR) 10 MG tablet, Take 10 mg by mouth at bedtime., Disp: , Rfl:    budesonide-formoterol (SYMBICORT) 160-4.5 MCG/ACT inhaler, Inhale 2 puffs into the lungs 2 (two) times daily., Disp: , Rfl:    buPROPion (WELLBUTRIN XL) 300 MG 24 hr tablet, Take 300 mg by mouth daily with breakfast., Disp: , Rfl:    FLUoxetine (PROZAC) 20 MG capsule, Take 20 mg by mouth in the morning., Disp: , Rfl:    furosemide (LASIX) 40 MG tablet, Take 1 tablet (40 mg total) by mouth daily., Disp: 30 tablet, Rfl: 2   Ipratropium-Albuterol (COMBIVENT RESPIMAT) 20-100 MCG/ACT AERS respimat, Inhale 2 puffs into the lungs daily., Disp: , Rfl:    losartan (COZAAR) 100 MG tablet, Take 100 mg by mouth in the morning., Disp: , Rfl:    Multiple Vitamins-Minerals (ONE-A-DAY MENS 50+ ADVANTAGE) TABS, Take 1 tablet by mouth daily with breakfast., Disp: , Rfl:    omeprazole (PRILOSEC) 20 MG capsule, Take 20 mg by mouth daily before breakfast., Disp: , Rfl:    OVER  THE COUNTER MEDICATION, Take 1 tablet by mouth daily. Vitamin d, vitamin c, zinc combo vitamin, Disp: , Rfl:    Oxcarbazepine (TRILEPTAL) 300 MG tablet, Take 300 mg by mouth 2 (two) times daily., Disp: , Rfl:    potassium chloride SA (KLOR-CON) 20 MEQ tablet, Take 1 tablet (20 mEq total) by mouth daily., Disp: 3 tablet, Rfl: 0   Psyllium (METAMUCIL PO), Take by mouth See admin instructions. Mix 1 tablespoonful of powder into 8 ounces of water and drink once a day as needed for constipation, Disp: , Rfl:    Ht Readings from Last 1 Encounters:  07/17/20 5\' 6"  (1.676 m)     Wt Readings from Last 3 Encounters:  07/17/20 178 lb (80.7 kg)  07/09/20 185 lb 10 oz (84.2 kg)  06/29/20 182 lb (82.6 kg)     There is no height or weight on file to calculate BMI.   Social History   Tobacco Use  Smoking Status Former   Years: 20.00   Pack years: 0.00   Types: Cigarettes  Smokeless Tobacco Former      Nutrition Note  Spoke with pt. Nutrition Plan and Nutrition Survey goals reviewed with pt. Pt is following a Heart Healthy diet. Pt wants to lose wt. Pt has been trying to lose wt by reducing sodium intake to 1200 mg/day. He has already lost 26 lbs (209 lbs 8-10  weeks ago). He has been sedentary until starting pulmonary rehab last week and now is walking at home as well. We discussed healthy/sustainable weight loss and appropriate sodium restriction.  Pt with suspected CHF. Per discussion, pt does not use canned/convenience foods often. Pt does not add salt to food. Pt does not eat out frequently. He reports lower leg edema. He states it has gotten better recently. No daily weights. He is off lasix now.  Drinking 50 oz fluid each day.  Taking vitamin d supplement daily.  Pt reports chronic diarrhea. His GI doc has prescribed metamucil daily. Pt reports no relief from symptoms.  Pt expressed understanding of the information reviewed.    Nutrition Diagnosis Food-and nutrition-related knowledge  deficit related to lack of exposure to information as related to diagnosis of: ? COPD, CHF  Nutrition Intervention Pt's individual nutrition plan reviewed with pt.  1500 mg sodium, TLC   Continue client-centered nutrition education by RD, as part of interdisciplinary care.  Goal(s) Pt to identify food quantities necessary to achieve weight loss of 6-24 lb at graduation from pulmonary rehab.  Pt to build a healthy plate including vegetables, fruits, whole grains, and low-fat dairy products in a heart healthy meal plan. Pt to read labels and monitor sodium intake to meet 1500 mg/day   Plan:  Will provide client-centered nutrition education as part of interdisciplinary care Monitor and evaluate progress toward nutrition goal with team.   Andrey Campanile, MS, RDN, LDN

## 2020-07-19 NOTE — Progress Notes (Signed)
Daily Session Note  Patient Details  Name: Tanner Chang MRN: [id number] Date of Birth: Jun 01, 1955 Referring Provider:   April Manson Pulmonary Rehab Walk Test from 07/09/2020 in [place]  Referring Provider Dr. Gwenette Greet ([place])       Encounter Date: 07/19/2020  Check In:  Session Check In - 07/19/20 1158       Check-In   Supervising physician immediately available to respond to emergencies [facility] immediately available    Physician(s) Dr. Cruzita Lederer    [facility]    Staff Present Rosebud Poles, RN, BSN;Carlette Wilber Oliphant, RN, Isaac Laud, MS, ACSM-CEP, Exercise Physiologist;Rainee Sweatt Ysidro Evert, RN    Virtual Visit No    Medication changes reported     No    Fall or balance concerns reported    No    Tobacco Cessation No Change    Warm-up and Cool-down Performed as group-led instruction    Resistance Training Performed Yes    VAD Patient? No      Pain Assessment   Currently in Pain? No/denies    Pain Score 0-No pain    Multiple Pain Sites No             Capillary Blood Glucose: No results found for this or any previous visit (from the past 24 hour(s)).    Social History   Tobacco Use  Smoking Status Former   Years: 20.00   Pack years: 0.00   Types: Cigarettes  Smokeless Tobacco Former    Goals Met:  Exercise tolerated well No report of cardiac concerns or symptoms Strength training completed today  Goals Unmet:  Not Applicable  Comments: Service time is from 1015 to 1134    Dr. Fransico Him is Market researcher for Cardiac Rehab at [facility].

## 2020-07-24 ENCOUNTER — Encounter (HOSPITAL_COMMUNITY): Payer: No Typology Code available for payment source

## 2020-07-26 ENCOUNTER — Encounter (HOSPITAL_COMMUNITY)
Admission: RE | Admit: 2020-07-26 | Discharge: 2020-07-26 | Disposition: A | Discharge status: Home / Self Care | Payer: No Typology Code available for payment source | Source: Ambulatory Visit | Attending: Cardiology | Admitting: Cardiology

## 2020-07-26 ENCOUNTER — Other Ambulatory Visit: Payer: Self-pay

## 2020-07-26 DIAGNOSIS — J449 Chronic obstructive pulmonary disease, unspecified: Secondary | ICD-10-CM

## 2020-07-26 NOTE — Progress Notes (Signed)
Daily Session Note  Patient Details  Name: Tanner Chang MRN: [id number] Date of Birth: 1955/09/08 Referring Provider:   April Manson Pulmonary Rehab Walk Test from 07/09/2020 in [place]  Referring Provider Dr. Gwenette Greet ([place])       Encounter Date: 07/26/2020  Check In:  Session Check In - 07/26/20 1128       Check-In   Supervising physician immediately available to respond to emergencies [facility] immediately available    Physician(s) Dr. Kurtis Bushman    [facility]    Staff Present Esmeralda Links BS, ACSM EP-C, Exercise Physiologist;Yailyn Strack Jani Gravel, MS, ACSM-CEP, Exercise Physiologist    Virtual Visit No    Medication changes reported     No    Fall or balance concerns reported    No    Tobacco Cessation No Change    Warm-up and Cool-down Performed as group-led instruction    Resistance Training Performed Yes    VAD Patient? No    PAD/SET Patient? No      Pain Assessment   Currently in Pain? No/denies    Multiple Pain Sites No             Capillary Blood Glucose: No results found for this or any previous visit (from the past 24 hour(s)).    Social History   Tobacco Use  Smoking Status Former   Years: 20.00   Types: Cigarettes  Smokeless Tobacco Former    Goals Met:  Exercise tolerated well No report of cardiac concerns or symptoms Strength training completed today  Goals Unmet:  Not Applicable  Comments: Service time is from 1013 to 1135     Dr. Fransico Him is Medical Director for Cardiac Rehab at [facility].

## 2020-07-31 ENCOUNTER — Encounter (HOSPITAL_COMMUNITY)
Admission: RE | Admit: 2020-07-31 | Discharge: 2020-07-31 | Disposition: A | Discharge status: Home / Self Care | Payer: No Typology Code available for payment source | Source: Ambulatory Visit | Attending: Cardiology | Admitting: Cardiology

## 2020-07-31 ENCOUNTER — Other Ambulatory Visit: Payer: Self-pay

## 2020-07-31 DIAGNOSIS — J449 Chronic obstructive pulmonary disease, unspecified: Secondary | ICD-10-CM | POA: Diagnosis not present

## 2020-07-31 NOTE — Progress Notes (Signed)
Daily Session Note  Patient Details  Name: Tanner Chang MRN: [id number] Date of Birth: 1955/10/27 Referring Provider:   April Manson Pulmonary Rehab Walk Test from 07/09/2020 in [place]  Referring Provider Dr. Gwenette Greet ([place])       Encounter Date: 07/31/2020  Check In:  Session Check In - 07/31/20 1149       Check-In   Physician(s) Dr. Sabino Gasser    Staff Present Rosebud Poles, RN, Isaac Laud, MS, ACSM-CEP, Exercise Physiologist;Shaira Sova Ysidro Evert, RN    Virtual Visit No    Medication changes reported     No    Fall or balance concerns reported    No    Tobacco Cessation No Change    Warm-up and Cool-down Performed as group-led instruction    Resistance Training Performed Yes    VAD Patient? No    PAD/SET Patient? No      Pain Assessment   Currently in Pain? No/denies    Multiple Pain Sites No             Capillary Blood Glucose: No results found for this or any previous visit (from the past 24 hour(s)).    Social History   Tobacco Use  Smoking Status Former   Years: 20.00   Types: Cigarettes  Smokeless Tobacco Former    Goals Met:  Exercise tolerated well No report of cardiac concerns or symptoms Strength training completed today  Goals Unmet:  Not Applicable  Comments: Service time is from 1018 to 1135    Dr. Fransico Him is Medical Director for Cardiac Rehab at [facility].

## 2020-07-31 NOTE — Progress Notes (Signed)
Pulmonary Individual Treatment Plan  Patient Details  Name: Tanner Chang MRN: [id number] Date of Birth: Feb 19, 1955 Referring Provider:   April Manson Pulmonary Rehab Walk Test from 07/09/2020 in [place]  Referring Provider Dr. Gwenette Greet ([place])       Initial Encounter Date:  Flowsheet Row Pulmonary Rehab Walk Test from 07/09/2020 in [place]  Date 07/09/20       Visit Diagnosis: Chronic obstructive pulmonary disease, unspecified COPD type ([place])  Patient's Home Medications on Admission:   Current Outpatient Medications:    acetaminophen (TYLENOL) 500 MG tablet, Take 1,000 mg by mouth every 6 (six) hours as needed for mild pain or headache., Disp: , Rfl:    albuterol (PROVENTIL) (2.5 MG/3ML) 0.083% nebulizer solution, Take 3 mLs (2.5 mg total) by nebulization every 6 (six) hours as needed for wheezing or shortness of breath., Disp: 75 mL, Rfl: 12   albuterol (VENTOLIN HFA) 108 (90 Base) MCG/ACT inhaler, Inhale 2 puffs into the lungs every 4 (four) hours as needed for wheezing or shortness of breath., Disp: 6.7 g, Rfl: 0   Aspirin-Salicylamide-Caffeine (BC HEADACHE POWDER PO), Take 2 packets by mouth daily as needed (headaches). (Patient not taking: Reported on 07/09/2020), Disp: , Rfl:    atorvastatin (LIPITOR) 10 MG tablet, Take 10 mg by mouth at bedtime., Disp: , Rfl:    budesonide-formoterol (SYMBICORT) 160-4.5 MCG/ACT inhaler, Inhale 2 puffs into the lungs 2 (two) times daily., Disp: , Rfl:    buPROPion (WELLBUTRIN XL) 300 MG 24 hr tablet, Take 300 mg by mouth daily with breakfast., Disp: , Rfl:    FLUoxetine (PROZAC) 20 MG capsule, Take 20 mg by mouth in the morning., Disp: , Rfl:    furosemide (LASIX) 40 MG tablet, Take 1 tablet (40 mg total) by mouth daily., Disp: 30 tablet, Rfl: 2   Ipratropium-Albuterol (COMBIVENT RESPIMAT) 20-100 MCG/ACT AERS respimat, Inhale 2 puffs into the lungs daily., Disp: , Rfl:     losartan (COZAAR) 100 MG tablet, Take 100 mg by mouth in the morning., Disp: , Rfl:    Multiple Vitamins-Minerals (ONE-A-DAY MENS 50+ ADVANTAGE) TABS, Take 1 tablet by mouth daily with breakfast., Disp: , Rfl:    omeprazole (PRILOSEC) 20 MG capsule, Take 20 mg by mouth daily before breakfast., Disp: , Rfl:    OVER THE COUNTER MEDICATION, Take 1 tablet by mouth daily. Vitamin d, vitamin c, zinc combo vitamin, Disp: , Rfl:    Oxcarbazepine (TRILEPTAL) 300 MG tablet, Take 300 mg by mouth 2 (two) times daily., Disp: , Rfl:    potassium chloride SA (KLOR-CON) 20 MEQ tablet, Take 1 tablet (20 mEq total) by mouth daily., Disp: 3 tablet, Rfl: 0   Psyllium (METAMUCIL PO), Take by mouth See admin instructions. Mix 1 tablespoonful of powder into 8 ounces of water and drink once a day as needed for constipation, Disp: , Rfl:   Past Medical History: Past Medical History:  Diagnosis Date   CHF (congestive heart failure) (HCC)    COPD (chronic obstructive pulmonary disease) (HCC)    Hypertension    Long COVID     Tobacco Use: Social History   Tobacco Use  Smoking Status Former   Years: 20.00   Types: Cigarettes  Smokeless Tobacco Former    Labs: Recent Government social research officer for [facility] and Pulmonary Rehab Latest Ref Rng & Units 02/18/2020   PHART 7.350 - 7.450 7.353   PCO2ART 32.0 -  48.0 mmHg 55.1(H)   HCO3 20.0 - 28.0 mmol/L 29.8(H)   O2SAT % 96.0       Capillary Blood Glucose: Lab Results  Component Value Date   GLUCAP 152 (H) 03/21/2020     Pulmonary Assessment Scores:  Pulmonary Assessment Scores     Row Name 07/09/20 1008 07/09/20 1136 07/09/20 1318     ADL UCSD   ADL Phase Entry Entry Entry   SOB Score total 72 -- 72     CAT Score   CAT Score 26 -- 26     mMRC Score   mMRC Score -- 3 --           UCSD: Self-administered rating of dyspnea associated with activities of daily living (ADLs) 6-point scale (0 = "not at all" to 5 = "maximal or unable to  do because of breathlessness")  Scoring Scores range from 0 to 120.  Minimally important difference is 5 units  CAT: CAT can identify the health impairment of COPD patients and is better correlated with disease progression.  CAT has a scoring range of zero to 40. The CAT score is classified into four groups of low (less than 10), medium (10 - 20), high (21-30) and very high (31-40) based on the impact level of disease on health status. A CAT score over 10 suggests significant symptoms.  A worsening CAT score could be explained by an exacerbation, poor medication adherence, poor inhaler technique, or progression of COPD or comorbid conditions.  CAT MCID is 2 points  mMRC: mMRC (Modified Medical Research Council) Dyspnea Scale is used to assess the degree of baseline functional disability in patients of respiratory disease due to dyspnea. No minimal important difference is established. A decrease in score of 1 point or greater is considered a positive change.   Pulmonary Function Assessment:  Pulmonary Function Assessment - 07/09/20 1322       Breath   Bilateral Breath Sounds Clear    Shortness of Breath Yes;Limiting activity             Exercise Target Goals: Exercise Program Goal: Individual exercise prescription set using results from initial 6 min walk test and THRR while considering  patient's activity barriers and safety.   Exercise Prescription Goal: Initial exercise prescription builds to 30-45 minutes a day of aerobic activity, 2-3 days per week.  Home exercise guidelines will be given to patient during program as part of exercise prescription that the participant will acknowledge.  Activity Barriers & Risk Stratification:  Activity Barriers & Cardiac Risk Stratification - 07/09/20 0940       Activity Barriers & Cardiac Risk Stratification   Activity Barriers Back Problems;Deconditioning;Muscular Weakness;Shortness of Breath             6 Minute Walk:  6 Minute  Walk     Row Name 07/09/20 1130         6 Minute Walk   Phase Initial     Distance 1177 feet     Walk Time 6 minutes     # of Rest Breaks 0     MPH 2.23     METS 3.37     RPE 11     Perceived Dyspnea  3     VO2 Peak 11.79     Symptoms No     Resting HR 91 bpm     Resting BP 92/60     Resting Oxygen Saturation  95 %     Exercise Oxygen Saturation  during 6 min walk 92 %     Max Ex. HR 157 bpm     Max Ex. BP 118/76     2 Minute Post BP 98/64           Interval HR     1 Minute HR 130     2 Minute HR 139     3 Minute HR 129     4 Minute HR 127     5 Minute HR 157     6 Minute HR 123     2 Minute Post HR 96     Interval Heart Rate? Yes           Interval Oxygen     Interval Oxygen? Yes     Baseline Oxygen Saturation % 95 %     1 Minute Oxygen Saturation % 93 %     1 Minute Liters of Oxygen 0 L     2 Minute Oxygen Saturation % 93 %     2 Minute Liters of Oxygen 0 L     3 Minute Oxygen Saturation % 93 %     3 Minute Liters of Oxygen 0 L     4 Minute Oxygen Saturation % 92 %     4 Minute Liters of Oxygen 0 L     5 Minute Oxygen Saturation % 92 %     5 Minute Liters of Oxygen 0 L     6 Minute Oxygen Saturation % 92 %     6 Minute Liters of Oxygen 0 L     2 Minute Post Oxygen Saturation % 97 %     2 Minute Post Liters of Oxygen 0 L             Oxygen Initial Assessment:  Oxygen Initial Assessment - 07/09/20 0944       Home Oxygen   Home Oxygen Device None    Sleep Oxygen Prescription None    Home Exercise Oxygen Prescription None    Home Resting Oxygen Prescription None      Initial 6 min Walk   Oxygen Used None      Program Oxygen Prescription   Program Oxygen Prescription None      Intervention   Short Term Goals To learn and understand importance of monitoring SPO2 with pulse oximeter and demonstrate accurate use of the pulse oximeter.;To learn and understand importance of maintaining oxygen saturations>88%;To learn and demonstrate proper pursed  lip breathing techniques or other breathing techniques. ;To learn and demonstrate proper use of respiratory medications    Long  Term Goals Verbalizes importance of monitoring SPO2 with pulse oximeter and return demonstration;Maintenance of O2 saturations>88%;Exhibits proper breathing techniques, such as pursed lip breathing or other method taught during program session;Compliance with respiratory medication;Demonstrates proper use of MDI's             Oxygen Re-Evaluation:  Oxygen Re-Evaluation     Row Name 07/30/20 1430             Program Oxygen Prescription   Program Oxygen Prescription None               Home Oxygen     Home Oxygen Device None       Sleep Oxygen Prescription None       Home Exercise Oxygen Prescription None       Home Resting Oxygen Prescription None  Goals/Expected Outcomes     Short Term Goals To learn and understand importance of monitoring SPO2 with pulse oximeter and demonstrate accurate use of the pulse oximeter.;To learn and understand importance of maintaining oxygen saturations>88%;To learn and demonstrate proper pursed lip breathing techniques or other breathing techniques. ;To learn and demonstrate proper use of respiratory medications       Long  Term Goals Verbalizes importance of monitoring SPO2 with pulse oximeter and return demonstration;Maintenance of O2 saturations>88%;Exhibits proper breathing techniques, such as pursed lip breathing or other method taught during program session;Compliance with respiratory medication;Demonstrates proper use of MDI's       Goals/Expected Outcomes compliance and understanding of monitoring oxygen saturation and importance of performing pursed lip breathing.               Oxygen Discharge (Final Oxygen Re-Evaluation):  Oxygen Re-Evaluation - 07/30/20 1430       Program Oxygen Prescription   Program Oxygen Prescription None      Home Oxygen   Home Oxygen Device None    Sleep Oxygen  Prescription None    Home Exercise Oxygen Prescription None    Home Resting Oxygen Prescription None      Goals/Expected Outcomes   Short Term Goals To learn and understand importance of monitoring SPO2 with pulse oximeter and demonstrate accurate use of the pulse oximeter.;To learn and understand importance of maintaining oxygen saturations>88%;To learn and demonstrate proper pursed lip breathing techniques or other breathing techniques. ;To learn and demonstrate proper use of respiratory medications    Long  Term Goals Verbalizes importance of monitoring SPO2 with pulse oximeter and return demonstration;Maintenance of O2 saturations>88%;Exhibits proper breathing techniques, such as pursed lip breathing or other method taught during program session;Compliance with respiratory medication;Demonstrates proper use of MDI's    Goals/Expected Outcomes compliance and understanding of monitoring oxygen saturation and importance of performing pursed lip breathing.             Initial Exercise Prescription:  Initial Exercise Prescription - 07/09/20 1100       Date of Initial Exercise RX and Referring Provider   Date 07/09/20    Referring Provider Dr. Shelle Iron ([place])    Expected Discharge Date 07/09/20      Treadmill   MPH 1.6    Grade 0    Minutes 15      NuStep   Level 2    SPM 80    Minutes 15      Prescription Details   Frequency (times per week) 2    Duration Progress to 30 minutes of continuous aerobic without signs/symptoms of physical distress      Intensity   THRR 40-80% of Max Heartrate 63-126    Ratings of Perceived Exertion 11-13    Perceived Dyspnea 0-4      Progression   Progression Continue to progress workloads to maintain intensity without signs/symptoms of physical distress.      Resistance Training   Training Prescription Yes    Weight Blue bands    Reps 10-15             Perform Capillary Blood Glucose checks as needed.  Exercise Prescription  Changes:   Exercise Prescription Changes     Row Name 07/19/20 1216             Response to Exercise   Blood Pressure (Admit) 108/58       Blood Pressure (Exercise) 122/74       Blood Pressure (Exit) 118/80  Heart Rate (Admit) 84 bpm       Heart Rate (Exercise) 118 bpm       Heart Rate (Exit) 104 bpm       Oxygen Saturation (Admit) 94 %       Oxygen Saturation (Exercise) 95 %       Oxygen Saturation (Exit) 95 %       Rating of Perceived Exertion (Exercise) 9       Perceived Dyspnea (Exercise) 0               Resistance Training     Training Prescription Yes       Weight blue bands       Reps 10-15       Time 10 Minutes               NuStep     Level 2       SPM 80       Minutes 30       METs 1.7               Exercise Comments:   Exercise Comments     Row Name 07/17/20 1156           Exercise Comments Patient completed first day of exercise and tolerated well with no complaints or concerns. He was able to do 15 minutes on the treadmill and 15 minutes on the Nustep with no rest breaks. He was also able to do all resistance band exercises with no mobility issues. Will continue to monitor.                Exercise Goals and Review:   Exercise Goals     Row Name 07/09/20 1129             Exercise Goals   Increase Physical Activity Yes       Intervention Provide advice, education, support and counseling about physical activity/exercise needs.;Develop an individualized exercise prescription for aerobic and resistive training based on initial evaluation findings, risk stratification, comorbidities and participant's personal goals.       Expected Outcomes Short Term: Attend rehab on a regular basis to increase amount of physical activity.;Long Term: Add in home exercise to make exercise part of routine and to increase amount of physical activity.;Long Term: Exercising regularly at least 3-5 days a week.       Increase Strength and Stamina Yes        Intervention Provide advice, education, support and counseling about physical activity/exercise needs.;Develop an individualized exercise prescription for aerobic and resistive training based on initial evaluation findings, risk stratification, comorbidities and participant's personal goals.       Expected Outcomes Short Term: Increase workloads from initial exercise prescription for resistance, speed, and METs.;Short Term: Perform resistance training exercises routinely during rehab and add in resistance training at home;Long Term: Improve cardiorespiratory fitness, muscular endurance and strength as measured by increased METs and functional capacity ( )       Able to understand and use rate of perceived exertion (RPE) scale Yes       Intervention Provide education and explanation on how to use RPE scale       Expected Outcomes Short Term: Able to use RPE daily in rehab to express subjective intensity level;Long Term:  Able to use RPE to guide intensity level when exercising independently       Able to understand and use Dyspnea scale Yes       Intervention  Provide education and explanation on how to use Dyspnea scale       Expected Outcomes Short Term: Able to use Dyspnea scale daily in rehab to express subjective sense of shortness of breath during exertion;Long Term: Able to use Dyspnea scale to guide intensity level when exercising independently       Knowledge and understanding of Target Heart Rate Range (THRR) Yes       Intervention Provide education and explanation of THRR including how the numbers were predicted and where they are located for reference       Expected Outcomes Short Term: Able to state/look up THRR;Long Term: Able to use THRR to govern intensity when exercising independently;Short Term: Able to use daily as guideline for intensity in rehab       Understanding of Exercise Prescription Yes       Intervention Provide education, explanation, and written materials on patient's  individual exercise prescription       Expected Outcomes Short Term: Able to explain program exercise prescription;Long Term: Able to explain home exercise prescription to exercise independently                Exercise Goals Re-Evaluation :  Exercise Goals Re-Evaluation     Row Name 07/30/20 1423             Exercise Goal Re-Evaluation   Exercise Goals Review Increase Physical Activity;Increase Strength and Stamina;Able to understand and use rate of perceived exertion (RPE) scale;Able to understand and use Dyspnea scale;Knowledge and understanding of Target Heart Rate Range (THRR);Understanding of Exercise Prescription       Comments Zella Ball has completed 3 exercise sessions and has tolerated very well so far. He is very motivated and is excited to be in the program. He has been able to do 15 minutes on the treadmill and 15 minutes on the Nustep with no rest breaks. He also has been tolerating the warm up/cool down and resistance exercises well with no limitations. He is exercising at 2.8 METS on the Nustep and 2.4 METS on the treadmill. Will continue to monitor and progress as he is able.       Expected Outcomes Through exercise at rehab and home, the patient will decrease shortness of breath with daily activities and feel confident in carrying out an exercise regimn at home.                Discharge Exercise Prescription (Final Exercise Prescription Changes):  Exercise Prescription Changes - 07/19/20 1216       Response to Exercise   Blood Pressure (Admit) 108/58    Blood Pressure (Exercise) 122/74    Blood Pressure (Exit) 118/80    Heart Rate (Admit) 84 bpm    Heart Rate (Exercise) 118 bpm    Heart Rate (Exit) 104 bpm    Oxygen Saturation (Admit) 94 %    Oxygen Saturation (Exercise) 95 %    Oxygen Saturation (Exit) 95 %    Rating of Perceived Exertion (Exercise) 9    Perceived Dyspnea (Exercise) 0      Resistance Training   Training Prescription Yes    Weight blue  bands    Reps 10-15    Time 10 Minutes      NuStep   Level 2    SPM 80    Minutes 30    METs 1.7             Nutrition:  Target Goals: Understanding of nutrition guidelines, daily intake of sodium <  , cholesterol 200mg , calories 30% from fat and 7% or less from saturated fats, daily to have 5 or more servings of fruits and vegetables.  Biometrics:  Pre Biometrics - 07/09/20 0941       Pre Biometrics   Height  (1.676 m)    Weight 84.2 kg    BMI (Calculated) 29.98    Grip Strength 32 kg              Nutrition Therapy Plan and Nutrition Goals:  Nutrition Therapy & Goals - 07/20/20 0748       Nutrition Therapy   Diet TLC; low sodium    Drug/Food Interactions Statins/Certain Fruits      Personal Nutrition Goals   Nutrition Goal Pt to identify food quantities necessary to achieve weight loss of 6-24 lb at graduation from pulmonary rehab.    Personal Goal #2 Pt to build a healthy plate including vegetables, fruits, whole grains, and low-fat dairy products in a heart healthy meal plan.    Personal Goal #3 Pt to read labels and monitor sodium intake to meet 1500 mg/day      Intervention Plan   Intervention Prescribe, educate and counsel regarding individualized specific dietary modifications aiming towards targeted core components such as weight, hypertension, lipid management, diabetes, heart failure and other comorbidities.;Nutrition handout(s) given to patient.    Expected Outcomes Long Term Goal: Adherence to prescribed nutrition plan.;Short Term Goal: A plan has been developed with personal nutrition goals set during dietitian appointment.             Nutrition Assessments:  MEDIFICTS Score Key: ?70 Need to make dietary changes  40-70 Heart Healthy Diet ? 40 Therapeutic Level Cholesterol Diet  Flowsheet Row PULMONARY REHAB CHRONIC OBSTRUCTIVE PULMONARY DISEASE from 07/19/2020 in [facility] CARDIAC REHAB  Picture Your Plate  Total Score on Admission 61      Picture Your Plate Scores: <16 Unhealthy dietary pattern with much room for improvement. 41-50 Dietary pattern unlikely to meet recommendations for good health and room for improvement. 51-60 More healthful dietary pattern, with some room for improvement.  >60 Healthy dietary pattern, although there may be some specific behaviors that could be improved.    Nutrition Goals Re-Evaluation:  Nutrition Goals Re-Evaluation     Row Name 07/20/20 0749 07/31/20 0642           Goals   Current Weight 178 lb (80.7 kg) 183 lb 13.8 oz (83.4 kg)      Nutrition Goal Pt to identify food quantities necessary to achieve weight loss of 6-24 lb at graduation from pulmonary rehab. Pt to identify food quantities necessary to achieve weight loss of 6-24 lb at graduation from pulmonary rehab.             Personal Goal #2 Re-Evaluation      Personal Goal #2 Pt to build a healthy plate including vegetables, fruits, whole grains, and low-fat dairy products in a heart healthy meal plan. Pt to build a healthy plate including vegetables, fruits, whole grains, and low-fat dairy products in a heart healthy meal plan.             Personal Goal #3 Re-Evaluation      Personal Goal #3 Pt to read labels and monitor sodium intake to meet 1500 mg/day Pt to read labels and monitor sodium intake to meet 1500 mg/day              Nutrition Goals Discharge (Final Nutrition Goals  Re-Evaluation):  Nutrition Goals Re-Evaluation - 07/31/20 1740       Goals   Current Weight 183 lb 13.8 oz (83.4 kg)    Nutrition Goal Pt to identify food quantities necessary to achieve weight loss of 6-24 lb at graduation from pulmonary rehab.      Personal Goal #2 Re-Evaluation   Personal Goal #2 Pt to build a healthy plate including vegetables, fruits, whole grains, and low-fat dairy products in a heart healthy meal plan.      Personal Goal #3 Re-Evaluation   Personal Goal #3 Pt to read labels and  monitor sodium intake to meet 1500 mg/day             Psychosocial: Target Goals: Acknowledge presence or absence of significant depression and/or stress, maximize coping skills, provide positive support system. Participant is able to verbalize types and ability to use techniques and skills needed for reducing stress and depression.  Initial Review & Psychosocial Screening:  Initial Psych Review & Screening - 07/09/20 1323       Initial Review   Current issues with --   Talks every 6 months to either a psychiatrist or pharmacologist at the [facility] and takes wellbutrin and it keeps his depression stable.            Quality of Life Scores:  Scores of 19 and below usually indicate a poorer quality of life in these areas.  A difference of  2-3 points is a clinically meaningful difference.  A difference of 2-3 points in the total score of the Quality of Life Index has been associated with significant improvement in overall quality of life, self-image, physical symptoms, and general health in studies assessing change in quality of life.  PHQ-9: Recent Review Flowsheet Data     Depression screen [facility] 2/9 07/09/2020 07/09/2020   Decreased Interest 0 -   Down, Depressed, Hopeless 0 0   PHQ - 2 Score 0 0   Altered sleeping 3  -   Tired, decreased energy 0 -   Change in appetite 0 -   Feeling bad or failure about yourself  0 -   Trouble concentrating 0 -   Moving slowly or fidgety/restless 0 -   Suicidal thoughts 0 -   PHQ-9 Score 3 -   Difficult doing work/chores Not difficult at all -      Interpretation of Total Score  Total Score Depression Severity:  1-4 = Minimal depression, 5-9 = Mild depression, 10-14 = Moderate depression, 15-19 = Moderately severe depression, 20-27 = Severe depression   Psychosocial Evaluation and Intervention:  Psychosocial Evaluation - 07/09/20 1325       Psychosocial Evaluation & Interventions   Interventions Encouraged to  exercise with the program and follow exercise prescription    Comments No concerns identified.    Continue Psychosocial Services  No Follow up required             Psychosocial Re-Evaluation:  Psychosocial Re-Evaluation     Row Name 07/30/20 1201             Psychosocial Re-Evaluation   Current issues with History of Depression;Current Depression;Current Sleep Concerns       Comments "Zella Ball" continues with his depression treatment through the [place] and it is stable at this time.  He has a positive attitude and seems to enjoy pulmonary rehab.       Expected Outcomes For "Zella Ball" to maintain his current level of mental health and continue his current  regime and treatment.       Interventions Encouraged to attend Pulmonary Rehabilitation for the exercise;Relaxation education;Stress management education       Continue Psychosocial Services  No Follow up required                Psychosocial Discharge (Final Psychosocial Re-Evaluation):  Psychosocial Re-Evaluation - 07/30/20 1201       Psychosocial Re-Evaluation   Current issues with History of Depression;Current Depression;Current Sleep Concerns    Comments "Zella BallRobin" continues with his [facility] and it is stable at this time.  He has a positive attitude and seems to enjoy pulmonary rehab.    Expected Outcomes For "Zella BallRobin" to maintain his current level of mental health and continue his current regime and treatment.    Interventions Encouraged to attend Pulmonary Rehabilitation for the exercise;Relaxation education;Stress management education    Continue Psychosocial Services  No Follow up required             Education: Education Goals: Education classes will be provided on a weekly basis, covering required topics. Participant will state understanding/return demonstration of topics presented.  Learning Barriers/Preferences:  Learning Barriers/Preferences - 07/09/20 0950       Learning  Barriers/Preferences   Learning Barriers None    Learning Preferences Computer/Internet;Audio;Pictoral;Video             Education Topics: Risk Factor Reduction:  -Group instruction that is supported by a PowerPoint presentation. Instructor discusses the definition of a risk factor, different risk factors for pulmonary disease, and how the heart and lungs work together.     Nutrition for Pulmonary Patient:  -Group instruction provided by PowerPoint slides, verbal discussion, and written materials to support subject matter. The instructor gives an explanation and review of healthy diet recommendations, which includes a discussion on weight management, recommendations for fruit and vegetable consumption, as well as protein, fluid, caffeine, fiber, sodium, sugar, and alcohol. Tips for eating when patients are short of breath are discussed.   Pursed Lip Breathing:  -Group instruction that is supported by demonstration and informational handouts. Instructor discusses the benefits of pursed lip and diaphragmatic breathing and detailed demonstration on how to preform both.     Oxygen Safety:  -Group instruction provided by PowerPoint, verbal discussion, and written material to support subject matter. There is an overview of "What is Oxygen" and "Why do we need it".  Instructor also reviews how to create a safe environment for oxygen use, the importance of using oxygen as prescribed, and the risks of noncompliance. There is a brief discussion on traveling with oxygen and resources the patient may utilize.   Oxygen Equipment:  -Group instruction provided by [facility] Staff utilizing handouts, written materials, and equipment demonstrations.   Signs and Symptoms:  -Group instruction provided by written material and verbal discussion to support subject matter. Warning signs and symptoms of infection, stroke, and heart attack are reviewed and when to call the physician/911 reinforced. Tips for  preventing the spread of infection discussed.   Advanced Directives:  -Group instruction provided by verbal instruction and written material to support subject matter. Instructor reviews Advanced Directive laws and proper instruction for filling out document.   Pulmonary Video:  -Group video education that reviews the importance of medication and oxygen compliance, exercise, good nutrition, pulmonary hygiene, and pursed lip and diaphragmatic breathing for the pulmonary patient.   Exercise for the Pulmonary Patient:  -Group instruction that is supported by a PowerPoint presentation. Instructor discusses benefits  of exercise, core components of exercise, frequency, duration, and intensity of an exercise routine, importance of utilizing pulse oximetry during exercise, safety while exercising, and options of places to exercise outside of rehab.   Flowsheet Row PULMONARY REHAB CHRONIC OBSTRUCTIVE PULMONARY DISEASE from 07/26/2020 in [facility] CARDIAC REHAB  Date 07/26/20  Educator Handout       Pulmonary Medications:  -Verbally interactive group education provided by instructor with focus on inhaled medications and proper administration.   Anatomy and Physiology of the Respiratory System and Intimacy:  -Group instruction provided by PowerPoint, verbal discussion, and written material to support subject matter. Instructor reviews respiratory cycle and anatomical components of the respiratory system and their functions. Instructor also reviews differences in obstructive and restrictive respiratory diseases with examples of each. Intimacy, Sex, and Sexuality differences are reviewed with a discussion on how relationships can change when diagnosed with pulmonary disease. Common sexual concerns are reviewed.   MD DAY -A group question and answer session with a medical doctor that allows participants to ask questions that relate to their pulmonary disease state.   OTHER  EDUCATION -Group or individual verbal, written, or video instructions that support the educational goals of the pulmonary rehab program. Flowsheet Row PULMONARY REHAB CHRONIC OBSTRUCTIVE PULMONARY DISEASE from 07/26/2020 in [facility] CARDIAC REHAB  Date 07/19/20  Educator Handout  [Know your numbers]       Holiday Eating Survival Tips:  -Group instruction provided by PowerPoint slides, verbal discussion, and written materials to support subject matter. The instructor gives patients tips, tricks, and techniques to help them not only survive but enjoy the holidays despite the onslaught of food that accompanies the holidays.   Knowledge Questionnaire Score:  Knowledge Questionnaire Score - 07/09/20 1325       Knowledge Questionnaire Score   Pre Score 9/18             Core Components/Risk Factors/Patient Goals at Admission:  Personal Goals and Risk Factors at Admission - 07/09/20 1326       Core Components/Risk Factors/Patient Goals on Admission   Improve shortness of breath with ADL's Yes    Intervention Provide education, individualized exercise plan and daily activity instruction to help decrease symptoms of SOB with activities of daily living.    Expected Outcomes Short Term: Improve cardiorespiratory fitness to achieve a reduction of symptoms when performing ADLs;Long Term: Be able to perform more ADLs without symptoms or delay the onset of symptoms             Core Components/Risk Factors/Patient Goals Review:   Goals and Risk Factor Review     Row Name 07/09/20 1326 07/30/20 1204           Core Components/Risk Factors/Patient Goals Review   Personal Goals Review Develop more efficient breathing techniques such as purse lipped breathing and diaphragmatic breathing and practicing self-pacing with activity.;Increase knowledge of respiratory medications and ability to use respiratory devices properly.;Improve shortness of breath with ADL's Develop  more efficient breathing techniques such as purse lipped breathing and diaphragmatic breathing and practicing self-pacing with activity.;Increase knowledge of respiratory medications and ability to use respiratory devices properly.;Improve shortness of breath with ADL's      Review -- "Zella Ball" is learning how to exercise safely and appropriately for someone with COPD.  He has attended 3 exercise sessions and it is too early to see progression towards program goals.      Expected Outcomes -- See admission goals.  Core Components/Risk Factors/Patient Goals at Discharge (Final Review):   Goals and Risk Factor Review - 07/30/20 1204       Core Components/Risk Factors/Patient Goals Review   Personal Goals Review Develop more efficient breathing techniques such as purse lipped breathing and diaphragmatic breathing and practicing self-pacing with activity.;Increase knowledge of respiratory medications and ability to use respiratory devices properly.;Improve shortness of breath with ADL's    Review "Zella Ball" is learning how to exercise safely and appropriately for someone with COPD.  He has attended 3 exercise sessions and it is too early to see progression towards program goals.    Expected Outcomes See admission goals.             ITP Comments:   Comments:  Zella Ball has completed 3 exercise session in Pulmonary rehab. Pt maintains good attendance. Pulmonary rehab staff will continue to monitor and reassess progress toward goals during his participation in Pulmonary Rehab.

## 2020-08-02 ENCOUNTER — Encounter (HOSPITAL_COMMUNITY)
Admission: RE | Admit: 2020-08-02 | Discharge: 2020-08-02 | Disposition: A | Discharge status: Home / Self Care | Payer: No Typology Code available for payment source | Source: Ambulatory Visit | Attending: Cardiology | Admitting: Cardiology

## 2020-08-02 ENCOUNTER — Other Ambulatory Visit: Payer: Self-pay

## 2020-08-02 VITALS — Wt 186.9 lb

## 2020-08-02 DIAGNOSIS — J449 Chronic obstructive pulmonary disease, unspecified: Secondary | ICD-10-CM | POA: Diagnosis not present

## 2020-08-02 NOTE — Progress Notes (Signed)
Daily Session Note  Patient Details  Name: Tanner Chang MRN: [id number] Date of Birth: Feb 23, 1955 Referring Provider:   April Manson Pulmonary Rehab Walk Test from 07/09/2020 in [place]  Referring Provider Dr. Gwenette Greet ([place])       Encounter Date: 08/02/2020  Check In:  Session Check In - 08/02/20 1156       Check-In   Supervising physician immediately available to respond to emergencies [facility] immediately available    Physician(s) Dr. Adalberto Ill    Location [facility]    Staff Present Rosebud Poles, RN, Isaac Laud, MS, ACSM-CEP, Exercise Physiologist;Lisa Clarisa Schools, MS, ACSM-CEP, Exercise Physiologist    Virtual Visit No    Medication changes reported     No    Fall or balance concerns reported    No    Tobacco Cessation No Change    Warm-up and Cool-down Performed as group-led instruction    Resistance Training Performed Yes    VAD Patient? No    PAD/SET Patient? No      Pain Assessment   Currently in Pain? No/denies    Pain Score 0-No pain    Multiple Pain Sites No             Capillary Blood Glucose: No results found for this or any previous visit (from the past 24 hour(s)).    Social History   Tobacco Use  Smoking Status Former   Years: 20.00   Types: Cigarettes  Smokeless Tobacco Former    Goals Met:  Exercise tolerated well No report of cardiac concerns or symptoms Strength training completed today  Goals Unmet:  Not Applicable  Comments: Service time is from 1013 to 1130    Dr. Fransico Him is Medical Director for Cardiac Rehab at [facility].

## 2020-08-07 ENCOUNTER — Encounter (HOSPITAL_COMMUNITY): Payer: No Typology Code available for payment source

## 2020-08-07 ENCOUNTER — Telehealth (HOSPITAL_COMMUNITY): Payer: Self-pay | Admitting: *Deleted

## 2020-08-07 NOTE — Telephone Encounter (Signed)
Received message on voicemail that he will be out today for pulmonary rehab due to appt at the [place].  Plans to return on Thursday. Alanson Aly, BSN Cardiac and Emergency planning/management officer

## 2020-08-09 ENCOUNTER — Telehealth (HOSPITAL_COMMUNITY): Payer: Self-pay

## 2020-08-09 ENCOUNTER — Encounter (HOSPITAL_COMMUNITY): Payer: No Typology Code available for payment source

## 2020-08-09 NOTE — Telephone Encounter (Signed)
Pt called and stated that he was exposed to COVID-19, pt is vaccinated and has no symptoms I advised pt to get tested and if he is negative he can come back to pulmonary rehab.

## 2020-08-09 NOTE — Telephone Encounter (Signed)
Pt called back and stated that his COVID test came back negative and that he will be back for pulmonary rehab on Tuesday 08/14/2020.

## 2020-08-14 ENCOUNTER — Other Ambulatory Visit: Payer: Self-pay

## 2020-08-14 ENCOUNTER — Encounter (HOSPITAL_COMMUNITY)
Admission: RE | Admit: 2020-08-14 | Discharge: 2020-08-14 | Disposition: A | Discharge status: Home / Self Care | Payer: No Typology Code available for payment source | Source: Ambulatory Visit | Attending: Cardiology | Admitting: Cardiology

## 2020-08-14 DIAGNOSIS — Z87891 Personal history of nicotine dependence: Secondary | ICD-10-CM | POA: Diagnosis not present

## 2020-08-14 DIAGNOSIS — J449 Chronic obstructive pulmonary disease, unspecified: Secondary | ICD-10-CM | POA: Diagnosis not present

## 2020-08-14 NOTE — Progress Notes (Signed)
Daily Session Note  Patient Details  Name: Tanner Chang MRN: [id number] Date of Birth: 05-30-1955 Referring Provider:   April Manson Pulmonary Rehab Walk Test from 07/09/2020 in [place]  Referring Provider Dr. Gwenette Greet ([place])       Encounter Date: 08/14/2020  Check In:  Session Check In - 08/14/20 1107       Check-In   Supervising physician immediately available to respond to emergencies [facility] immediately available    Physician(s) Dr. Broadus John    Location [facility]    Staff Present Rodney Langton, RN;Joan Leonia Reeves, RN, Quentin Ore, MS, ACSM-CEP, Exercise Physiologist    Virtual Visit No    Medication changes reported     No    Fall or balance concerns reported    No    Tobacco Cessation No Change    Warm-up and Cool-down Performed as group-led instruction    Resistance Training Performed Yes    VAD Patient? No    PAD/SET Patient? No      Pain Assessment   Currently in Pain? No/denies    Multiple Pain Sites No             Capillary Blood Glucose: No results found for this or any previous visit (from the past 24 hour(s)).    Social History   Tobacco Use  Smoking Status Former   Years: 20.00   Types: Cigarettes  Smokeless Tobacco Former    Goals Met:  Independence with exercise equipment Exercise tolerated well No report of cardiac concerns or symptoms Strength training completed today  Goals Unmet:  Not Applicable  Comments: Service time is from 1015 to 1130    Dr. Fransico Him is Market researcher for Cardiac Rehab at [facility].

## 2020-08-16 ENCOUNTER — Encounter (HOSPITAL_COMMUNITY)
Admission: RE | Admit: 2020-08-16 | Discharge: 2020-08-16 | Disposition: A | Discharge status: Home / Self Care | Payer: No Typology Code available for payment source | Source: Ambulatory Visit | Attending: Cardiology | Admitting: Cardiology

## 2020-08-16 ENCOUNTER — Other Ambulatory Visit: Payer: Self-pay

## 2020-08-16 DIAGNOSIS — J449 Chronic obstructive pulmonary disease, unspecified: Secondary | ICD-10-CM | POA: Diagnosis not present

## 2020-08-16 NOTE — Progress Notes (Signed)
Daily Session Note  Patient Details  Name: Tanner Chang MRN: [id number] Date of Birth: 07/23/1955 Referring Provider:   April Manson Pulmonary Rehab Walk Test from 07/09/2020 in [place]  Referring Provider Dr. Gwenette Greet ([place])       Encounter Date: 08/16/2020  Check In:  Session Check In - 08/16/20 1140       Check-In   Supervising physician immediately available to respond to emergencies [facility] immediately available    Physician(s) Dr. Broadus John    [facility]    Staff Present Rosebud Poles, RN, Milus Glazier, MS, ACSM-CEP, CCRP, Exercise Physiologist;Siddh Vandeventer Ysidro Evert, RN    Virtual Visit No    Medication changes reported     No    Fall or balance concerns reported    No    Tobacco Cessation No Change    Warm-up and Cool-down Performed as group-led instruction    Resistance Training Performed Yes    VAD Patient? No    PAD/SET Patient? No      Pain Assessment   Currently in Pain? No/denies    Pain Score 0-No pain    Multiple Pain Sites No             Capillary Blood Glucose: No results found for this or any previous visit (from the past 24 hour(s)).    Social History   Tobacco Use  Smoking Status Former   Years: 20.00   Types: Cigarettes  Smokeless Tobacco Former    Goals Met:  Exercise tolerated well No report of cardiac concerns or symptoms Strength training completed today  Goals Unmet:  Not Applicable  Comments: Service time is from 1020 to 1125    Dr. Fransico Him is Market researcher for Cardiac Rehab at [facility].

## 2020-08-16 NOTE — Progress Notes (Addendum)
Nutrition Note Spoke with pt. He has gained about 3 lbs since starting rehab. He is frustrated with weight gain. He tried to increase calorie intake to 1500-1800 based on estimated needs. He has reduced calorie intake back to 1200/day.  We discussed ways to incorporate adequate calories. He thinks he could do better with exercise.  Discussed getting adequate protein. Provided recommendations.   Nutrition Diagnosis  Food-and nutrition-related knowledge deficit related to lack of exposure to information as related to diagnosis of: ? COPD, CHF   Nutrition Intervention  Pt's individual nutrition plan reviewed with pt.  1500 mg sodium, TLC                 Continue client-centered nutrition education by RD, as part of interdisciplinary care.   Goal(s) Pt to identify food quantities necessary to achieve weight loss of 6-24 lb at graduation from pulmonary rehab. Pt to build a healthy plate including vegetables, fruits, whole grains, and low-fat dairy products in a heart healthy meal plan. Pt to read labels and monitor sodium intake to meet 1500 mg/day    Plan:  Will provide client-centered nutrition education as part of interdisciplinary care Monitor and evaluate progress toward nutrition goal with team.    Andrey Campanile, MS, RDN, LDN, CDCES

## 2020-08-21 ENCOUNTER — Encounter (HOSPITAL_COMMUNITY)
Admission: RE | Admit: 2020-08-21 | Discharge: 2020-08-21 | Disposition: A | Discharge status: Home / Self Care | Payer: No Typology Code available for payment source | Source: Ambulatory Visit | Attending: Cardiology | Admitting: Cardiology

## 2020-08-21 ENCOUNTER — Other Ambulatory Visit: Payer: Self-pay

## 2020-08-21 DIAGNOSIS — J449 Chronic obstructive pulmonary disease, unspecified: Secondary | ICD-10-CM | POA: Diagnosis not present

## 2020-08-21 NOTE — Progress Notes (Signed)
Daily Session Note  Patient Details  Name: Tanner Chang MRN: [id number] Date of Birth: 1955-09-08 Referring Provider:   April Manson Pulmonary Rehab Walk Test from 07/09/2020 in [place]  Referring Provider Dr. Gwenette Greet ([place])       Encounter Date: 08/21/2020  Check In:  Session Check In - 08/21/20 1112       Check-In   Supervising physician immediately available to respond to emergencies [facility] immediately available    Physician(s) Dr. Broadus John    [facility]    Staff Present Rodney Langton, RN;Tylia Ewell Leonia Reeves, RN, Isaac Laud, MS, ACSM-CEP, Exercise Physiologist    Virtual Visit Yes    Medication changes reported     No    Fall or balance concerns reported    No    Tobacco Cessation No Change    Warm-up and Cool-down Performed as group-led instruction    Resistance Training Performed Yes    VAD Patient? No    PAD/SET Patient? No      Pain Assessment   Currently in Pain? No/denies    Multiple Pain Sites No             Capillary Blood Glucose: No results found for this or any previous visit (from the past 24 hour(s)).   Exercise Prescription Changes - 08/21/20 1100       Response to Exercise   Blood Pressure (Admit) 118/70    Blood Pressure (Exercise) 140/80    Blood Pressure (Exit) 120/80    Heart Rate (Admit) 70 bpm    Heart Rate (Exercise) 117 bpm    Heart Rate (Exit) 88 bpm    Oxygen Saturation (Admit) 94 %    Oxygen Saturation (Exercise) 92 %    Oxygen Saturation (Exit) 94 %    Rating of Perceived Exertion (Exercise) 11    Perceived Dyspnea (Exercise) 1    Duration Continue with 30 min of aerobic exercise without signs/symptoms of physical distress.    Intensity THRR unchanged      Resistance Training   Training Prescription Yes    Weight blue bands    Reps 10-15    Time 10 Minutes      Treadmill   MPH 2.7    Grade 0    Minutes 15      NuStep   Level 4    SPM 80     Minutes 15    METs 3.4             Social History   Tobacco Use  Smoking Status Former   Years: 20.00   Types: Cigarettes  Smokeless Tobacco Former    Goals Met:  Proper associated with RPD/PD & O2 Sat Exercise tolerated well No report of cardiac concerns or symptoms Strength training completed today  Goals Unmet:  Not Applicable  Comments: Service time is from 1020 to 1135.    Dr. Fransico Him is Medical Director for Cardiac Rehab at [facility].

## 2020-08-23 ENCOUNTER — Other Ambulatory Visit: Payer: Self-pay

## 2020-08-23 ENCOUNTER — Encounter (HOSPITAL_COMMUNITY)
Admission: RE | Admit: 2020-08-23 | Discharge: 2020-08-23 | Disposition: A | Discharge status: Home / Self Care | Payer: No Typology Code available for payment source | Source: Ambulatory Visit | Attending: Cardiology | Admitting: Cardiology

## 2020-08-23 DIAGNOSIS — J449 Chronic obstructive pulmonary disease, unspecified: Secondary | ICD-10-CM

## 2020-08-23 NOTE — Progress Notes (Signed)
Daily Session Note  Patient Details  Name: CHANCE MUNTER MRN: [id number] Date of Birth: [birth year] Referring Provider:   April Manson Pulmonary Rehab Walk Test from 07/09/2020 in [place]  Referring Provider Dr. Gwenette Greet ([place])       Encounter Date: 08/23/2020  Check In:  Session Check In - 08/23/20 1138       Check-In   Supervising physician immediately available to respond to emergencies [facility] immediately available    Physician(s) Dr. Alfredia Ferguson    [facility]    Staff Present [place] Poles, RN, BSN;Ocie Tino Ysidro Evert, Cathleen Fears, MS, ACSM-CEP, Exercise Physiologist    Virtual Visit No    Medication changes reported     No    Fall or balance concerns reported    No    Warm-up and Cool-down Performed as group-led instruction    Resistance Training Performed Yes    VAD Patient? No    PAD/SET Patient? No      Pain Assessment   Currently in Pain? No/denies    Multiple Pain Sites No             Capillary Blood Glucose: No results found for this or any previous visit (from the past 24 hour(s)).    Social History   Tobacco Use  Smoking Status Former   Years: 20.00   Types: Cigarettes  Smokeless Tobacco Former    Goals Met:  Exercise tolerated well No report of cardiac concerns or symptoms Strength training completed today  Goals Unmet:  Not Applicable.  Comments: Service time is from 1014 to 1125    Dr. Fransico Him is Medical Director for Cardiac Rehab at [facility].

## 2020-08-28 ENCOUNTER — Encounter (HOSPITAL_COMMUNITY)
Admission: RE | Admit: 2020-08-28 | Discharge: 2020-08-28 | Disposition: A | Discharge status: Home / Self Care | Payer: No Typology Code available for payment source | Source: Ambulatory Visit | Attending: Cardiology | Admitting: Cardiology

## 2020-08-28 ENCOUNTER — Other Ambulatory Visit: Payer: Self-pay

## 2020-08-28 DIAGNOSIS — J449 Chronic obstructive pulmonary disease, unspecified: Secondary | ICD-10-CM | POA: Diagnosis not present

## 2020-08-28 NOTE — Progress Notes (Signed)
Daily Session Note  Patient Details  Name: Tanner Chang MRN: [id number] Date of Birth: 22-Mar-1955 Referring Provider:   April Manson Pulmonary Rehab Walk Test from 07/09/2020 in [place]  Referring Provider Dr. Gwenette Greet ([place])       Encounter Date: 08/28/2020  Check In:  Session Check In - 08/28/20 1139       Check-In   Supervising physician immediately available to respond to emergencies [facility] immediately available    Physician(s) Dr. Alfredia Ferguson    Location [facility]    Staff Present [place]bud Poles, RN, BSN;Czar Ysaguirre Ysidro Evert, Cathleen Fears, MS, ACSM-CEP, Exercise Physiologist    Virtual Visit No    Medication changes reported     No    Fall or balance concerns reported    No    Tobacco Cessation No Change    Warm-up and Cool-down Performed as group-led instruction    Resistance Training Performed Yes    VAD Patient? No    PAD/SET Patient? No      Pain Assessment   Currently in Pain? No/denies    Multiple Pain Sites No             Capillary Blood Glucose: No results found for this or any previous visit (from the past 24 hour(s)).    Social History   Tobacco Use  Smoking Status Former   Years: 20.00   Types: Cigarettes  Smokeless Tobacco Former    Goals Met:  Exercise tolerated well No report of cardiac concerns or symptoms Strength training completed today  Goals Unmet:  Not Applicable  Comments: Service time is from 1017 to 1130    Dr. Fransico Him is Market researcher for Cardiac Rehab at [facility]

## 2020-08-28 NOTE — Progress Notes (Signed)
Pulmonary Individual Treatment Plan  Patient Details  Name: Tanner Chang MRN: [id number] Date of Birth: Feb 19, 1955 Referring Provider:   April Manson Pulmonary Rehab Walk Test from 07/09/2020 in [place]  Referring Provider Dr. Gwenette Greet ([place])       Initial Encounter Date:  Flowsheet Row Pulmonary Rehab Walk Test from 07/09/2020 in [place]  Date 07/09/20       Visit Diagnosis: Chronic obstructive pulmonary disease, unspecified COPD type ([place])  Patient's Home Medications on Admission:   Current Outpatient Medications:    acetaminophen (TYLENOL) 500 MG tablet, Take 1,000 mg by mouth every 6 (six) hours as needed for mild pain or headache., Disp: , Rfl:    albuterol (PROVENTIL) (2.5 MG/3ML) 0.083% nebulizer solution, Take 3 mLs (2.5 mg total) by nebulization every 6 (six) hours as needed for wheezing or shortness of breath., Disp: 75 mL, Rfl: 12   albuterol (VENTOLIN HFA) 108 (90 Base) MCG/ACT inhaler, Inhale 2 puffs into the lungs every 4 (four) hours as needed for wheezing or shortness of breath., Disp: 6.7 g, Rfl: 0   Aspirin-Salicylamide-Caffeine (BC HEADACHE POWDER PO), Take 2 packets by mouth daily as needed (headaches). (Patient not taking: Reported on 07/09/2020), Disp: , Rfl:    atorvastatin (LIPITOR) 10 MG tablet, Take 10 mg by mouth at bedtime., Disp: , Rfl:    budesonide-formoterol (SYMBICORT) 160-4.5 MCG/ACT inhaler, Inhale 2 puffs into the lungs 2 (two) times daily., Disp: , Rfl:    buPROPion (WELLBUTRIN XL) 300 MG 24 hr tablet, Take 300 mg by mouth daily with breakfast., Disp: , Rfl:    FLUoxetine (PROZAC) 20 MG capsule, Take 20 mg by mouth in the morning., Disp: , Rfl:    furosemide (LASIX) 40 MG tablet, Take 1 tablet (40 mg total) by mouth daily., Disp: 30 tablet, Rfl: 2   Ipratropium-Albuterol (COMBIVENT RESPIMAT) 20-100 MCG/ACT AERS respimat, Inhale 2 puffs into the lungs daily., Disp: , Rfl:     losartan (COZAAR) 100 MG tablet, Take 100 mg by mouth in the morning., Disp: , Rfl:    Multiple Vitamins-Minerals (ONE-A-DAY MENS 50+ ADVANTAGE) TABS, Take 1 tablet by mouth daily with breakfast., Disp: , Rfl:    omeprazole (PRILOSEC) 20 MG capsule, Take 20 mg by mouth daily before breakfast., Disp: , Rfl:    OVER THE COUNTER MEDICATION, Take 1 tablet by mouth daily. Vitamin d, vitamin c, zinc combo vitamin, Disp: , Rfl:    Oxcarbazepine (TRILEPTAL) 300 MG tablet, Take 300 mg by mouth 2 (two) times daily., Disp: , Rfl:    potassium chloride SA (KLOR-CON) 20 MEQ tablet, Take 1 tablet (20 mEq total) by mouth daily., Disp: 3 tablet, Rfl: 0   Psyllium (METAMUCIL PO), Take by mouth See admin instructions. Mix 1 tablespoonful of powder into 8 ounces of water and drink once a day as needed for constipation, Disp: , Rfl:   Past Medical History: Past Medical History:  Diagnosis Date   CHF (congestive heart failure) (HCC)    COPD (chronic obstructive pulmonary disease) (HCC)    Hypertension    Long COVID     Tobacco Use: Social History   Tobacco Use  Smoking Status Former   Years: 20.00   Types: Cigarettes  Smokeless Tobacco Former    Labs: Recent Government social research officer for [facility] and Pulmonary Rehab Latest Ref Rng & Units 02/18/2020   PHART 7.350 - 7.450 7.353   PCO2ART 32.0 -  48.0 mmHg 55.1(H)   HCO3 20.0 - 28.0 mmol/L 29.8(H)   O2SAT % 96.0       Capillary Blood Glucose: Lab Results  Component Value Date   GLUCAP 152 (H) 03/21/2020     Pulmonary Assessment Scores:  Pulmonary Assessment Scores     Row Name 07/09/20 1008 07/09/20 1136 07/09/20 1318     ADL UCSD   ADL Phase Entry Entry Entry   SOB Score total 72 -- 72     CAT Score   CAT Score 26 -- 26     mMRC Score   mMRC Score -- 3 --           UCSD: Self-administered rating of dyspnea associated with activities of daily living (ADLs) 6-point scale (0 = "not at all" to 5 = "maximal or unable to  do because of breathlessness")  Scoring Scores range from 0 to 120.  Minimally important difference is 5 units  CAT: CAT can identify the health impairment of COPD patients and is better correlated with disease progression.  CAT has a scoring range of zero to 40. The CAT score is classified into four groups of low (less than 10), medium (10 - 20), high (21-30) and very high (31-40) based on the impact level of disease on health status. A CAT score over 10 suggests significant symptoms.  A worsening CAT score could be explained by an exacerbation, poor medication adherence, poor inhaler technique, or progression of COPD or comorbid conditions.  CAT MCID is 2 points  mMRC: mMRC (Modified Medical Research Council) Dyspnea Scale is used to assess the degree of baseline functional disability in patients of respiratory disease due to dyspnea. No minimal important difference is established. A decrease in score of 1 point or greater is considered a positive change.   Pulmonary Function Assessment:  Pulmonary Function Assessment - 07/09/20 1322       Breath   Bilateral Breath Sounds Clear    Shortness of Breath Yes;Limiting activity             Exercise Target Goals: Exercise Program Goal: Individual exercise prescription set using results from initial 6 min walk test and THRR while considering  patient's activity barriers and safety.   Exercise Prescription Goal: Initial exercise prescription builds to 30-45 minutes a day of aerobic activity, 2-3 days per week.  Home exercise guidelines will be given to patient during program as part of exercise prescription that the participant will acknowledge.  Activity Barriers & Risk Stratification:  Activity Barriers & Cardiac Risk Stratification - 07/09/20 0940       Activity Barriers & Cardiac Risk Stratification   Activity Barriers Back Problems;Deconditioning;Muscular Weakness;Shortness of Breath             6 Minute Walk:  6 Minute  Walk     Row Name 07/09/20 1130         6 Minute Walk   Phase Initial     Distance 1177 feet     Walk Time 6 minutes     # of Rest Breaks 0     MPH 2.23     METS 3.37     RPE 11     Perceived Dyspnea  3     VO2 Peak 11.79     Symptoms No     Resting HR 91 bpm     Resting BP 92/60     Resting Oxygen Saturation  95 %     Exercise Oxygen Saturation  during 6 min walk 92 %     Max Ex. HR 157 bpm     Max Ex. BP 118/76     2 Minute Post BP 98/64           Interval HR   1 Minute HR 130     2 Minute HR 139     3 Minute HR 129     4 Minute HR 127     5 Minute HR 157     6 Minute HR 123     2 Minute Post HR 96     Interval Heart Rate? Yes           Interval Oxygen   Interval Oxygen? Yes     Baseline Oxygen Saturation % 95 %     1 Minute Oxygen Saturation % 93 %     1 Minute Liters of Oxygen 0 L     2 Minute Oxygen Saturation % 93 %     2 Minute Liters of Oxygen 0 L     3 Minute Oxygen Saturation % 93 %     3 Minute Liters of Oxygen 0 L     4 Minute Oxygen Saturation % 92 %     4 Minute Liters of Oxygen 0 L     5 Minute Oxygen Saturation % 92 %     5 Minute Liters of Oxygen 0 L     6 Minute Oxygen Saturation % 92 %     6 Minute Liters of Oxygen 0 L     2 Minute Post Oxygen Saturation % 97 %     2 Minute Post Liters of Oxygen 0 L              Oxygen Initial Assessment:  Oxygen Initial Assessment - 07/09/20 0944       Home Oxygen   Home Oxygen Device None    Sleep Oxygen Prescription None    Home Exercise Oxygen Prescription None    Home Resting Oxygen Prescription None      Initial 6 min Walk   Oxygen Used None      Program Oxygen Prescription   Program Oxygen Prescription None      Intervention   Short Term Goals To learn and understand importance of monitoring SPO2 with pulse oximeter and demonstrate accurate use of the pulse oximeter.;To learn and understand importance of maintaining oxygen saturations>88%;To learn and demonstrate proper pursed lip  breathing techniques or other breathing techniques. ;To learn and demonstrate proper use of respiratory medications    Long  Term Goals Verbalizes importance of monitoring SPO2 with pulse oximeter and return demonstration;Maintenance of O2 saturations>88%;Exhibits proper breathing techniques, such as pursed lip breathing or other method taught during program session;Compliance with respiratory medication;Demonstrates proper use of MDI's             Oxygen Re-Evaluation:  Oxygen Re-Evaluation     Row Name 07/30/20 1430             Program Oxygen Prescription   Program Oxygen Prescription None               Home Oxygen   Home Oxygen Device None       Sleep Oxygen Prescription None       Home Exercise Oxygen Prescription None       Home Resting Oxygen Prescription None               Goals/Expected Outcomes   Short  Term Goals To learn and understand importance of monitoring SPO2 with pulse oximeter and demonstrate accurate use of the pulse oximeter.;To learn and understand importance of maintaining oxygen saturations>88%;To learn and demonstrate proper pursed lip breathing techniques or other breathing techniques. ;To learn and demonstrate proper use of respiratory medications       Long  Term Goals Verbalizes importance of monitoring SPO2 with pulse oximeter and return demonstration;Maintenance of O2 saturations>88%;Exhibits proper breathing techniques, such as pursed lip breathing or other method taught during program session;Compliance with respiratory medication;Demonstrates proper use of MDI's       Goals/Expected Outcomes compliance and understanding of monitoring oxygen saturation and importance of performing pursed lip breathing.                Oxygen Discharge (Final Oxygen Re-Evaluation):  Oxygen Re-Evaluation - 07/30/20 1430       Program Oxygen Prescription   Program Oxygen Prescription None      Home Oxygen   Home Oxygen Device None    Sleep Oxygen  Prescription None    Home Exercise Oxygen Prescription None    Home Resting Oxygen Prescription None      Goals/Expected Outcomes   Short Term Goals To learn and understand importance of monitoring SPO2 with pulse oximeter and demonstrate accurate use of the pulse oximeter.;To learn and understand importance of maintaining oxygen saturations>88%;To learn and demonstrate proper pursed lip breathing techniques or other breathing techniques. ;To learn and demonstrate proper use of respiratory medications    Long  Term Goals Verbalizes importance of monitoring SPO2 with pulse oximeter and return demonstration;Maintenance of O2 saturations>88%;Exhibits proper breathing techniques, such as pursed lip breathing or other method taught during program session;Compliance with respiratory medication;Demonstrates proper use of MDI's    Goals/Expected Outcomes compliance and understanding of monitoring oxygen saturation and importance of performing pursed lip breathing.             Initial Exercise Prescription:  Initial Exercise Prescription - 07/09/20 1100       Date of Initial Exercise RX and Referring Provider   Date 07/09/20    Referring Provider Dr. Gwenette Greet ([place])    Expected Discharge Date 07/09/20      Treadmill   MPH 1.6    Grade 0    Minutes 15      NuStep   Level 2    SPM 80    Minutes 15      Prescription Details   Frequency (times per week) 2    Duration Progress to 30 minutes of continuous aerobic without signs/symptoms of physical distress      Intensity   THRR 40-80% of Max Heartrate 63-126    Ratings of Perceived Exertion 11-13    Perceived Dyspnea 0-4      Progression   Progression Continue to progress workloads to maintain intensity without signs/symptoms of physical distress.      Resistance Training   Training Prescription Yes    Weight Blue bands    Reps 10-15             Perform Capillary Blood Glucose checks as needed.  Exercise Prescription  Changes:   Exercise Prescription Changes     Row Name 07/19/20 1216 08/07/20 1100 08/21/20 1100         Response to Exercise   Blood Pressure (Admit) 108/58 110/80 118/70     Blood Pressure (Exercise) 122/74 -- 140/80     Blood Pressure (Exit) 118/80 100/68 120/80     Heart  Rate (Admit) 84 bpm 108 bpm 70 bpm     Heart Rate (Exercise) 118 bpm 140 bpm 117 bpm     Heart Rate (Exit) 104 bpm 112 bpm 88 bpm     Oxygen Saturation (Admit) 94 % 93 % 94 %     Oxygen Saturation (Exercise) 95 % 91 % 92 %     Oxygen Saturation (Exit) 95 % 93 % 94 %     Rating of Perceived Exertion (Exercise) _0 Perceived Dyspnea (Exercise) 0 3 1     Duration -- Continue with 30 min of aerobic exercise without signs/symptoms of physical distress. Continue with 30 min of aerobic exercise without signs/symptoms of physical distress.     Intensity -- --  40-80% HRR THRR unchanged           Resistance Training   Training Prescription Yes Yes Yes     Weight blue bands blue bands blue bands     Reps 10-15 10-15 10-15     Time 10 Minutes 10 Minutes 10 Minutes           Treadmill   MPH -- 1.9 2.7     Grade -- 0 0     Minutes -- 15 15           NuStep   Level _1 SPM 80 80 80     Minutes _2 METs 1.7 2.6 3.4              Exercise Comments:   Exercise Comments     Row Name 07/17/20 1156           Exercise Comments Patient completed first day of exercise and tolerated well with no complaints or concerns. He was able to do 15 minutes on the treadmill and 15 minutes on the Nustep with no rest breaks. He was also able to do all resistance band exercises with no mobility issues. Will continue to monitor.                Exercise Goals and Review:   Exercise Goals     Row Name 07/09/20 1129 08/28/20 0941           Exercise Goals   Increase Physical Activity Yes Yes      Intervention Provide advice, education, support and counseling about physical activity/exercise  needs.;Develop an individualized exercise prescription for aerobic and resistive training based on initial evaluation findings, risk stratification, comorbidities and participant's personal goals. Provide advice, education, support and counseling about physical activity/exercise needs.;Develop an individualized exercise prescription for aerobic and resistive training based on initial evaluation findings, risk stratification, comorbidities and participant's personal goals.      Expected Outcomes Short Term: Attend rehab on a regular basis to increase amount of physical activity.;Long Term: Add in home exercise to make exercise part of routine and to increase amount of physical activity.;Long Term: Exercising regularly at least 3-5 days a week. Short Term: Attend rehab on a regular basis to increase amount of physical activity.;Long Term: Add in home exercise to make exercise part of routine and to increase amount of physical activity.;Long Term: Exercising regularly at least 3-5 days a week.      Increase Strength and Stamina Yes Yes      Intervention Provide advice, education, support and counseling about physical activity/exercise needs.;Develop an individualized exercise prescription for aerobic and resistive training based on initial  evaluation findings, risk stratification, comorbidities and participant's personal goals. Provide advice, education, support and counseling about physical activity/exercise needs.;Develop an individualized exercise prescription for aerobic and resistive training based on initial evaluation findings, risk stratification, comorbidities and participant's personal goals.      Expected Outcomes Short Term: Increase workloads from initial exercise prescription for resistance, speed, and METs.;Short Term: Perform resistance training exercises routinely during rehab and add in resistance training at home;Long Term: Improve cardiorespiratory fitness, muscular endurance and strength as  measured by increased METs and functional capacity (6MWT) Short Term: Increase workloads from initial exercise prescription for resistance, speed, and METs.;Short Term: Perform resistance training exercises routinely during rehab and add in resistance training at home;Long Term: Improve cardiorespiratory fitness, muscular endurance and strength as measured by increased METs and functional capacity (6MWT)      Able to understand and use rate of perceived exertion (RPE) scale Yes Yes      Intervention Provide education and explanation on how to use RPE scale Provide education and explanation on how to use RPE scale      Expected Outcomes Short Term: Able to use RPE daily in rehab to express subjective intensity level;Long Term:  Able to use RPE to guide intensity level when exercising independently Short Term: Able to use RPE daily in rehab to express subjective intensity level;Long Term:  Able to use RPE to guide intensity level when exercising independently      Able to understand and use Dyspnea scale Yes Yes      Intervention Provide education and explanation on how to use Dyspnea scale Provide education and explanation on how to use Dyspnea scale      Expected Outcomes Short Term: Able to use Dyspnea scale daily in rehab to express subjective sense of shortness of breath during exertion;Long Term: Able to use Dyspnea scale to guide intensity level when exercising independently Short Term: Able to use Dyspnea scale daily in rehab to express subjective sense of shortness of breath during exertion;Long Term: Able to use Dyspnea scale to guide intensity level when exercising independently      Knowledge and understanding of Target Heart Rate Range (THRR) Yes Yes      Intervention Provide education and explanation of THRR including how the numbers were predicted and where they are located for reference Provide education and explanation of THRR including how the numbers were predicted and where they are located  for reference      Expected Outcomes Short Term: Able to state/look up THRR;Long Term: Able to use THRR to govern intensity when exercising independently;Short Term: Able to use daily as guideline for intensity in rehab Short Term: Able to state/look up THRR;Long Term: Able to use THRR to govern intensity when exercising independently;Short Term: Able to use daily as guideline for intensity in rehab      Understanding of Exercise Prescription Yes Yes      Intervention Provide education, explanation, and written materials on patient's individual exercise prescription Provide education, explanation, and written materials on patient's individual exercise prescription      Expected Outcomes Short Term: Able to explain program exercise prescription;Long Term: Able to explain home exercise prescription to exercise independently Short Term: Able to explain program exercise prescription;Long Term: Able to explain home exercise prescription to exercise independently               Exercise Goals Re-Evaluation :  Exercise Goals Re-Evaluation     Row Name 07/30/20 1423 08/28/20 [phone]  Exercise Goal Re-Evaluation   Exercise Goals Review Increase Physical Activity;Increase Strength and Stamina;Able to understand and use rate of perceived exertion (RPE) scale;Able to understand and use Dyspnea scale;Knowledge and understanding of Target Heart Rate Range (THRR);Understanding of Exercise Prescription Increase Physical Activity;Increase Strength and Stamina;Able to understand and use rate of perceived exertion (RPE) scale;Able to understand and use Dyspnea scale;Knowledge and understanding of Target Heart Rate Range (THRR);Understanding of Exercise Prescription      Comments Shirlean Mylar has completed 3 exercise sessions and has tolerated very well so far. He is very motivated and is excited to be in the program. He has been able to do 15 minutes on the treadmill and 15 minutes on the Nustep with no rest  breaks. He also has been tolerating the warm up/cool down and resistance exercises well with no limitations. He is exercising at 2.8 METS on the Nustep and 2.4 METS on the treadmill. Will continue to monitor and progress as he is able. Shirlean Mylar has completed 9 exercise sessions and has tolerated very well so far. He is very motivated and has asked for the warm up and cool down handout. He has been given the warm up and cool down hand out and states that will do those exercises at home. He has been able to do 15 minutes on the treadmill and 15 minutes on the Nustep with no rest breaks. He also has been tolerating the warm up/cool down and resistance exercises well with no limitations. He is exercising at 3.4 METS on the Nustep and 2.7 METS on the treadmill. Will continue to monitor and progress as he is able.      Expected Outcomes Through exercise at rehab and home, the patient will decrease shortness of breath with daily activities and feel confident in carrying out an exercise regimn at home. Through exercise at rehab and home, the patient will decrease shortness of breath with daily activities and feel confident in carrying out an exercise regimn at home.               Discharge Exercise Prescription (Final Exercise Prescription Changes):  Exercise Prescription Changes - 08/21/20 1100       Response to Exercise   Blood Pressure (Admit) 118/70    Blood Pressure (Exercise) 140/80    Blood Pressure (Exit) 120/80    Heart Rate (Admit) 70 bpm    Heart Rate (Exercise) 117 bpm    Heart Rate (Exit) 88 bpm    Oxygen Saturation (Admit) 94 %    Oxygen Saturation (Exercise) 92 %    Oxygen Saturation (Exit) 94 %    Rating of Perceived Exertion (Exercise) 11    Perceived Dyspnea (Exercise) 1    Duration Continue with 30 min of aerobic exercise without signs/symptoms of physical distress.    Intensity THRR unchanged      Resistance Training   Training Prescription Yes    Weight blue bands    Reps  10-15    Time 10 Minutes      Treadmill   MPH 2.7    Grade 0    Minutes 15      NuStep   Level 4    SPM 80    Minutes 15    METs 3.4             Nutrition:  Target Goals: Understanding of nutrition guidelines, daily intake of sodium <1581m, cholesterol <2028m calories 30% from fat and 7% or less from saturated fats, daily to have 5  or more servings of fruits and vegetables.  Biometrics:  Pre Biometrics - 07/09/20 0941       Pre Biometrics   Height _0  (1.676 m)    Weight 84.2 kg    BMI (Calculated) 29.98    Grip Strength 32 kg              Nutrition Therapy Plan and Nutrition Goals:  Nutrition Therapy & Goals - 07/20/20 0748       Nutrition Therapy   Diet TLC; low sodium    Drug/Food Interactions Statins/Certain Fruits      Personal Nutrition Goals   Nutrition Goal Pt to identify food quantities necessary to achieve weight loss of 6-24 lb at graduation from pulmonary rehab.    Personal Goal #2 Pt to build a healthy plate including vegetables, fruits, whole grains, and low-fat dairy products in a heart healthy meal plan.    Personal Goal #3 Pt to read labels and monitor sodium intake to meet 1500 mg/day      Intervention Plan   Intervention Prescribe, educate and counsel regarding individualized specific dietary modifications aiming towards targeted core components such as weight, hypertension, lipid management, diabetes, heart failure and other comorbidities.;Nutrition handout(s) given to patient.    Expected Outcomes Long Term Goal: Adherence to prescribed nutrition plan.;Short Term Goal: A plan has been developed with personal nutrition goals set during dietitian appointment.             Nutrition Assessments:  MEDIFICTS Score Key: ?70 Need to make dietary changes  40-70 Heart Healthy Diet ? 40 Therapeutic Level Cholesterol Diet  Flowsheet Row PULMONARY REHAB CHRONIC OBSTRUCTIVE PULMONARY DISEASE from 07/19/2020 in [place]  Picture Your Plate Total Score on Admission 61      Picture Your Plate Scores: <42 Unhealthy dietary pattern with much room for improvement. 41-50 Dietary pattern unlikely to meet recommendations for good health and room for improvement. 51-60 More healthful dietary pattern, with some room for improvement.  >60 Healthy dietary pattern, although there may be some specific behaviors that could be improved.    Nutrition Goals Re-Evaluation:  Nutrition Goals Re-Evaluation     [place] Name 07/20/20 0749 07/31/20 0642 08/23/20 0705         Goals   Current Weight 178 lb (80.7 kg) 183 lb 13.8 oz (83.4 kg) 188 lb 15 oz (85.7 kg)     Nutrition Goal Pt to identify food quantities necessary to achieve weight loss of 6-24 lb at graduation from pulmonary rehab. Pt to identify food quantities necessary to achieve weight loss of 6-24 lb at graduation from pulmonary rehab. Pt to identify food quantities necessary to achieve weight loss of 6-24 lb at graduation from pulmonary rehab.           Personal Goal #2 Re-Evaluation   Personal Goal #2 Pt to build a healthy plate including vegetables, fruits, whole grains, and low-fat dairy products in a heart healthy meal plan. Pt to build a healthy plate including vegetables, fruits, whole grains, and low-fat dairy products in a heart healthy meal plan. Pt to build a healthy plate including vegetables, fruits, whole grains, and low-fat dairy products in a heart healthy meal plan.           Personal Goal #3 Re-Evaluation   Personal Goal #3 Pt to read labels and monitor sodium intake to meet 1500 mg/day Pt to read labels and monitor sodium intake to meet 1500 mg/day Pt to read  labels and monitor sodium intake to meet 1500 mg/day              Nutrition Goals Discharge (Final Nutrition Goals Re-Evaluation):  Nutrition Goals Re-Evaluation - 08/23/20 0705       Goals   Current Weight 188 lb 15 oz (85.7 kg)    Nutrition Goal Pt to identify food  quantities necessary to achieve weight loss of 6-24 lb at graduation from pulmonary rehab.      Personal Goal #2 Re-Evaluation   Personal Goal #2 Pt to build a healthy plate including vegetables, fruits, whole grains, and low-fat dairy products in a heart healthy meal plan.      Personal Goal #3 Re-Evaluation   Personal Goal #3 Pt to read labels and monitor sodium intake to meet 1500 mg/day             Psychosocial: Target Goals: Acknowledge presence or absence of significant depression and/or stress, maximize coping skills, provide positive support system. Participant is able to verbalize types and ability to use techniques and skills needed for reducing stress and depression.  Initial Review & Psychosocial Screening:  Initial Psych Review & Screening - 07/09/20 1323       Initial Review   Current issues with --   Talks every 6 months to either a psychiatrist or pharmacologist at the [facility] and takes wellbutrin and it keeps his depression stable.            Quality of Life Scores:  Scores of 19 and below usually indicate a poorer quality of life in these areas.  A difference of  2-3 points is a clinically meaningful difference.  A difference of 2-3 points in the total score of the Quality of Life Index has been associated with significant improvement in overall quality of life, self-image, physical symptoms, and general health in studies assessing change in quality of life.  PHQ-9: Recent Review Flowsheet Data     Depression screen [facility] 2/9 07/09/2020 07/09/2020   Decreased Interest 0 -   Down, Depressed, Hopeless 0 0   PHQ - 2 Score 0 0   Altered sleeping 3  -   Tired, decreased energy 0 -   Change in appetite 0 -   Feeling bad or failure about yourself  0 -   Trouble concentrating 0 -   Moving slowly or fidgety/restless 0 -   Suicidal thoughts 0 -   PHQ-9 Score 3 -   Difficult doing work/chores Not difficult at all -      Interpretation of Total  Score  Total Score Depression Severity:  1-4 = Minimal depression, 5-9 = Mild depression, 10-14 = Moderate depression, 15-19 = Moderately severe depression, 20-27 = Severe depression   Psychosocial Evaluation and Intervention:  Psychosocial Evaluation - 07/09/20 1325       Psychosocial Evaluation & Interventions   Interventions Encouraged to exercise with the program and follow exercise prescription    Comments No concerns identified.    Continue Psychosocial Services  No Follow up required             Psychosocial Re-Evaluation:  Psychosocial Re-Evaluation     [place] Name 07/30/20 1201 08/27/20 1321 08/27/20 1326         Psychosocial Re-Evaluation   Current issues with History of Depression;Current Depression;Current Sleep Concerns History of Depression;Current Depression;Current Sleep Concerns --     Comments "Shirlean Mylar" continues with his depression treatment through the [place] and it is stable at this time.  He has a positive attitude and seems to enjoy pulmonary rehab. Shirlean Mylar feels his depression is stable, his sleep concerns remain unchanged.  He reflects a positive attitude and seems to be enjoying exercissing in pulmonary rehab. Shirlean Mylar talks with a therapist through the [place] on a regular basis.     Expected Outcomes For "Shirlean Mylar" to maintain his current level of mental health and continue his current regime and treatment. For Robin's depression to continue to be maintained and he handles it in healthy ways. --     Interventions Encouraged to attend Pulmonary Rehabilitation for the exercise;Relaxation education;Stress management education Encouraged to attend Pulmonary Rehabilitation for the exercise --     Continue Psychosocial Services  No Follow up required No Follow up required --              Psychosocial Discharge (Final Psychosocial Re-Evaluation):  Psychosocial Re-Evaluation - 08/27/20 1326       Psychosocial Re-Evaluation   Comments Shirlean Mylar talks with a therapist through the  [place] on a regular basis.             Education: Education Goals: Education classes will be provided on a weekly basis, covering required topics. Participant will state understanding/return demonstration of topics presented.  Learning Barriers/Preferences:  Learning Barriers/Preferences - 07/09/20 0950       Learning Barriers/Preferences   Learning Barriers None    Learning Preferences Computer/Internet;Audio;Pictoral;Video             Education Topics: Risk Factor Reduction:  -Group instruction that is supported by a PowerPoint presentation. Instructor discusses the definition of a risk factor, different risk factors for pulmonary disease, and how the heart and lungs work together.     Nutrition for Pulmonary Patient:  -Group instruction provided by PowerPoint slides, verbal discussion, and written materials to support subject matter. The instructor gives an explanation and review of healthy diet recommendations, which includes a discussion on weight management, recommendations for fruit and vegetable consumption, as well as protein, fluid, caffeine, fiber, sodium, sugar, and alcohol. Tips for eating when patients are short of breath are discussed.   Pursed Lip Breathing:  -Group instruction that is supported by demonstration and informational handouts. Instructor discusses the benefits of pursed lip and diaphragmatic breathing and detailed demonstration on how to preform both.     Oxygen Safety:  -Group instruction provided by PowerPoint, verbal discussion, and written material to support subject matter. There is an overview of "What is Oxygen" and "Why do we need it".  Instructor also reviews how to create a safe environment for oxygen use, the importance of using oxygen as prescribed, and the risks of noncompliance. There is a brief discussion on traveling with oxygen and resources the patient may utilize.   Oxygen Equipment:  -Group instruction provided by [facility]  Staff utilizing handouts, written materials, and equipment demonstrations.   Signs and Symptoms:  -Group instruction provided by written material and verbal discussion to support subject matter. Warning signs and symptoms of infection, stroke, and heart attack are reviewed and when to call the physician/911 reinforced. Tips for preventing the spread of infection discussed.   Advanced Directives:  -Group instruction provided by verbal instruction and written material to support subject matter. Instructor reviews Advanced Directive laws and proper instruction for filling out document.   Pulmonary Video:  -Group video education that reviews the importance of medication and oxygen compliance, exercise, good nutrition, pulmonary hygiene, and pursed lip and diaphragmatic breathing for the pulmonary patient.   Exercise  for the Pulmonary Patient:  -Group instruction that is supported by a PowerPoint presentation. Instructor discusses benefits of exercise, core components of exercise, frequency, duration, and intensity of an exercise routine, importance of utilizing pulse oximetry during exercise, safety while exercising, and options of places to exercise outside of rehab.   Flowsheet Row PULMONARY REHAB CHRONIC OBSTRUCTIVE PULMONARY DISEASE from 08/23/2020 in [place]  Date 07/26/20  Educator Handout       Pulmonary Medications:  -Verbally interactive group education provided by instructor with focus on inhaled medications and proper administration. Flowsheet Row PULMONARY REHAB CHRONIC OBSTRUCTIVE PULMONARY DISEASE from 08/23/2020 in [place]  Date 08/16/20  Educator Handout       Anatomy and Physiology of the Respiratory System and Intimacy:  -Group instruction provided by PowerPoint, verbal discussion, and written material to support subject matter. Instructor reviews respiratory cycle and anatomical components of the  respiratory system and their functions. Instructor also reviews differences in obstructive and restrictive respiratory diseases with examples of each. Intimacy, Sex, and Sexuality differences are reviewed with a discussion on how relationships can change when diagnosed with pulmonary disease. Common sexual concerns are reviewed. Flowsheet Row PULMONARY REHAB CHRONIC OBSTRUCTIVE PULMONARY DISEASE from 08/23/2020 in [place]  Date 08/23/20  Educator Handout       MD DAY -A group question and answer session with a medical doctor that allows participants to ask questions that relate to their pulmonary disease state.   OTHER EDUCATION -Group or individual verbal, written, or video instructions that support the educational goals of the pulmonary rehab program. [place] from 08/23/2020 in [place]  Date 08/02/20  Educator RD H/O  [MyPlate]       Holiday Eating Survival Tips:  -Group instruction provided by [employer], verbal discussion, and written materials to support subject matter. The instructor gives patients tips, tricks, and techniques to help them not only survive but enjoy the holidays despite the onslaught of food that accompanies the holidays.   Knowledge Questionnaire Score:  Knowledge Questionnaire Score - 07/09/20 1325       Knowledge Questionnaire Score   Pre Score 9/18             Core Components/Risk Factors/Patient Goals at Admission:  Personal Goals and Risk Factors at Admission - 07/09/20 1326       Core Components/Risk Factors/Patient Goals on Admission   Improve shortness of breath with ADL's Yes    Intervention Provide education, individualized exercise plan and daily activity instruction to help decrease symptoms of SOB with activities of daily living.    Expected Outcomes Short Term: Improve cardiorespiratory fitness to  achieve a reduction of symptoms when performing ADLs;Long Term: Be able to perform more ADLs without symptoms or delay the onset of symptoms             Core Components/Risk Factors/Patient Goals Review:   Goals and Risk Factor Review     Row Name 07/09/20 1326 07/30/20 1204 08/27/20 1324         Core Components/Risk Factors/Patient Goals Review   Personal Goals Review Develop more efficient breathing techniques such as purse lipped breathing and diaphragmatic breathing and practicing self-pacing with activity.;Increase knowledge of respiratory medications and ability to use respiratory devices properly.;Improve shortness of breath with ADL's Develop more efficient breathing techniques such as purse lipped breathing and diaphragmatic breathing and practicing self-pacing  with activity.;Increase knowledge of respiratory medications and ability to use respiratory devices properly.;Improve shortness of breath with ADL's Develop more efficient breathing techniques such as purse lipped breathing and diaphragmatic breathing and practicing self-pacing with activity.;Increase knowledge of respiratory medications and ability to use respiratory devices properly.;Improve shortness of breath with ADL's     Review -- "Shirlean Mylar" is learning how to exercise safely and appropriately for someone with COPD.  He has attended 3 exercise sessions and it is too early to see progression towards program goals. Robin's strength, stamina, and conditioning have improved and he has less shortness of breath than when he began the program.  We are teaching him how to exercise safely based on his COPD.     Expected Outcomes -- See admission goals. See admission goals.              Core Components/Risk Factors/Patient Goals at Discharge (Final Review):   Goals and Risk Factor Review - 08/27/20 1324       Core Components/Risk Factors/Patient Goals Review   Personal Goals Review Develop more efficient breathing techniques  such as purse lipped breathing and diaphragmatic breathing and practicing self-pacing with activity.;Increase knowledge of respiratory medications and ability to use respiratory devices properly.;Improve shortness of breath with ADL's    Review Robin's strength, stamina, and conditioning have improved and he has less shortness of breath than when he began the program.  We are teaching him how to exercise safely based on his COPD.    Expected Outcomes See admission goals.             ITP Comments:   Comments: ITP REVIEW Pt is making expected progress toward pulmonary rehab goals after completing 9 sessions. Recommend continued exercise, life style modification, education, and utilization of breathing techniques to increase stamina and strength and decrease shortness of breath with exertion.

## 2020-08-30 ENCOUNTER — Other Ambulatory Visit: Payer: Self-pay

## 2020-08-30 ENCOUNTER — Encounter (HOSPITAL_COMMUNITY)
Admission: RE | Admit: 2020-08-30 | Discharge: 2020-08-30 | Disposition: A | Discharge status: Home / Self Care | Payer: No Typology Code available for payment source | Source: Ambulatory Visit | Attending: Cardiology | Admitting: Cardiology

## 2020-08-30 DIAGNOSIS — J449 Chronic obstructive pulmonary disease, unspecified: Secondary | ICD-10-CM | POA: Diagnosis not present

## 2020-08-30 NOTE — Progress Notes (Signed)
Daily Session Note  Patient Details  Name: Tanner Chang MRN: [id number] Date of Birth: Jun 23, 1955 Referring Provider:   April Manson Pulmonary Rehab Walk Test from 07/09/2020 in [place]  Referring Provider Dr. Gwenette Greet ([place])       Encounter Date: 08/30/2020  Check In:  Session Check In - 08/30/20 1114       Check-In   Supervising physician immediately available to respond to emergencies [facility] immediately available    Physician(s) Dr. Mal Misty    Location [facility]    Staff Present Trish Fountain, RN, Quentin Ore, MS, ACSM-CEP, Exercise Physiologist;David Lilyan Punt, MS, ACSM-CEP, CCRP, Exercise Physiologist;Carlette Wilber Oliphant, RN, BSN    Virtual Visit No    Medication changes reported     No    Fall or balance concerns reported    No    Tobacco Cessation No Change    Warm-up and Cool-down Performed as group-led instruction    Resistance Training Performed Yes    VAD Patient? No    PAD/SET Patient? No      Pain Assessment   Currently in Pain? No/denies    Multiple Pain Sites No             Capillary Blood Glucose: No results found for this or any previous visit (from the past 24 hour(s)).    Social History   Tobacco Use  Smoking Status Former   Years: 20.00   Types: Cigarettes  Smokeless Tobacco Former    Goals Met:  Proper associated with RPD/PD & O2 Sat Independence with exercise equipment Exercise tolerated well No report of cardiac concerns or symptoms Strength training completed today  Goals Unmet:  Not Applicable  Comments: Service time is from 1018 to 1124    Dr. Fransico Him is Medical Director for Cardiac Rehab at [facility].

## 2020-09-04 ENCOUNTER — Other Ambulatory Visit: Payer: Self-pay

## 2020-09-04 ENCOUNTER — Encounter (HOSPITAL_COMMUNITY)
Admission: RE | Admit: 2020-09-04 | Discharge: 2020-09-04 | Disposition: A | Discharge status: Home / Self Care | Payer: No Typology Code available for payment source | Source: Ambulatory Visit | Attending: Cardiology | Admitting: Cardiology

## 2020-09-04 VITALS — Wt 181.0 lb

## 2020-09-04 DIAGNOSIS — J449 Chronic obstructive pulmonary disease, unspecified: Secondary | ICD-10-CM | POA: Diagnosis not present

## 2020-09-04 NOTE — Progress Notes (Signed)
Daily Session Note  Patient Details  Name: Tanner Chang MRN: [id number] Date of Birth: 11/12/1955 Referring Provider:   April Manson Pulmonary Rehab Walk Test from 07/09/2020 in [place]  Referring Provider Dr. Gwenette Greet ([place])       Encounter Date: 09/04/2020  Check In:  Session Check In - 09/04/20 1159       Check-In   Supervising physician immediately available to respond to emergencies [facility] immediately available    Physician(s) Dr. Mal Misty    [facility]    Staff Present Leda Roys, MS, ACSM-CEP, Exercise Physiologist;Kaylee Rosana Hoes, MS, ACSM-CEP, Exercise Physiologist;Latunya Kissick Ysidro Evert, RN    Virtual Visit No    Medication changes reported     No    Fall or balance concerns reported    No    Tobacco Cessation No Change    Warm-up and Cool-down Performed as group-led instruction    Resistance Training Performed Yes    VAD Patient? No    PAD/SET Patient? No      Pain Assessment   Currently in Pain? No/denies    Multiple Pain Sites No             Capillary Blood Glucose: No results found for this or any previous visit (from the past 24 hour(s)).   Exercise Prescription Changes - 09/04/20 1200       Response to Exercise   Blood Pressure (Admit) 100/64    Blood Pressure (Exercise) 108/70    Blood Pressure (Exit) 108/60    Heart Rate (Admit) 110 bpm    Heart Rate (Exercise) 122 bpm    Heart Rate (Exit) 103 bpm    Oxygen Saturation (Admit) 94 %    Oxygen Saturation (Exercise) 93 %    Oxygen Saturation (Exit) 94 %    Rating of Perceived Exertion (Exercise) 11    Perceived Dyspnea (Exercise) 1    Duration Continue with 30 min of aerobic exercise without signs/symptoms of physical distress.    Intensity THRR unchanged      Resistance Training   Training Prescription Yes    Weight blue bands    Reps 10-15    Time 10 Minutes      Treadmill   MPH 2.4    Grade 0    Minutes 15      NuStep    Level 4    SPM 80    Minutes 15    METs 3.2             Social History   Tobacco Use  Smoking Status Former   Years: 20.00   Types: Cigarettes  Smokeless Tobacco Former    Goals Met:  Exercise tolerated well No report of cardiac concerns or symptoms Strength training completed today  Goals Unmet:  Not Applicable  Comments: Service time is from 1010 to 1135    Dr. Fransico Him is Market researcher for Cardiac Rehab at [facility].

## 2020-09-06 ENCOUNTER — Encounter (HOSPITAL_COMMUNITY)
Admission: RE | Admit: 2020-09-06 | Discharge: 2020-09-06 | Disposition: A | Discharge status: Home / Self Care | Payer: No Typology Code available for payment source | Source: Ambulatory Visit | Attending: Cardiology | Admitting: Cardiology

## 2020-09-06 ENCOUNTER — Other Ambulatory Visit: Payer: Self-pay

## 2020-09-06 DIAGNOSIS — J449 Chronic obstructive pulmonary disease, unspecified: Secondary | ICD-10-CM

## 2020-09-06 NOTE — Progress Notes (Signed)
Daily Session Note  Patient Details  Name: Tanner Chang MRN: [id number] Date of Birth: 01/14/1955 Referring Provider:   Flowsheet Row Pulmonary Rehab Walk Test from 07/09/2020 in [facility]  [place]. Clance ([place])       Encounter Date: 09/06/2020  Check In:  Session Check In - 09/06/20 1147       Check-In   Supervising physician immediately available to respond to emergencies [facility] immediately available    Physician(s) Dr. Ghimire    [facility]    Staff Present David Makemson, MS, ACSM-CEP, CCRP, Exercise Physiologist;Jessica Martin, MS, ACSM-CEP, Exercise Physiologist;Kaylee Davis, MS, ACSM-CEP, Exercise Physiologist;Lisa Hughes, RN    Virtual Visit No    Medication changes reported     No    Fall or balance concerns reported    No    Tobacco Cessation No Change    Warm-up and Cool-down Performed as group-led instruction    Resistance Training Performed Yes    VAD Patient? No    PAD/SET Patient? No      Pain Assessment   Currently in Pain? No/denies    Multiple Pain Sites No             Capillary Blood Glucose: No results found for this or any previous visit (from the past 24 hour(s)).    Social History   Tobacco Use  Smoking Status Former   Years: 20.00   Types: Cigarettes  Smokeless Tobacco Former    Goals Met:  Exercise tolerated well No report of cardiac concerns or symptoms Strength training completed today  Goals Unmet:  Not Applicable  Comments: Service time is from 1015 to 1130    Dr. Traci Turner is Medical Director for Cardiac Rehab at [facility]. 

## 2020-09-11 ENCOUNTER — Other Ambulatory Visit: Payer: Self-pay

## 2020-09-11 ENCOUNTER — Encounter (HOSPITAL_COMMUNITY)
Admission: RE | Admit: 2020-09-11 | Discharge: 2020-09-11 | Disposition: A | Discharge status: Home / Self Care | Payer: No Typology Code available for payment source | Source: Ambulatory Visit | Attending: Cardiology | Admitting: Cardiology

## 2020-09-11 DIAGNOSIS — J449 Chronic obstructive pulmonary disease, unspecified: Secondary | ICD-10-CM | POA: Diagnosis not present

## 2020-09-11 NOTE — Progress Notes (Signed)
Daily Session Note  Patient Details  Name: Tanner Chang MRN: [id number] Date of Birth: 02/04/1955 Referring Provider:   Flowsheet Row Pulmonary Rehab Walk Test from 07/09/2020 in [facility]  [place] Provider [place]. Clance ([place])       Encounter Date: 09/11/2020  Check In:  Session Check In - 09/11/20 1201       Check-In   Supervising physician immediately available to respond to emergencies [facility] immediately available    Physician(s) Dr. Pahwani    [facility]    Staff Present Joan Behrens, RN, BSN;Kaylee Davis, MS, ACSM-CEP, Exercise Physiologist;Lisa Hughes, RN    Virtual Visit No    Medication changes reported     No    Fall or balance concerns reported    No    Tobacco Cessation No Change    Warm-up and Cool-down Performed as group-led instruction    Resistance Training Performed Yes    VAD Patient? No    PAD/SET Patient? No      Pain Assessment   Currently in Pain? No/denies    Multiple Pain Sites No             Capillary Blood Glucose: No results found for this or any previous visit (from the past 24 hour(s)).    Social History   Tobacco Use  Smoking Status Former   Years: 20.00   Types: Cigarettes  Smokeless Tobacco Former    Goals Met:  Exercise tolerated well No report of concerns or symptoms today Strength training completed today  Goals Unmet:  Not Applicable  Comments: Service time is from 1016 to 1135    Dr. Traci Turner is Medical Director for Cardiac Rehab at [facility]. 

## 2020-09-13 ENCOUNTER — Other Ambulatory Visit: Payer: Self-pay

## 2020-09-13 ENCOUNTER — Encounter (HOSPITAL_COMMUNITY)
Admission: RE | Admit: 2020-09-13 | Discharge: 2020-09-13 | Disposition: A | Discharge status: Home / Self Care | Payer: No Typology Code available for payment source | Source: Ambulatory Visit | Attending: Cardiology | Admitting: Cardiology

## 2020-09-13 VITALS — Wt 184.7 lb

## 2020-09-13 DIAGNOSIS — J449 Chronic obstructive pulmonary disease, unspecified: Secondary | ICD-10-CM | POA: Insufficient documentation

## 2020-09-13 NOTE — Progress Notes (Signed)
Daily Session Note  Patient Details  Name: Tanner Chang MRN: [id number] Date of Birth: 05/10/1955 Referring Provider:   Flowsheet Row Pulmonary Rehab Walk Test from 07/09/2020 in [facility]  [place]. Clance ([place]       Encounter Date: 09/13/2020  Check In:  Session Check In - 09/13/20 1143       Check-In   Supervising physician immediately available to respond to emergencies [facility] immediately available    Physician(s) Dr. Pahwani    [facility]    Staff Present Joan Behrens, RN, BSN;Kaylee Davis, MS, ACSM-CEP, Exercise Physiologist;Lisa Hughes, RN    Virtual Visit No    Medication changes reported     No    Fall or balance concerns reported    No    Tobacco Cessation No Change    Warm-up and Cool-down Performed as group-led instruction    Resistance Training Performed Yes    VAD Patient? No      Pain Assessment   Currently in Pain? No/denies    Pain Score 0-No pain    Multiple Pain Sites No             Capillary Blood Glucose: No results found for this or any previous visit (from the past 24 hour(s)).    Social History   Tobacco Use  Smoking Status Former   Years: 20.00   Types: Cigarettes  Smokeless Tobacco Former    Goals Met:  Exercise tolerated well No report of concerns or symptoms today Strength training completed today  Goals Unmet:  Not Applicable  Comments: Service time is from 1015 to 1131     Dr. Traci Turner is Medical Director for Cardiac Rehab at [facility]. 

## 2020-09-18 NOTE — Progress Notes (Signed)
Discharge Progress Report  Patient Details  Name: CISCO KINDT MRN: [id number] Date of Birth: 08-03-1955 Referring Provider:   [facility] Walk Test from 07/09/2020 in [place]  Referring Provider Dr. Gwenette Greet ([place])        Number of Visits: 15  Reason for Discharge:  Patient reached a stable level of exercise. Patient independent in their exercise. Patient has met program and personal goals.  Smoking History:  Social History   Tobacco Use  Smoking Status Former   Years: 20.00   Types: Cigarettes  Smokeless Tobacco Former    Diagnosis:  Chronic obstructive pulmonary disease, unspecified COPD type ([place])  ADL [facility]SD:  Pulmonary Assessment Scores     Row Name 07/09/20 1008 07/09/20 1136 07/09/20 1318     ADL UCSD   ADL Phase Entry Entry Entry   SOB Score total 72 -- 72     CAT Score   CAT Score 26 -- 26     mMRC Score   mMRC Score -- 3 --    Row Name 09/13/20 1205         ADL UCSD   ADL Phase Exit     SOB Score total 21           CAT Score   CAT Score 15           mMRC Score   mMRC Score 3              Initial Exercise Prescription:  Initial Exercise Prescription - 07/09/20 1100       Date of Initial Exercise RX and Referring Provider   Date 07/09/20    Referring Provider Dr. Gwenette Greet ([place])    Expected Discharge Date 07/09/20      Treadmill   MPH 1.6    Grade 0    Minutes 15      NuStep   Level 2    SPM 80    Minutes 15      Prescription Details   Frequency (times per week) 2    Duration Progress to 30 minutes of continuous aerobic without signs/symptoms of physical distress      Intensity   THRR 40-80% of Max Heartrate 63-126    Ratings of Perceived Exertion 11-13    Perceived Dyspnea 0-4      Progression   Progression Continue to progress workloads to maintain intensity without signs/symptoms of physical distress.      Resistance Training   Training Prescription Yes     Weight Blue bands    Reps 10-15             Discharge Exercise Prescription (Final Exercise Prescription Changes):  Exercise Prescription Changes - 09/04/20 1200       Response to Exercise   Blood Pressure (Admit) 100/64    Blood Pressure (Exercise) 108/70    Blood Pressure (Exit) 108/60    Heart Rate (Admit) 110 bpm    Heart Rate (Exercise) 122 bpm    Heart Rate (Exit) 103 bpm    Oxygen Saturation (Admit) 94 %    Oxygen Saturation (Exercise) 93 %    Oxygen Saturation (Exit) 94 %    Rating of Perceived Exertion (Exercise) 11    Perceived Dyspnea (Exercise) 1    Duration Continue with 30 min of aerobic exercise without signs/symptoms of physical distress.    Intensity THRR unchanged      Resistance Training   Training Prescription Yes  Weight blue bands    Reps 10-15    Time 10 Minutes      Treadmill   MPH 2.4    Grade 0    Minutes 15      NuStep   Level 4    SPM 80    Minutes 15    METs 3.2             Functional Capacity:  6 Minute Walk     Row Name 07/09/20 1130 09/13/20 1158       6 Minute Walk   Phase Initial Discharge    Distance 1177 feet 1710 feet    Distance % Change -- 45.28 %    Distance Feet Change -- 533 ft    Walk Time 6 minutes 6 minutes    # of Rest Breaks 0 0    MPH 2.23 3.24    METS 3.37 4.14    RPE 11 13    Perceived Dyspnea  3 2    VO2 Peak 11.79 14.48    Symptoms No No    Resting HR 91 bpm 85 bpm    Resting BP 92/60 120/70    Resting Oxygen Saturation  95 % 97 %    Exercise Oxygen Saturation  during 6 min walk 92 % 93 %    Max Ex. HR 157 bpm 131 bpm    Max Ex. BP 118/76 124/68    2 Minute Post BP 98/64 106/64         Interval HR   1 Minute HR 130 99    2 Minute HR 139 117    3 Minute HR 129 129    4 Minute HR 127 128    5 Minute HR 157 131    6 Minute HR 123 129    2 Minute Post HR 96 82    Interval Heart Rate? Yes Yes         Interval Oxygen   Interval Oxygen? Yes Yes    Baseline Oxygen Saturation % 95  % 97 %    1 Minute Oxygen Saturation % 93 % 95 %    1 Minute Liters of Oxygen 0 L 0 L    2 Minute Oxygen Saturation % 93 % 93 %    2 Minute Liters of Oxygen 0 L 0 L    3 Minute Oxygen Saturation % 93 % 93 %    3 Minute Liters of Oxygen 0 L 0 L    4 Minute Oxygen Saturation % 92 % 94 %    4 Minute Liters of Oxygen 0 L 0 L    5 Minute Oxygen Saturation % 92 % 94 %    5 Minute Liters of Oxygen 0 L 0 L    6 Minute Oxygen Saturation % 92 % 93 %    6 Minute Liters of Oxygen 0 L 0 L    2 Minute Post Oxygen Saturation % 97 % 93 %    2 Minute Post Liters of Oxygen 0 L 0 L             Psychological, QOL, Others - Outcomes: PHQ 2/9: Depression screen [facility] 2/9 09/13/2020 07/09/2020 07/09/2020  Decreased Interest 0 0 -  Down, Depressed, Hopeless 0 0 0  PHQ - 2 Score 0 0 0  Altered sleeping 0 3 -  Tired, decreased energy 0 0 -  Change in appetite 0 0 -  Feeling bad or failure about yourself  0 0 -  Trouble concentrating 0 0 -  Moving slowly or fidgety/restless 0 0 -  Suicidal thoughts 0 0 -  PHQ-9 Score 0 3 -  Difficult doing work/chores Not difficult at all Not difficult at all -    Quality of Life:   Personal Goals: Goals established at orientation with interventions provided to work toward goal.  Personal Goals and Risk Factors at Admission - 07/09/20 1326       Core Components/Risk Factors/Patient Goals on Admission   Improve shortness of breath with ADL's Yes    Intervention Provide education, individualized exercise plan and daily activity instruction to help decrease symptoms of SOB with activities of daily living.    Expected Outcomes Short Term: Improve cardiorespiratory fitness to achieve a reduction of symptoms when performing ADLs;Long Term: Be able to perform more ADLs without symptoms or delay the onset of symptoms              Personal Goals Discharge:  Goals and Risk Factor Review     Row Name 07/09/20 1326 07/30/20 1204 08/27/20 1324         Core  Components/Risk Factors/Patient Goals Review   Personal Goals Review Develop more efficient breathing techniques such as purse lipped breathing and diaphragmatic breathing and practicing self-pacing with activity.;Increase knowledge of respiratory medications and ability to use respiratory devices properly.;Improve shortness of breath with ADL's Develop more efficient breathing techniques such as purse lipped breathing and diaphragmatic breathing and practicing self-pacing with activity.;Increase knowledge of respiratory medications and ability to use respiratory devices properly.;Improve shortness of breath with ADL's Develop more efficient breathing techniques such as purse lipped breathing and diaphragmatic breathing and practicing self-pacing with activity.;Increase knowledge of respiratory medications and ability to use respiratory devices properly.;Improve shortness of breath with ADL's     Review -- "Shirlean Mylar" is learning how to exercise safely and appropriately for someone with COPD.  He has attended 3 exercise sessions and it is too early to see progression towards program goals. Robin's strength, stamina, and conditioning have improved and he has less shortness of breath than when he began the program.  We are teaching him how to exercise safely based on his COPD.     Expected Outcomes -- See admission goals. See admission goals.              Exercise Goals and Review:  Exercise Goals     Row Name 07/09/20 1129 08/28/20 0941           Exercise Goals   Increase Physical Activity Yes Yes      Intervention Provide advice, education, support and counseling about physical activity/exercise needs.;Develop an individualized exercise prescription for aerobic and resistive training based on initial evaluation findings, risk stratification, comorbidities and participant's personal goals. Provide advice, education, support and counseling about physical activity/exercise needs.;Develop an  individualized exercise prescription for aerobic and resistive training based on initial evaluation findings, risk stratification, comorbidities and participant's personal goals.      Expected Outcomes Short Term: Attend rehab on a regular basis to increase amount of physical activity.;Long Term: Add in home exercise to make exercise part of routine and to increase amount of physical activity.;Long Term: Exercising regularly at least 3-5 days a week. Short Term: Attend rehab on a regular basis to increase amount of physical activity.;Long Term: Add in home exercise to make exercise part of routine and to increase amount of physical activity.;Long Term: Exercising regularly at least 3-5 days a week.  Increase Strength and Stamina Yes Yes      Intervention Provide advice, education, support and counseling about physical activity/exercise needs.;Develop an individualized exercise prescription for aerobic and resistive training based on initial evaluation findings, risk stratification, comorbidities and participant's personal goals. Provide advice, education, support and counseling about physical activity/exercise needs.;Develop an individualized exercise prescription for aerobic and resistive training based on initial evaluation findings, risk stratification, comorbidities and participant's personal goals.      Expected Outcomes Short Term: Increase workloads from initial exercise prescription for resistance, speed, and METs.;Short Term: Perform resistance training exercises routinely during rehab and add in resistance training at home;Long Term: Improve cardiorespiratory fitness, muscular endurance and strength as measured by increased METs and functional capacity (6MWT) Short Term: Increase workloads from initial exercise prescription for resistance, speed, and METs.;Short Term: Perform resistance training exercises routinely during rehab and add in resistance training at home;Long Term: Improve  cardiorespiratory fitness, muscular endurance and strength as measured by increased METs and functional capacity (6MWT)      Able to understand and use rate of perceived exertion (RPE) scale Yes Yes      Intervention Provide education and explanation on how to use RPE scale Provide education and explanation on how to use RPE scale      Expected Outcomes Short Term: Able to use RPE daily in rehab to express subjective intensity level;Long Term:  Able to use RPE to guide intensity level when exercising independently Short Term: Able to use RPE daily in rehab to express subjective intensity level;Long Term:  Able to use RPE to guide intensity level when exercising independently      Able to understand and use Dyspnea scale Yes Yes      Intervention Provide education and explanation on how to use Dyspnea scale Provide education and explanation on how to use Dyspnea scale      Expected Outcomes Short Term: Able to use Dyspnea scale daily in rehab to express subjective sense of shortness of breath during exertion;Long Term: Able to use Dyspnea scale to guide intensity level when exercising independently Short Term: Able to use Dyspnea scale daily in rehab to express subjective sense of shortness of breath during exertion;Long Term: Able to use Dyspnea scale to guide intensity level when exercising independently      Knowledge and understanding of Target Heart Rate Range (THRR) Yes Yes      Intervention Provide education and explanation of THRR including how the numbers were predicted and where they are located for reference Provide education and explanation of THRR including how the numbers were predicted and where they are located for reference      Expected Outcomes Short Term: Able to state/look up THRR;Long Term: Able to use THRR to govern intensity when exercising independently;Short Term: Able to use daily as guideline for intensity in rehab Short Term: Able to state/look up THRR;Long Term: Able to use THRR  to govern intensity when exercising independently;Short Term: Able to use daily as guideline for intensity in rehab      Understanding of Exercise Prescription Yes Yes      Intervention Provide education, explanation, and written materials on patient's individual exercise prescription Provide education, explanation, and written materials on patient's individual exercise prescription      Expected Outcomes Short Term: Able to explain program exercise prescription;Long Term: Able to explain home exercise prescription to exercise independently Short Term: Able to explain program exercise prescription;Long Term: Able to explain home exercise prescription to exercise independently  Exercise Goals Re-Evaluation:  Exercise Goals Re-Evaluation     Row Name 07/30/20 1423 08/28/20 0942           Exercise Goal Re-Evaluation   Exercise Goals Review Increase Physical Activity;Increase Strength and Stamina;Able to understand and use rate of perceived exertion (RPE) scale;Able to understand and use Dyspnea scale;Knowledge and understanding of Target Heart Rate Range (THRR);Understanding of Exercise Prescription Increase Physical Activity;Increase Strength and Stamina;Able to understand and use rate of perceived exertion (RPE) scale;Able to understand and use Dyspnea scale;Knowledge and understanding of Target Heart Rate Range (THRR);Understanding of Exercise Prescription      Comments Shirlean Mylar has completed 3 exercise sessions and has tolerated very well so far. He is very motivated and is excited to be in the program. He has been able to do 15 minutes on the treadmill and 15 minutes on the Nustep with no rest breaks. He also has been tolerating the warm up/cool down and resistance exercises well with no limitations. He is exercising at 2.8 METS on the Nustep and 2.4 METS on the treadmill. Will continue to monitor and progress as he is able. Shirlean Mylar has completed 9 exercise sessions and has tolerated  very well so far. He is very motivated and has asked for the warm up and cool down handout. He has been given the warm up and cool down hand out and states that will do those exercises at home. He has been able to do 15 minutes on the treadmill and 15 minutes on the Nustep with no rest breaks. He also has been tolerating the warm up/cool down and resistance exercises well with no limitations. He is exercising at 3.4 METS on the Nustep and 2.7 METS on the treadmill. Will continue to monitor and progress as he is able.      Expected Outcomes Through exercise at rehab and home, the patient will decrease shortness of breath with daily activities and feel confident in carrying out an exercise regimn at home. Through exercise at rehab and home, the patient will decrease shortness of breath with daily activities and feel confident in carrying out an exercise regimn at home.               Nutrition & Weight - Outcomes:  Pre Biometrics - 07/09/20 0941       Pre Biometrics   Height _0  (1.676 m)    Weight 84.2 kg    BMI (Calculated) 29.98    Grip Strength 32 kg             Post Biometrics - 09/13/20 1204        Post  Biometrics   Weight 83.8 kg    Grip Strength 32 kg             Nutrition:  Nutrition Therapy & Goals - 07/20/20 0748       Nutrition Therapy   Diet TLC; low sodium    Drug/Food Interactions Statins/Certain Fruits      Personal Nutrition Goals   Nutrition Goal Pt to identify food quantities necessary to achieve weight loss of 6-24 lb at graduation from pulmonary rehab.    Personal Goal #2 Pt to build a healthy plate including vegetables, fruits, whole grains, and low-fat dairy products in a heart healthy meal plan.    Personal Goal #3 Pt to read labels and monitor sodium intake to meet 1500 mg/day      Intervention Plan   Intervention Prescribe, educate and counsel regarding individualized specific dietary  modifications aiming towards targeted core components  such as weight, hypertension, lipid management, diabetes, heart failure and other comorbidities.;Nutrition handout(s) given to patient.    Expected Outcomes Long Term Goal: Adherence to prescribed nutrition plan.;Short Term Goal: A plan has been developed with personal nutrition goals set during dietitian appointment.             Nutrition Discharge:   Education Questionnaire Score:  Knowledge Questionnaire Score - 09/13/20 1304       Knowledge Questionnaire Score   Post Score 15/18             Goals reviewed with patient; copy given to patient.

## 2020-10-29 ENCOUNTER — Encounter: Payer: Self-pay | Admitting: Pulmonary Disease

## 2020-10-29 ENCOUNTER — Other Ambulatory Visit: Payer: Self-pay

## 2020-10-29 ENCOUNTER — Ambulatory Visit (INDEPENDENT_AMBULATORY_CARE_PROVIDER_SITE_OTHER): Payer: No Typology Code available for payment source | Admitting: Pulmonary Disease

## 2020-10-29 VITALS — BP 118/80 | HR 103 | Temp 97.9°F | Ht 66.0 in | Wt 184.0 lb

## 2020-10-29 DIAGNOSIS — J439 Emphysema, unspecified: Secondary | ICD-10-CM

## 2020-10-29 DIAGNOSIS — J986 Disorders of diaphragm: Secondary | ICD-10-CM

## 2020-10-29 NOTE — Progress Notes (Signed)
@Patient  ID: , male    DOB: [birth year], 65 y.o.   MRN: [id number]  Chief Complaint  Patient presents with   COPD    Referring provider: [id number], PA  HPI:   65 y.o. with history of COPD based on PFTs at the 66 (I cannot view these) whom are seen in consultation for evaluation of COPD.  Note from referring provider reviewed.  Overall, patient doing well.  He recently completed pulmonary rehab.  He states this helped tremendously with his breathing.  He has baseline dyspnea but this is improved.  Worse on inclines or stairs.  No time of day where things are better or worse.  No position make things better or worse.  No seasonal environmental factors she can identify things better or worse.  Uses Symbicort.  He finds is effective.  Use albuterol as needed and finds it effective as well.  He reports infrequent use of albuterol.  Reviewed most recent chest x-ray 06/29/2020 that shows left hemidiaphragm elevation on my interpretation.  Reviewed most recent CT chest 05/24/2020 that on my review and interpretation shows emphysema, left hemidiaphragm elevation with volume loss on the left.  Most recent TTE 02/2020 reviewed this demonstrates diastolic dysfunction, grade 1, normal LA size, normal RV size and function.  PMH: Tobacco abuse in remission, hypertension Surgical history: Back surgery Family history:History reviewed. No pertinent family history. Social history: Former smoker, quit some years ago, lives in [place] / Pulmonary Flowsheets:   ACT:  No flowsheet data found.  MMRC: No flowsheet data found.  Epworth:  No flowsheet data found.  Tests:   FENO:  No results found for: NITRICOXIDE  PFT: No flowsheet data found.  WALK:  SIX MIN WALK 09/13/2020 07/09/2020  2 Minute Oxygen Saturation % 93 93  2 Minute HR 117 139  4 Minute Oxygen Saturation % 94 92  4 Minute HR 128 127  6 Minute Oxygen Saturation % 93 92  6 Minute HR 129 123   Prior walks demonstrates good heart response, no desaturation requiring oxygen on my review interpretation  Imaging:  Personally reviewed and as per EMR discussion this note Lab Results: Personally reviewed CBC    Component Value Date/Time   WBC 7.3 06/29/2020 1309   RBC 4.40 06/29/2020 1309   HGB 13.3 06/29/2020 1309   HCT 38.5 (L) 06/29/2020 1309   PLT 193 06/29/2020 1309   MCV 87.5 06/29/2020 1309   MCH 30.2 06/29/2020 1309   MCHC 34.5 06/29/2020 1309   RDW 13.8 06/29/2020 1309   LYMPHSABS 1.5 06/29/2020 1309   MONOABS 0.8 06/29/2020 1309   EOSABS 0.8 (H) 06/29/2020 1309   BASOSABS 0.1 06/29/2020 1309    BMET    Component Value Date/Time   NA 139 06/29/2020 1309   K 3.8 06/29/2020 1309   CL 98 06/29/2020 1309   CO2 27 06/29/2020 1309   GLUCOSE 106 (H) 06/29/2020 1309   BUN 24 (H) 06/29/2020 1309   CREATININE 1.10 06/29/2020 1309   CALCIUM 9.6 06/29/2020 1309   GFRNONAA >60 06/29/2020 1309   GFRAA >60 07/01/2019 2037    BNP    Component Value Date/Time   BNP 31.2 06/29/2020 1309    ProBNP No results found for: PROBNP  Specialty Problems       Pulmonary Problems   COPD exacerbation (HCC)   COPD with acute exacerbation (HCC)   Acute on chronic respiratory failure with hypoxia (HCC)    Allergies  Allergen  Reactions   Penicillins Other (See Comments)    From childhood- reaction not recalled   Sulfamethoxazole-Trimethoprim Swelling and Other (See Comments)    Tongue swells     There is no immunization history on file for this patient.  Past Medical History:  Diagnosis Date   CHF (congestive heart failure) (HCC)    COPD (chronic obstructive pulmonary disease) (HCC)    Hypertension    Long COVID     Tobacco History: Social History   Tobacco Use  Smoking Status Former   Years: 20.00   Types: Cigarettes  Smokeless Tobacco Former   Counseling given: Not Answered   Continue to not smoke  Outpatient Encounter Medications as of  10/29/2020  Medication Sig   acetaminophen (TYLENOL) 500 MG tablet Take 1,000 mg by mouth every 6 (six) hours as needed for mild pain or headache.   albuterol (PROVENTIL) (2.5 MG/3ML) 0.083% nebulizer solution Take 3 mLs (2.5 mg total) by nebulization every 6 (six) hours as needed for wheezing or shortness of breath.   albuterol (VENTOLIN HFA) 108 (90 Base) MCG/ACT inhaler Inhale 2 puffs into the lungs every 4 (four) hours as needed for wheezing or shortness of breath.   Aspirin-Salicylamide-Caffeine (BC HEADACHE POWDER PO) Take 2 packets by mouth daily as needed (headaches).   atorvastatin (LIPITOR) 10 MG tablet Take 10 mg by mouth at bedtime.   budesonide-formoterol (SYMBICORT) 160-4.5 MCG/ACT inhaler Inhale 2 puffs into the lungs 2 (two) times daily.   buPROPion (WELLBUTRIN XL) 300 MG 24 hr tablet Take 300 mg by mouth daily with breakfast.   FLUoxetine (PROZAC) 20 MG capsule Take 20 mg by mouth in the morning.   Ipratropium-Albuterol (COMBIVENT RESPIMAT) 20-100 MCG/ACT AERS respimat Inhale 2 puffs into the lungs daily.   losartan (COZAAR) 100 MG tablet Take 100 mg by mouth in the morning.   Multiple Vitamins-Minerals (ONE-A-DAY MENS 50+ ADVANTAGE) TABS Take 1 tablet by mouth daily with breakfast.   omeprazole (PRILOSEC) 20 MG capsule Take 20 mg by mouth daily before breakfast.   OVER THE COUNTER MEDICATION Take 1 tablet by mouth daily. Vitamin d, vitamin c, zinc combo vitamin   Oxcarbazepine (TRILEPTAL) 300 MG tablet Take 300 mg by mouth 2 (two) times daily.   potassium chloride SA (KLOR-CON) 20 MEQ tablet Take 1 tablet (20 mEq total) by mouth daily.   Psyllium (METAMUCIL PO) Take by mouth See admin instructions. Mix 1 tablespoonful of powder into 8 ounces of water and drink once a day as needed for constipation   furosemide (LASIX) 40 MG tablet Take 1 tablet (40 mg total) by mouth daily. (Patient not taking: Reported on 10/29/2020)   No facility-administered encounter medications on file as  of 10/29/2020.     Review of Systems  Review of Systems  No chest pain with exertion.  No orthopnea or PND.  No lower extremity swelling.  Comprehensive review of systems otherwise negative. Physical Exam  BP 118/80   Pulse (!) 103   Temp 97.9 F (36.6 C)   Ht 5\' 6"  (1.676 m)   Wt 184 lb (83.5 kg)   SpO2 94%   BMI 29.70 kg/m   Wt Readings from Last 5 Encounters:  10/29/20 184 lb (83.5 kg)  09/13/20 184 lb 11.9 oz (83.8 kg)  09/04/20 181 lb (82.1 kg)  08/02/20 186 lb 15.2 oz (84.8 kg)  07/19/20 183 lb 13.8 oz (83.4 kg)    BMI Readings from Last 5 Encounters:  10/29/20 29.70 kg/m  09/13/20 29.82 kg/m  09/04/20 29.21 kg/m  08/02/20 30.17 kg/m  07/19/20 29.68 kg/m     Physical Exam General: Well-appearing, no acute distress Eyes: EOMI, icterus Neck: Supple, no JVP Pulmonary: Distant, normal work of breathing, clear Cardiovascular: Regular rate and rhythm, no murmur Abdomen: Nondistended, bowel sounds present MSK: No synovitis, no joint effusion Neuro: Normal gait, no weakness Psych: Normal mood, full affect   Assessment & Plan:   COPD: Based on prior PFTs I cannot review, performed at the [place].  Gold B symptoms.  His symptoms seem well controlled on ICS/LABA therapy, to continue this.  Albuterol as needed.  Recently completed pulmonary rehab with improved symptoms.  Left hemidiaphragm elevation: Could be contributing to his dyspnea.  Has cervical pathology in the past.  Suspect this is the etiology.  Counseled no further work-up at this time.   Return in about 4 months (around 03/01/2021).   Karren Burly, MD 10/29/2020

## 2020-10-29 NOTE — Patient Instructions (Addendum)
Nice to meet you  No medication changes, continue the Symbicort as previously prescribed.  Let me know if you need refills.  I am so happy the pulmonary rehab was helpful in terms of your symptoms.  Please continue your level of exertion, exercise.  This is the best thing to help keep your breathing healthy and your lungs working well.  I will try to track down the results of the breathing test from earlier in 2022, thank you for filling out the release of information paperwork.  Return to clinic in 4 months or sooner as needed with Dr. Judeth Horn

## 2021-02-01 ENCOUNTER — Telehealth: Payer: Self-pay | Admitting: Pulmonary Disease

## 2021-02-01 NOTE — Telephone Encounter (Signed)
Printed off office notes and will fax to [place]. Nothing else needed at this time

## 2021-03-26 ENCOUNTER — Other Ambulatory Visit: Payer: Self-pay

## 2021-03-26 ENCOUNTER — Ambulatory Visit (INDEPENDENT_AMBULATORY_CARE_PROVIDER_SITE_OTHER): Payer: No Typology Code available for payment source | Admitting: Pulmonary Disease

## 2021-03-26 ENCOUNTER — Encounter: Payer: Self-pay | Admitting: Pulmonary Disease

## 2021-03-26 VITALS — BP 126/68 | HR 103 | Temp 98.7°F | Ht 66.0 in | Wt 193.0 lb

## 2021-03-26 DIAGNOSIS — J439 Emphysema, unspecified: Secondary | ICD-10-CM

## 2021-03-26 DIAGNOSIS — R0609 Other forms of dyspnea: Secondary | ICD-10-CM | POA: Diagnosis not present

## 2021-03-26 NOTE — Patient Instructions (Signed)
Nice to see you again ? ?Contact your doctor to see what is going on with the referral to the kidney doctors, I do think getting off some of the extra fluid or swelling may help with the breathing ? ?I provided samples of Breztri, 2 puffs twice a day every day.  This is similar to Symbicort but adds a third medicine to help with the shortness of breath.  There should be enough for 2 weeks.  Please see me a message in a week or 2 if you feel like it is helping and I will send a prescription in.  If it is too expensive let me know and we will find a solution. ? ?Return to clinic in 3 months or sooner as needed with Dr. Judeth Horn ?

## 2021-03-26 NOTE — Progress Notes (Signed)
? ?@Patient  ID: Tanner Chang, male    DOB: 11/22/1955, 66 y.o.   MRN: [id number] ? ?Chief Complaint  ?Patient presents with  ? Follow-up  ?  Here for follow up. Received PFT from [place] today   ? ? ?Referring provider: ?[facility] ? ?HPI:  ? ?66 y.o. with history of COPD based on PFTs at the [place] whom are seeing in follow-up for evaluation of COPD.   ? ?Doing okay.  Seems like dyspnea worse over the last few months.  Notably had improved dyspnea after completing pulmonary rehab.  Suspect more effective.  In addition, he has worsening swelling.  Lower extremity swelling bilaterally.  He was told by his primary doctor he has a "stiff heart."  In addition he was told that his kidney function was not as good as it was a few months ago on recent labs.  This is prompted a referral to the nephrologist per his report. ? ?HPI at initial visit: ?Overall, patient doing well.  He recently completed pulmonary rehab.  He states this helped tremendously with his breathing.  He has baseline dyspnea but this is improved.  Worse on inclines or stairs.  No time of day where things are better or worse.  No position make things better or worse.  No seasonal environmental factors she can identify things better or worse.  Uses Symbicort.  He finds is effective.  Use albuterol as needed and finds it effective as well.  He reports infrequent use of albuterol. ? ?Reviewed most recent chest x-ray 06/29/2020 that shows left hemidiaphragm elevation on my interpretation.  Reviewed most recent CT chest 05/24/2020 that on my review and interpretation shows emphysema, left hemidiaphragm elevation with volume loss on the left.  Most recent TTE 02/2020 reviewed this demonstrates diastolic dysfunction, grade 1, normal LA size, normal RV size and function. ? ?PMH: Tobacco abuse in remission, hypertension ?Surgical history: Back surgery ?Family history:No family history on file. ?Social history: Former smoker, quit some years ago, lives in  [place] ? ?Questionaires / Pulmonary Flowsheets:  ? ?ACT:  ?No flowsheet data found. ? ?MMRC: ?No flowsheet data found. ? ?Epworth:  ?No flowsheet data found. ? ?Tests:  ? ?FENO:  ?No results found for: NITRICOXIDE ? ?PFT: ?No flowsheet data found. ? ?WALK:  ?SIX MIN WALK 09/13/2020 07/09/2020  ?2 Minute Oxygen Saturation % 93 93  ?2 Minute HR 117 139  ?4 Minute Oxygen Saturation % 94 92  ?4 Minute HR 128 127  ?6 Minute Oxygen Saturation % 93 92  ?6 Minute HR 129 123  ?Prior walks demonstrates good heart response, no desaturation requiring oxygen on my review interpretation ? ?Imaging: ? ?Personally reviewed and as per EMR discussion this note ?Lab Results: ?Personally reviewed ?CBC ?   ?Component Value Date/Time  ? WBC 7.3 06/29/2020 1309  ? RBC 4.40 06/29/2020 1309  ? HGB 13.3 06/29/2020 1309  ? HCT 38.5 (L) 06/29/2020 1309  ? PLT 193 06/29/2020 1309  ? MCV 87.5 06/29/2020 1309  ? MCH 30.2 06/29/2020 1309  ? MCHC 34.5 06/29/2020 1309  ? RDW 13.8 06/29/2020 1309  ? LYMPHSABS 1.5 06/29/2020 1309  ? MONOABS 0.8 06/29/2020 1309  ? EOSABS 0.8 (H) 06/29/2020 1309  ? BASOSABS 0.1 06/29/2020 1309  ? ? ?BMET ?   ?Component Value Date/Time  ? NA 139 06/29/2020 1309  ? K 3.8 06/29/2020 1309  ? CL 98 06/29/2020 1309  ? CO2 27 06/29/2020 1309  ? GLUCOSE 106 (H) 06/29/2020 1309  ?  BUN 24 (H) 06/29/2020 1309  ? CREATININE 1.10 06/29/2020 1309  ? CALCIUM 9.6 06/29/2020 1309  ? GFRNONAA >60 06/29/2020 1309  ? GFRAA >60 07/01/2019 2037  ? ? ?BNP ?   ?Component Value Date/Time  ? BNP 31.2 06/29/2020 1309  ? ? ?ProBNP ?No results found for: PROBNP ? ?Specialty Problems   ? ?  ? Pulmonary Problems  ? COPD exacerbation ([place])  ? COPD with acute exacerbation ([place])  ? Acute on chronic respiratory failure with hypoxia ([facility])  ? ? ?Allergies  ?Allergen Reactions  ? Penicillins Other (See Comments)  ?  From childhood- reaction not recalled  ? Sulfamethoxazole-Trimethoprim Swelling and Other (See Comments)  ?  Tongue swells  ? ? ? ?There is no  immunization history on file for this patient. ? ?Past Medical History:  ?Diagnosis Date  ? CHF (congestive heart failure) ([place])   ? COPD (chronic obstructive pulmonary disease) ([place])   ? Hypertension   ? Long COVID   ? ? ?Tobacco History: ?Social History  ? ?Tobacco Use  ?Smoking Status Former  ? Years: 20.00  ? Types: Cigarettes  ?Smokeless Tobacco Former  ? ?Counseling given: Not Answered ? ? ?Continue to not smoke ? ?Outpatient Encounter Medications as of 03/26/2021  ?Medication Sig  ? acetaminophen (TYLENOL) 500 MG tablet Take 1,000 mg by mouth every 6 (six) hours as needed for mild pain or headache.  ? albuterol (PROVENTIL) (2.5 MG/3ML) 0.083% nebulizer solution Take 3 mLs (2.5 mg total) by nebulization every 6 (six) hours as needed for wheezing or shortness of breath.  ? albuterol (VENTOLIN HFA) 108 (90 Base) MCG/ACT inhaler Inhale 2 puffs into the lungs every 4 (four) hours as needed for wheezing or shortness of breath.  ? Aspirin-Salicylamide-Caffeine (BC HEADACHE POWDER PO) Take 2 packets by mouth daily as needed (headaches).  ? atorvastatin (LIPITOR) 10 MG tablet Take 10 mg by mouth at bedtime.  ? budesonide-formoterol (SYMBICORT) 160-4.5 MCG/ACT inhaler Inhale 2 puffs into the lungs 2 (two) times daily.  ? buPROPion (WELLBUTRIN XL) 300 MG 24 hr tablet Take 300 mg by mouth daily with breakfast.  ? FLUoxetine (PROZAC) 20 MG capsule Take 20 mg by mouth in the morning.  ? furosemide (LASIX) 40 MG tablet Take 1 tablet (40 mg total) by mouth daily.  ? Ipratropium-Albuterol (COMBIVENT RESPIMAT) 20-100 MCG/ACT AERS respimat Inhale 2 puffs into the lungs daily.  ? losartan (COZAAR) 100 MG tablet Take 100 mg by mouth in the morning.  ? Multiple Vitamins-Minerals (ONE-A-DAY MENS 50+ ADVANTAGE) TABS Take 1 tablet by mouth daily with breakfast.  ? omeprazole (PRILOSEC) 20 MG capsule Take 20 mg by mouth daily before breakfast.  ? OVER THE COUNTER MEDICATION Take 1 tablet by mouth daily. Vitamin d, vitamin c, zinc combo  vitamin  ? Oxcarbazepine (TRILEPTAL) 300 MG tablet Take 300 mg by mouth 2 (two) times daily.  ? potassium chloride SA (KLOR-CON) 20 MEQ tablet Take 1 tablet (20 mEq total) by mouth daily.  ? Psyllium (METAMUCIL PO) Take by mouth See admin instructions. Mix 1 tablespoonful of powder into 8 ounces of water and drink once a day as needed for constipation  ? ?No facility-administered encounter medications on file as of 03/26/2021.  ? ? ? ?Review of Systems ? ?Review of Systems  ?N/a ?Physical Exam ? ?BP 126/68 (BP Location: Left Arm, Patient Position: Sitting, Cuff Size: Normal)   Pulse (!) 103   Temp 98.7 ?F (37.1 ?C) (Oral)   Ht 5\' 6"  (1.676 m)  Wt 193 lb (87.5 kg)   SpO2 97%   BMI 31.15 kg/m?  ? ?Wt Readings from Last 5 Encounters:  ?03/26/21 193 lb (87.5 kg)  ?10/29/20 184 lb (83.5 kg)  ?09/13/20 184 lb 11.9 oz (83.8 kg)  ?09/04/20 181 lb (82.1 kg)  ?08/02/20 186 lb 15.2 oz (84.8 kg)  ? ? ?BMI Readings from Last 5 Encounters:  ?03/26/21 31.15 kg/m?  ?10/29/20 29.70 kg/m?  ?09/13/20 29.82 kg/m?  ?09/04/20 29.21 kg/m?  ?08/02/20 30.17 kg/m?  ? ? ? ?Physical Exam ?General: Well-appearing, no acute distress ?Eyes: EOMI, icterus ?Neck: Supple, no JVP ?Pulmonary: Distant, normal work of breathing, clear ?Cardiovascular: Regular rate and rhythm, no murmur, extremities warm, pitting edema bilaterally with chronic venous stasis changes ?Abdomen: Nondistended, bowel sounds present ?MSK: No synovitis, no joint effusion ?Neuro: Normal gait, no weakness ?Psych: Normal mood, full affect ? ? ?Assessment & Plan:  ? ?COPD: Based on prior PFTs performed at [facility], reviewed 03/26/2021 with ratio right at lower limit of normal, moderate reduced DLCO, gas trapping with elevated RV/TLC ratio.  A bit worsening symptoms over time.  The setting of fluid retention, do suspect could be related to congestive heart failure as he reports diastolic dysfunction.  Hard to assess orthopnea or PND as low back pain makes him sleep in the recliner  most nights.  Escalate Symbicort to [facility] for triple inhaled therapy.  Has referral to nephrologist which I encouraged him to keep for further recommendations regarding Lasix or other treatment of heart failure as w

## 2021-05-28 ENCOUNTER — Telehealth (HOSPITAL_COMMUNITY): Payer: Self-pay

## 2021-05-28 NOTE — Telephone Encounter (Signed)
Pt is covered thru the [place], [place] [id number]. ?

## 2021-05-28 NOTE — Telephone Encounter (Signed)
Called and spoke with pt in regards to [facility], pt stated he is going out of town tomorrow for a few weeks. He will contact [facility] for scheduling when he returns. ?

## 2021-06-18 ENCOUNTER — Encounter (HOSPITAL_COMMUNITY): Payer: Self-pay

## 2021-06-18 ENCOUNTER — Telehealth (HOSPITAL_COMMUNITY): Payer: Self-pay

## 2021-06-18 NOTE — Telephone Encounter (Signed)
Attempted to call patient in regards to Pulmonary Rehab - unable to leave VM, VM box not set up.   Mailed letter 

## 2021-06-26 ENCOUNTER — Ambulatory Visit: Payer: No Typology Code available for payment source | Admitting: Pulmonary Disease

## 2021-07-03 ENCOUNTER — Ambulatory Visit: Payer: No Typology Code available for payment source | Admitting: Pulmonary Disease

## 2021-07-10 ENCOUNTER — Encounter: Payer: Self-pay | Admitting: Pulmonary Disease

## 2021-07-10 ENCOUNTER — Ambulatory Visit (INDEPENDENT_AMBULATORY_CARE_PROVIDER_SITE_OTHER): Payer: No Typology Code available for payment source | Admitting: Pulmonary Disease

## 2021-07-10 VITALS — BP 110/78 | HR 95 | Temp 98.6°F | Ht 66.5 in | Wt 194.8 lb

## 2021-07-10 DIAGNOSIS — J449 Chronic obstructive pulmonary disease, unspecified: Secondary | ICD-10-CM | POA: Diagnosis not present

## 2021-07-10 MED ORDER — TRELEGY ELLIPTA 200-62.5-25 MCG/ACT IN AEPB: 1.0000 | INHALATION_SPRAY | Freq: Every day | RESPIRATORY_TRACT | 0 refills | Status: DC

## 2021-07-10 NOTE — Progress Notes (Signed)
@Patient  ID: , male    DOB: October 30, 1955, 66 y.o.   MRN: [id number]  Chief Complaint  Patient presents with   Follow-up    Pt states his breathing is about the same from the last visit    Referring provider: [facility]  HPI:   66 y.o. with history of COPD based on PFTs at the 66 whom are seeing in follow-up for evaluation of COPD.    Doing okay.  Use Breztri for a while.  Did not think was very helpful.  Indicates is currently using Stiolto.  Using what sounds like Flovent as needed although I do wonder if he remains albuterol and is just confused by the picture on the wall.  The most the time in [place].  Got the report from the kidney doctor.  His heart doctor is decreasing medicines over time.  Sounds like hopefully will be able to decrease and come off Lasix in the near future per his report.  HPI at initial visit: Overall, patient doing well.  He recently completed pulmonary rehab.  He states this helped tremendously with his breathing.  He has baseline dyspnea but this is improved.  Worse on inclines or stairs.  No time of day where things are better or worse.  No position make things better or worse.  No seasonal environmental factors she can identify things better or worse.  Uses Symbicort.  He finds is effective.  Use albuterol as needed and finds it effective as well.  He reports infrequent use of albuterol.  Reviewed most recent chest x-ray 06/29/2020 that shows left hemidiaphragm elevation on my interpretation.  Reviewed most recent CT chest 05/24/2020 that on my review and interpretation shows emphysema, left hemidiaphragm elevation with volume loss on the left.  Most recent TTE 02/2020 reviewed this demonstrates diastolic dysfunction, grade 1, normal LA size, normal RV size and function.  PMH: Tobacco abuse in remission, hypertension Surgical history: Back surgery Family history:History reviewed. No pertinent family history. Social history: Former smoker,  quit some years ago, lives in [place] / Pulmonary Flowsheets:   ACT:      No data to display          MMRC:     No data to display          Epworth:      No data to display          Tests:   FENO:  No results found for: "NITRICOXIDE"  PFT:     No data to display          WALK:     09/13/2020   11:58 AM 07/09/2020   11:30 AM  SIX MIN WALK  2 Minute Oxygen Saturation % 93 % 93 %  2 Minute HR 117 139  4 Minute Oxygen Saturation % 94 % 92 %  4 Minute HR 128 127  6 Minute Oxygen Saturation % 93 % 92 %  6 Minute HR 129 123  Prior walks demonstrates good heart response, no desaturation requiring oxygen on my review interpretation  Imaging:  Personally reviewed and as per EMR discussion this note Lab Results: Personally reviewed CBC    Component Value Date/Time   WBC 7.3 06/29/2020 1309   RBC 4.40 06/29/2020 1309   HGB 13.3 06/29/2020 1309   HCT 38.5 (L) 06/29/2020 1309   PLT 193 06/29/2020 1309   MCV 87.5 06/29/2020 1309   MCH 30.2 06/29/2020 1309   MCHC 34.5 06/29/2020  1309   RDW 13.8 06/29/2020 1309   LYMPHSABS 1.5 06/29/2020 1309   MONOABS 0.8 06/29/2020 1309   EOSABS 0.8 (H) 06/29/2020 1309   BASOSABS 0.1 06/29/2020 1309    BMET    Component Value Date/Time   NA 139 06/29/2020 1309   K 3.8 06/29/2020 1309   CL 98 06/29/2020 1309   CO2 27 06/29/2020 1309   GLUCOSE 106 (H) 06/29/2020 1309   BUN 24 (H) 06/29/2020 1309   CREATININE 1.10 06/29/2020 1309   CALCIUM 9.6 06/29/2020 1309   GFRNONAA >60 06/29/2020 1309   GFRAA >60 07/01/2019 2037    BNP    Component Value Date/Time   BNP 31.2 06/29/2020 1309    ProBNP No results found for: "PROBNP"  Specialty Problems       Pulmonary Problems   COPD exacerbation ([place])   COPD with acute exacerbation ([facility])   Acute on chronic respiratory failure with hypoxia (HCC)    Allergies  Allergen Reactions   Penicillins Other (See Comments)    From childhood-  reaction not recalled     There is no immunization history on file for this patient.  Past Medical History:  Diagnosis Date   CHF (congestive heart failure) (H[facility])    COPD (chronic obstructive pulmonary disease) ([place])    Hypertension    Long COVID     Tobacco History: Social History   Tobacco Use  Smoking Status Former   Packs/day: 2.00   Years: 20.00   Total pack years: 40.00   Types: Cigarettes   Quit date: 06/2018   Years since quitting: 3.0   Passive exposure: Past  Smokeless Tobacco Former   Counseling given: Not Answered   Continue to not smoke  Outpatient Encounter Medications as of 07/10/2021  Medication Sig   acetaminophen (TYLENOL) 500 MG tablet Take 1,000 mg by mouth every 6 (six) hours as needed for mild pain or headache.   albuterol (PROVENTIL) (2.5 MG/3ML) 0.083% nebulizer solution Take 3 mLs (2.5 mg total) by nebulization every 6 (six) hours as needed for wheezing or shortness of breath.   albuterol (VENTOLIN HFA) 108 (90 Base) MCG/ACT inhaler Inhale 2 puffs into the lungs every 4 (four) hours as needed for wheezing or shortness of breath.   Aspirin-Salicylamide-Caffeine (BC HEADACHE POWDER PO) Take 2 packets by mouth daily as needed (headaches).   atorvastatin (LIPITOR) 10 MG tablet Take 10 mg by mouth at bedtime.   budesonide-formoterol (SYMBICORT) 160-4.5 MCG/ACT inhaler Inhale 2 puffs into the lungs 2 (two) times daily.   buPROPion (WELLBUTRIN XL) 300 MG 24 hr tablet Take 300 mg by mouth daily with breakfast.   FLUoxetine (PROZAC) 20 MG capsule Take 20 mg by mouth in the morning.   furosemide (LASIX) 40 MG tablet Take 1 tablet (40 mg total) by mouth daily.   Ipratropium-Albuterol (COMBIVENT RESPIMAT) 20-100 MCG/ACT AERS respimat Inhale 2 puffs into the lungs daily.   losartan (COZAAR) 100 MG tablet Take 100 mg by mouth in the morning.   Multiple Vitamins-Minerals (ONE-A-DAY MENS 50+ ADVANTAGE) TABS Take 1 tablet by mouth daily with breakfast.    omeprazole (PRILOSEC) 20 MG capsule Take 20 mg by mouth daily before breakfast.   OVER THE COUNTER MEDICATION Take 1 tablet by mouth daily. Vitamin d, vitamin c, zinc combo vitamin   Oxcarbazepine (TRILEPTAL) 300 MG tablet Take 300 mg by mouth 2 (two) times daily.   potassium chloride SA (KLOR-CON) 20 MEQ tablet Take 1 tablet (20 mEq total) by mouth daily.  Psyllium (METAMUCIL PO) Take by mouth See admin instructions. Mix 1 tablespoonful of powder into 8 ounces of water and drink once a day as needed for constipation   No facility-administered encounter medications on file as of 07/10/2021.     Review of Systems  Review of Systems  N/a Physical Exam  BP 110/78 (BP Location: Right Arm, Patient Position: Sitting, Cuff Size: Normal)   Pulse 95   Temp 98.6 F (37 C) (Oral)   Ht 5' 6.5" (1.689 m)   Wt 194 lb 12.8 oz (88.4 kg)   SpO2 95%   BMI 30.97 kg/m   Wt Readings from Last 5 Encounters:  07/10/21 194 lb 12.8 oz (88.4 kg)  03/26/21 193 lb (87.5 kg)  10/29/20 184 lb (83.5 kg)  09/13/20 184 lb 11.9 oz (83.8 kg)  09/04/20 181 lb (82.1 kg)    BMI Readings from Last 5 Encounters:  07/10/21 30.97 kg/m  03/26/21 31.15 kg/m  10/29/20 29.70 kg/m  09/13/20 29.82 kg/m  09/04/20 29.21 kg/m     Physical Exam General: Well-appearing, no acute distress Eyes: EOMI, icterus Neck: Supple, no JVP Pulmonary: Distant, normal work of breathing, clear Cardiovascular: Regular rate and rhythm, no murmur, extremities warm, pitting edema bilaterally with chronic venous stasis changes Abdomen: Nondistended, bowel sounds present MSK: No synovitis, no joint effusion Neuro: Normal gait, no weakness Psych: Normal mood, full affect   Assessment & Plan:   COPD: Based on prior PFTs performed at [facility], reviewed 03/26/2021 with ratio right at lower limit of normal, moderate reduced DLCO, gas trapping with elevated RV/TLC ratio.  A bit worsening symptoms over time.  The setting of fluid retention,  do suspect could be related to congestive heart failure as he reports diastolic dysfunction.  Hard to assess orthopnea or PND as low back pain makes him sleep in the recliner most nights.  Increased to [place] at last visit.  Did not seem to help.  Currently on Stiolto.  Trial Trelegy for triple inhaled therapy.  If not beneficial, will stick with Stiolto.  Left hemidiaphragm elevation: Could be contributing to his dyspnea.  Has cervical pathology in the past.  Suspect this is the etiology.  Counseled no further work-up at this time.   Return in about 6 months (around 01/09/2022).   Karren Burly, MD 07/10/2021

## 2021-07-10 NOTE — Patient Instructions (Addendum)
Lets try new inhaler, Trelegy 1 puff once a day.  Rinse her mouth out after every use.  This replaces the Stiolto or green inhaler you pointed out.  If the Trelegy is helpful, please let me know and I will send a prescription to the [place].  If the Trelegy is not helpful, please let me know and we will plan to resume Stiolto (green inhaler).  Return to clinic in 6 months or sooner as needed with Dr. Judeth Horn

## 2021-09-17 ENCOUNTER — Encounter: Payer: Self-pay | Admitting: Pulmonary Disease

## 2021-09-19 MED ORDER — TRELEGY ELLIPTA 200-62.5-25 MCG/ACT IN AEPB: 1.0000 | INHALATION_SPRAY | Freq: Every day | RESPIRATORY_TRACT | 3 refills | Status: DC

## 2021-10-01 ENCOUNTER — Other Ambulatory Visit: Payer: Self-pay

## 2021-10-01 MED ORDER — FLUTICASONE-SALMETEROL 500-50 MCG/ACT IN AEPB: 1.0000 | INHALATION_SPRAY | Freq: Two times a day (BID) | RESPIRATORY_TRACT | 4 refills | Status: AC

## 2021-10-01 MED ORDER — SPIRIVA RESPIMAT 2.5 MCG/ACT IN AERS: 2.0000 | INHALATION_SPRAY | Freq: Every day | RESPIRATORY_TRACT | 4 refills | Status: AC

## 2021-12-14 IMAGING — CT CT CHEST W/O CM
2 of 4 series · 15 of 36 positions shown, 18 images · non-contrast
Comparison: Chest x-ray 05/24/2020, CT chest 12/14/2019

CLINICAL DATA: Nondiagnostic chest x-ray with cough and shortness
of breath

EXAM:
CT CHEST WITHOUT CONTRAST
TECHNIQUE: Multidetector CT imaging of the chest was performed following the
standard protocol without IV contrast.

[Series 2: routine chest without · axial · non-contrast · 0.81mm/px · z∈[-709,-451]mm · 12 of 151 slices shown, 15 images]
[im 11/151  mediastinal]
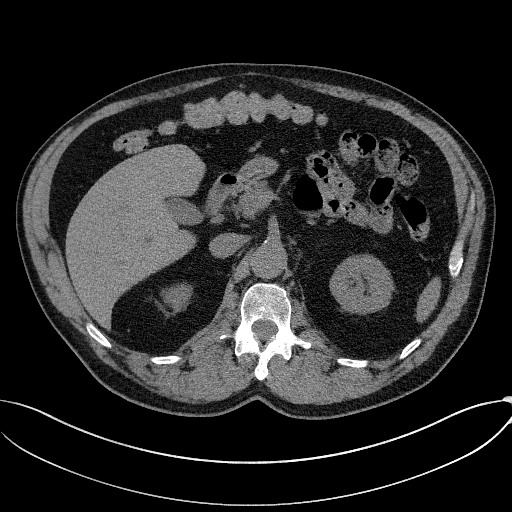
[im 11/151  lung]
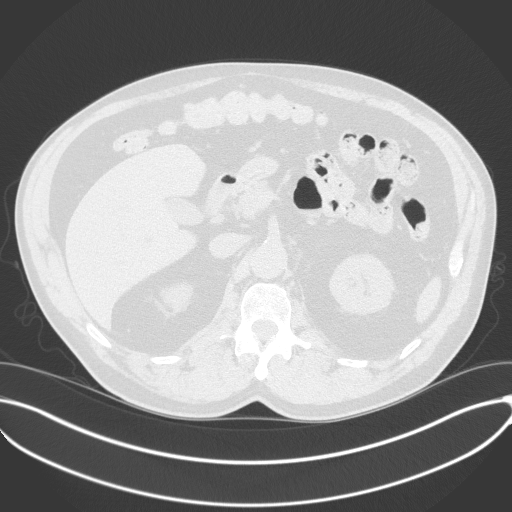
[im 22/151  lung]
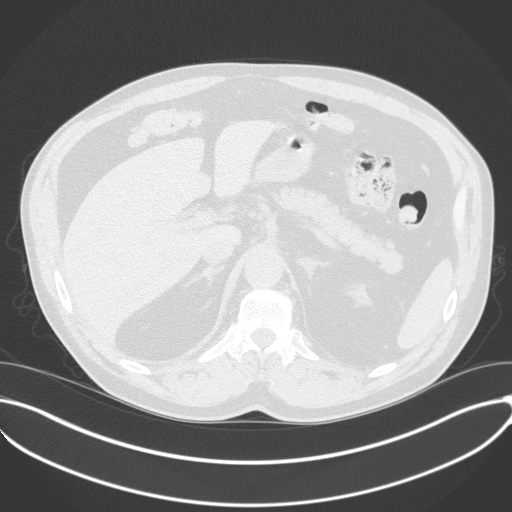
[im 33/151  lung]
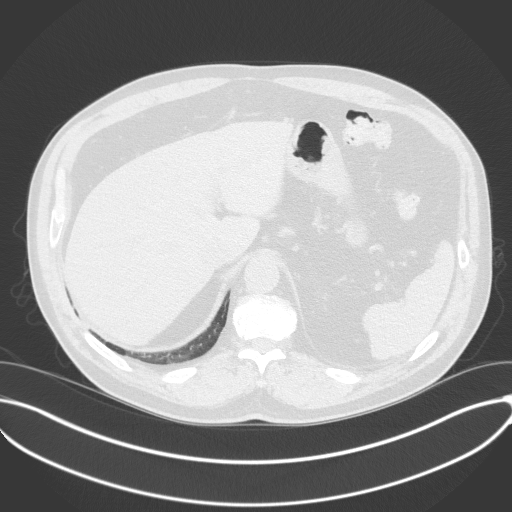
[im 43/151  lung]
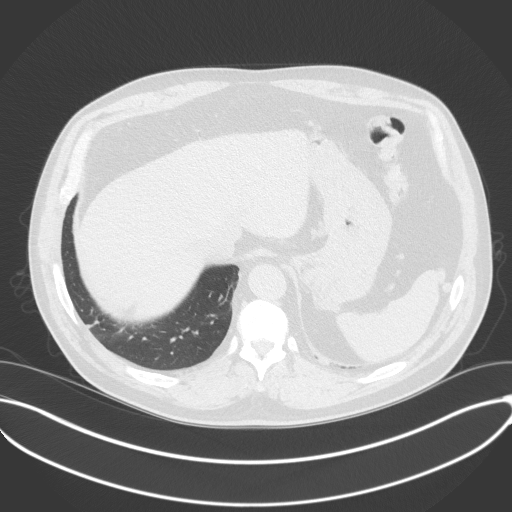
[im 54/151  mediastinal]
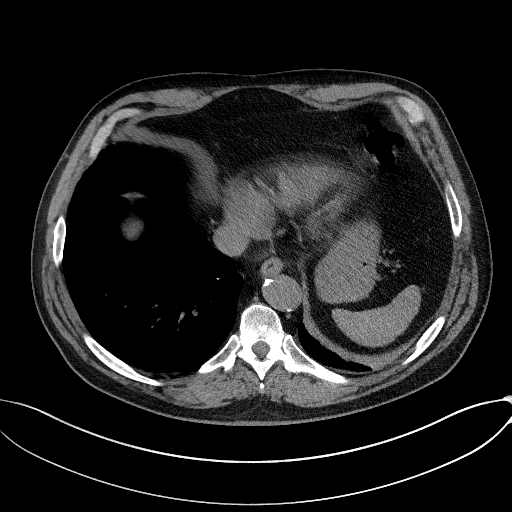
[im 54/151  lung]
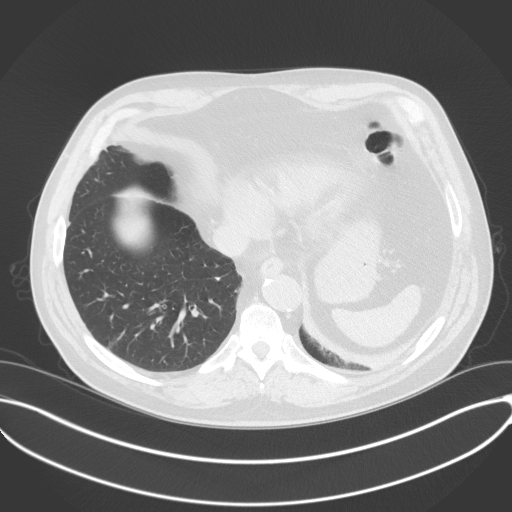
[im 65/151  lung]
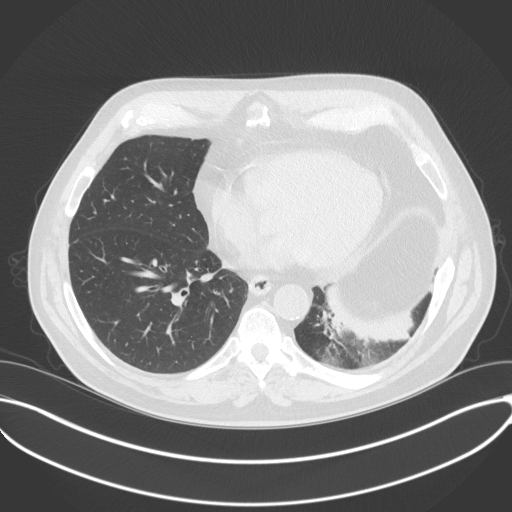
[im 86/151  lung]
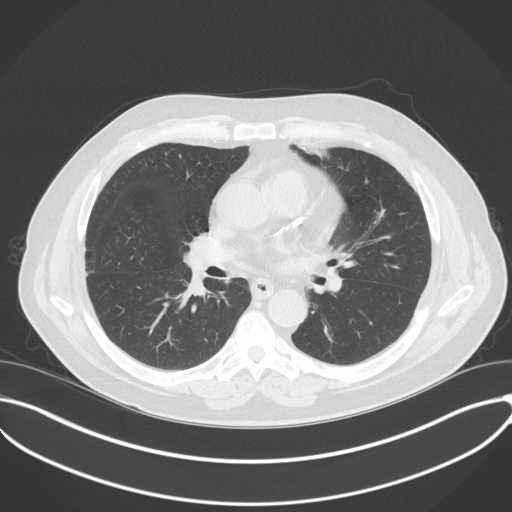
[im 97/151  lung]
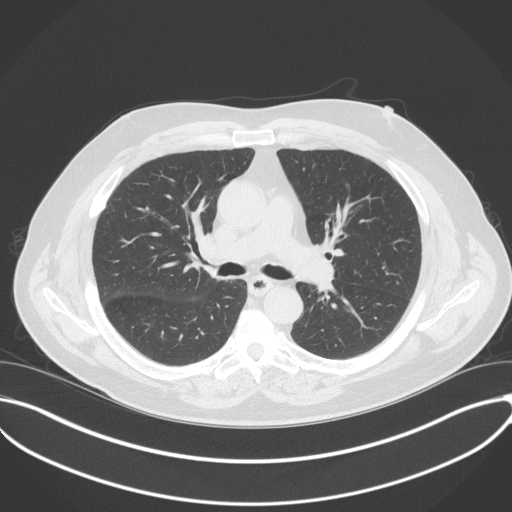
[im 108/151  mediastinal]
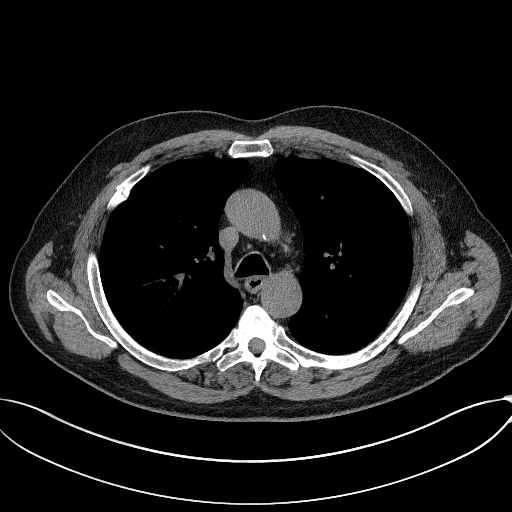
[im 108/151  lung]
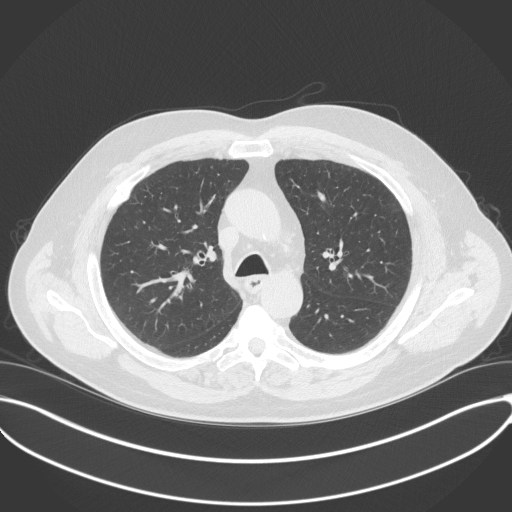
[im 118/151  lung]
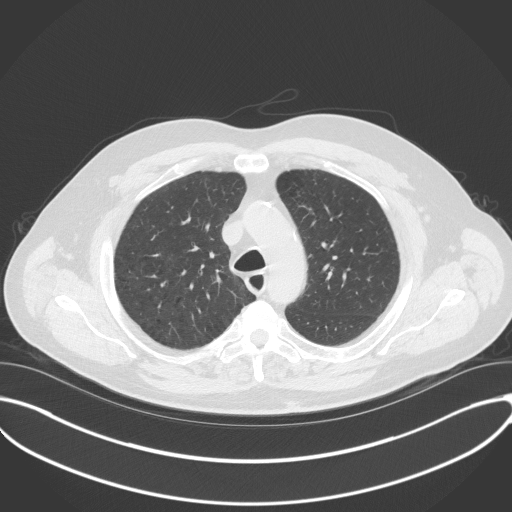
[im 129/151  lung]
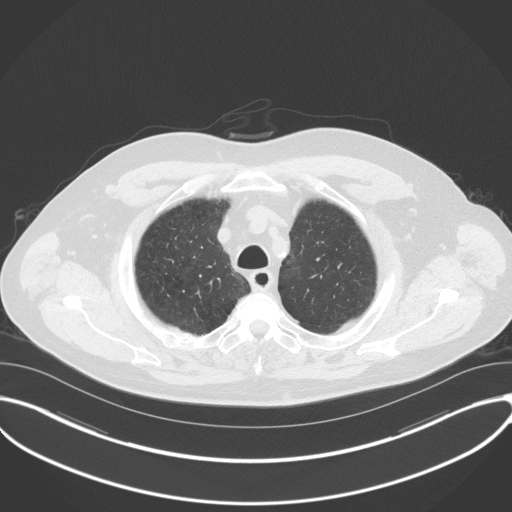
[im 140/151  lung]
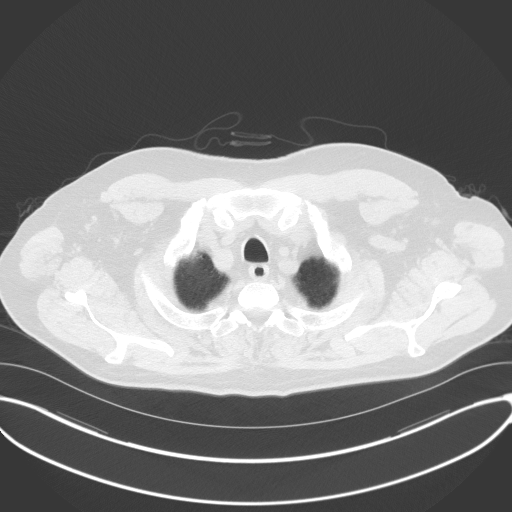

[Series 5: coronal · coronal · 0.61mm/px · 3 of 143 slices shown]
[im 29/143  lung]
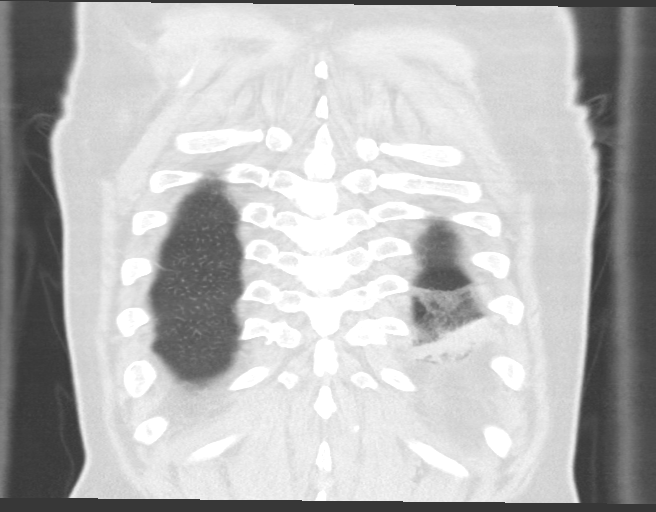
[im 57/143  lung]
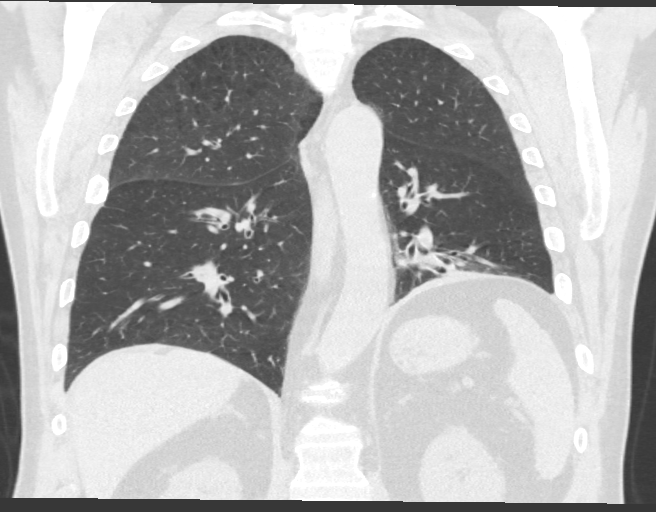
[im 86/143  lung]
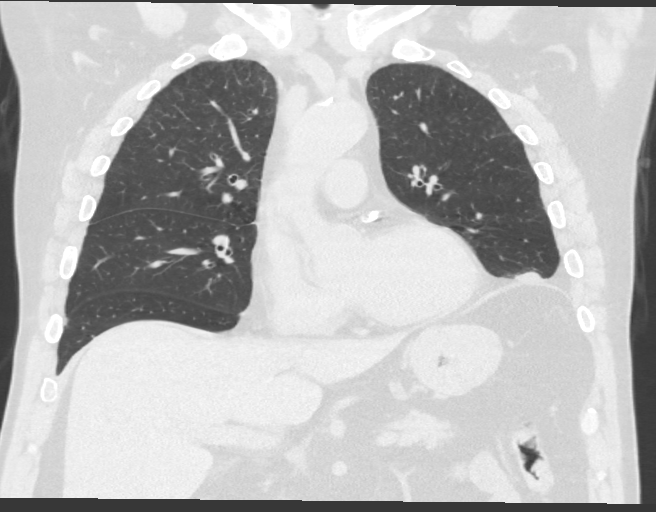

[15 of 36 positions shown; findings below may reference images not displayed]

FINDINGS: Cardiovascular: Limited evaluation without intravenous contrast.
Mild aortic atherosclerosis. No aneurysm. Coronary vascular
calcification. Normal cardiac size. No pericardial effusion

Mediastinum/Nodes: Midline trachea. No thyroid mass. No suspicious
adenopathy. Esophagus unremarkable

Lungs/Pleura: Mild emphysema. No acute consolidation, pleural
effusion or pneumothorax. Chronic elevation of left diaphragm with
partial atelectasis in the left lower lobe

Upper Abdomen: No acute abnormality

Musculoskeletal: Subacute to chronic appearing right fourth and
fifth anterolateral rib fractures corresponding to radiographic
abnormality. Chronic right lateral sixth through ninth rib
fractures.
IMPRESSION: 1. Nodular right lung opacities correspond to subacute to chronic
right fourth and fifth anterolateral rib fractures.
2. There is no acute airspace disease.
3. Mild emphysema
4. Chronic elevation left diaphragm with partial atelectasis at the
left base

Aortic Atherosclerosis (6DQBA-4V0.0) and Emphysema (6DQBA-LE9.8).

## 2021-12-14 IMAGING — DX DG CHEST 2V
2 series · 2 of 2 positions shown · non-contrast
Comparison: 05/02/2020.  Chest CT dated 12/14/2019.

CLINICAL DATA: Cough and chest congestion. Shortness of breath.
Ex-smoker.

EXAM:
CHEST - 2 VIEW

[chest pa]
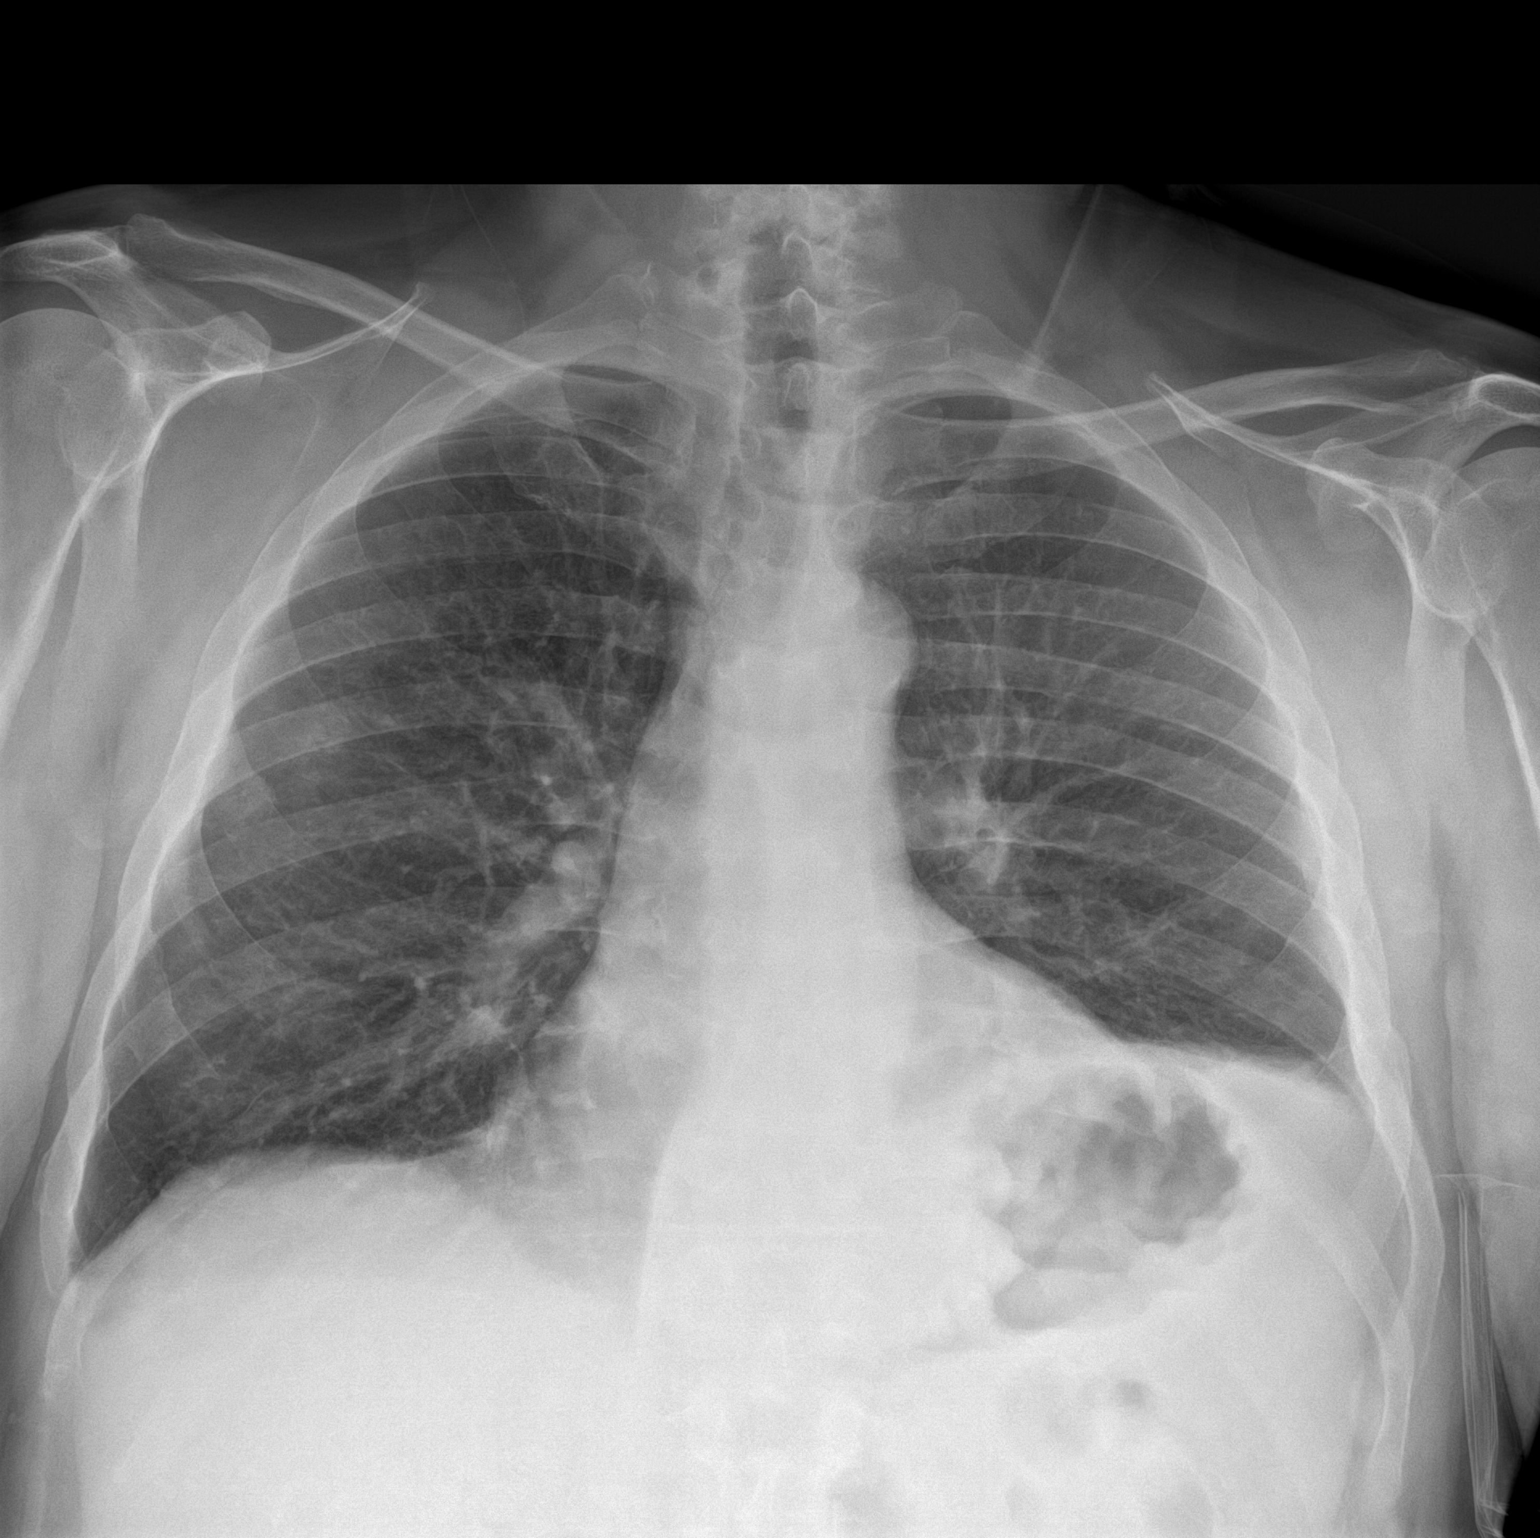

[chest lat]
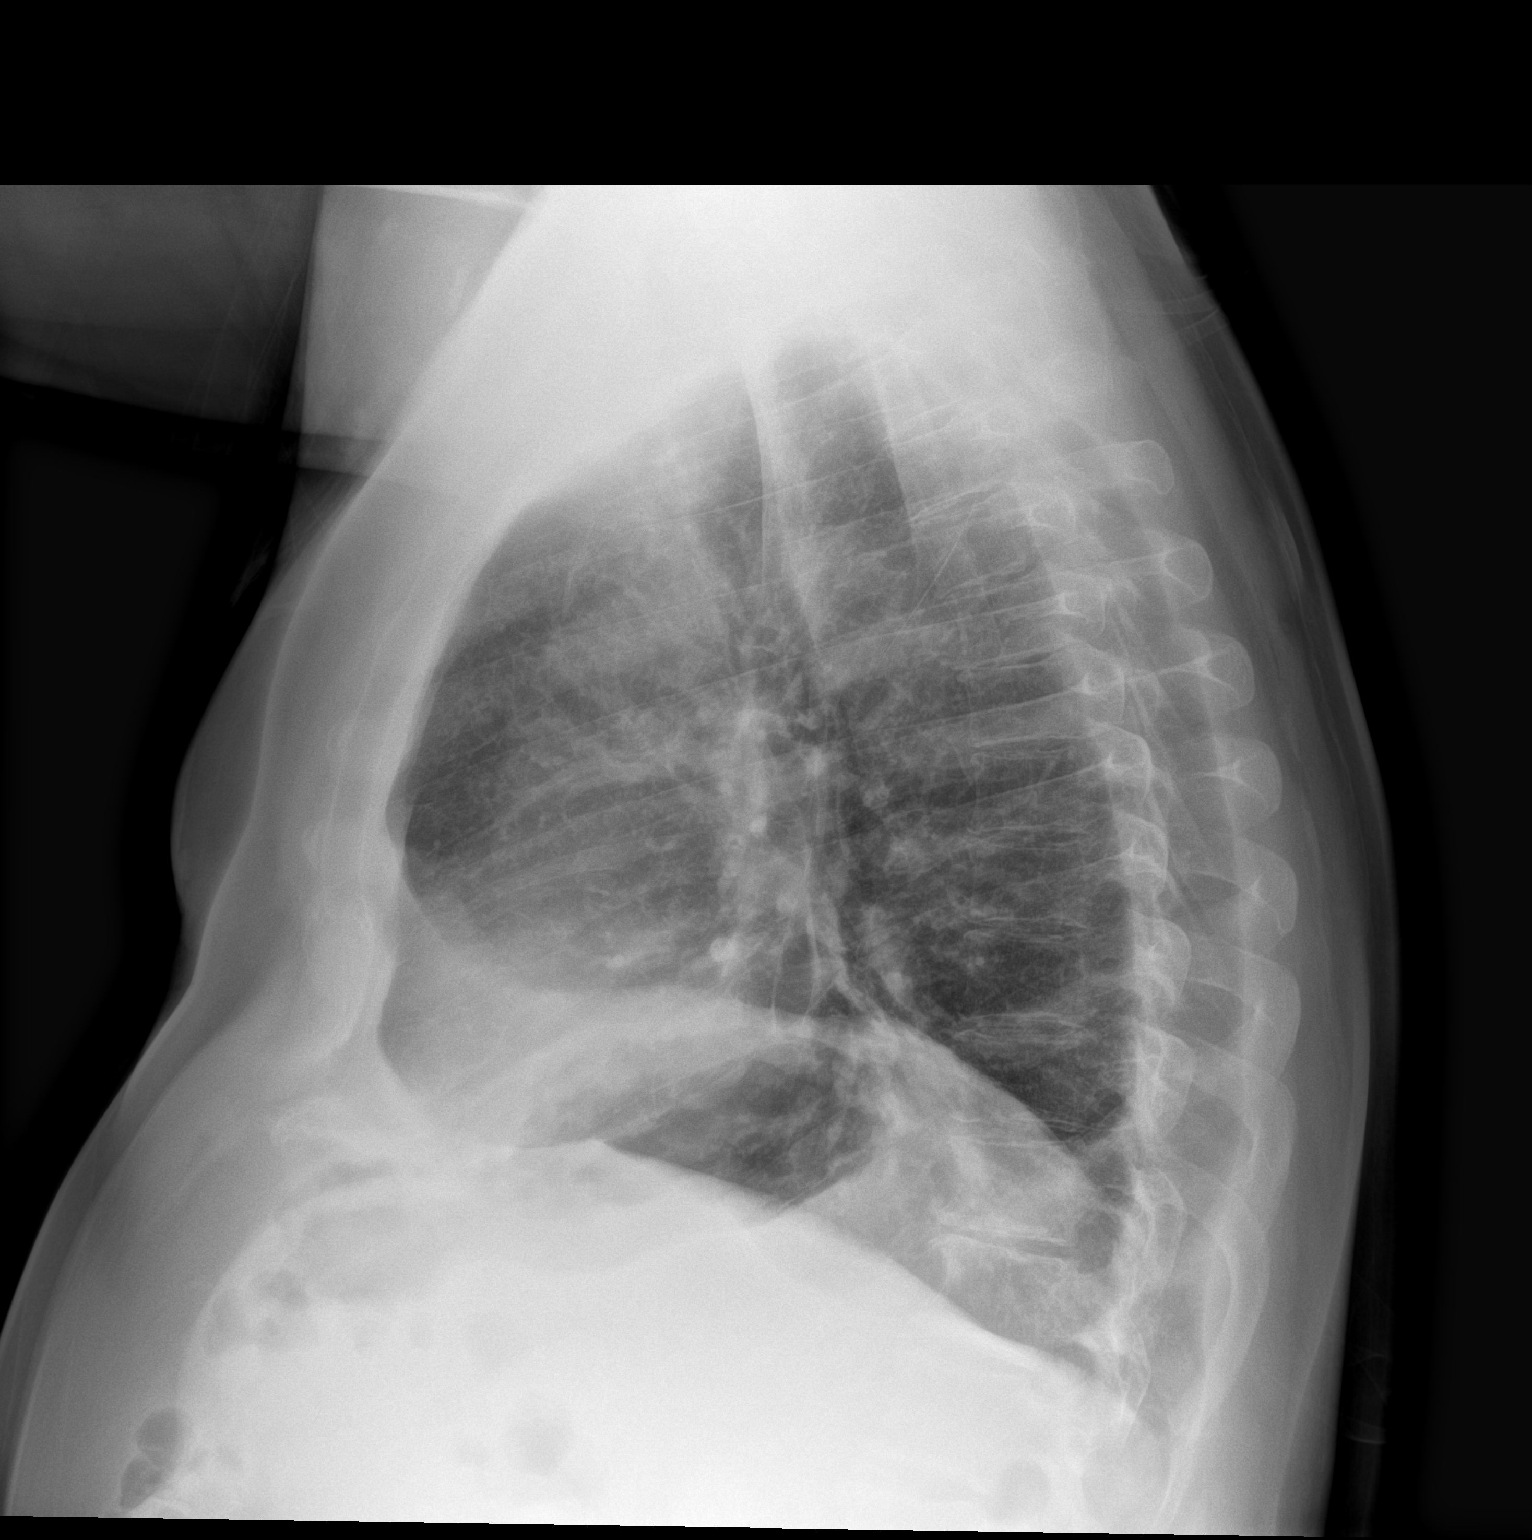

[2 of 2 positions shown; findings below may reference images not displayed]

FINDINGS: Normal sized heart. Stable elevated left hemidiaphragm. Stable they
get nodular densities in the lateral aspect of the right mid lung
zone. Unremarkable bones.
IMPRESSION: Persistent vague nodular densities overlying the lateral aspect of
the right mid lung zone. Further evaluation with chest CT is
recommended.

## 2022-01-20 ENCOUNTER — Ambulatory Visit: Payer: No Typology Code available for payment source | Admitting: Pulmonary Disease

## 2023-05-05 ENCOUNTER — Emergency Department (HOSPITAL_BASED_OUTPATIENT_CLINIC_OR_DEPARTMENT_OTHER)

## 2023-05-05 ENCOUNTER — Emergency Department (HOSPITAL_BASED_OUTPATIENT_CLINIC_OR_DEPARTMENT_OTHER)
Admission: EM | Admit: 2023-05-05 | Discharge: 2023-05-05 | Disposition: A | Discharge status: Home / Self Care | Attending: Emergency Medicine | Admitting: Emergency Medicine

## 2023-05-05 ENCOUNTER — Emergency Department (HOSPITAL_BASED_OUTPATIENT_CLINIC_OR_DEPARTMENT_OTHER): Admitting: Radiology

## 2023-05-05 ENCOUNTER — Encounter (HOSPITAL_BASED_OUTPATIENT_CLINIC_OR_DEPARTMENT_OTHER): Payer: Self-pay | Admitting: Emergency Medicine

## 2023-05-05 ENCOUNTER — Other Ambulatory Visit: Payer: Self-pay

## 2023-05-05 DIAGNOSIS — R509 Fever, unspecified: Secondary | ICD-10-CM | POA: Insufficient documentation

## 2023-05-05 DIAGNOSIS — D72829 Elevated white blood cell count, unspecified: Secondary | ICD-10-CM | POA: Diagnosis not present

## 2023-05-05 DIAGNOSIS — S2243XA Multiple fractures of ribs, bilateral, initial encounter for closed fracture: Secondary | ICD-10-CM | POA: Diagnosis not present

## 2023-05-05 DIAGNOSIS — Z79899 Other long term (current) drug therapy: Secondary | ICD-10-CM | POA: Insufficient documentation

## 2023-05-05 DIAGNOSIS — I110 Hypertensive heart disease with heart failure: Secondary | ICD-10-CM | POA: Insufficient documentation

## 2023-05-05 DIAGNOSIS — Z7982 Long term (current) use of aspirin: Secondary | ICD-10-CM | POA: Diagnosis not present

## 2023-05-05 DIAGNOSIS — R918 Other nonspecific abnormal finding of lung field: Secondary | ICD-10-CM | POA: Diagnosis not present

## 2023-05-05 DIAGNOSIS — R0602 Shortness of breath: Secondary | ICD-10-CM | POA: Diagnosis not present

## 2023-05-05 DIAGNOSIS — J449 Chronic obstructive pulmonary disease, unspecified: Secondary | ICD-10-CM | POA: Insufficient documentation

## 2023-05-05 DIAGNOSIS — I509 Heart failure, unspecified: Secondary | ICD-10-CM | POA: Diagnosis not present

## 2023-05-05 DIAGNOSIS — R000 Tachycardia, unspecified: Secondary | ICD-10-CM | POA: Diagnosis not present

## 2023-05-05 DIAGNOSIS — J9811 Atelectasis: Secondary | ICD-10-CM | POA: Diagnosis not present

## 2023-05-05 LAB — URINALYSIS, W/ REFLEX TO CULTURE (INFECTION SUSPECTED)
Bacteria, UA: NONE SEEN
Bilirubin Urine: NEGATIVE
Glucose, UA: NEGATIVE mg/dL
Hgb urine dipstick: NEGATIVE
Ketones, ur: NEGATIVE mg/dL
Leukocytes,Ua: NEGATIVE
Nitrite: NEGATIVE
Protein, ur: 100 mg/dL — AB
Specific Gravity, Urine: >1.046 — ABNORMAL HIGH (ref 1.005–1.030)
pH: 7 (ref 5.0–8.0)

## 2023-05-05 LAB — CBC WITH DIFFERENTIAL/PLATELET
Abs Immature Granulocytes: 0.06 10*3/uL (ref 0.00–0.07)
Basophils Absolute: 0.1 10*3/uL (ref 0.0–0.1)
Basophils Relative: 1 %
Eosinophils Absolute: 0 10*3/uL (ref 0.0–0.5)
Eosinophils Relative: 0 %
HCT: 40.5 % (ref 39.0–52.0)
Hemoglobin: 14.1 g/dL (ref 13.0–17.0)
Immature Granulocytes: 1 %
Lymphocytes Relative: 3 %
Lymphs Abs: 0.3 10*3/uL — ABNORMAL LOW (ref 0.7–4.0)
MCH: 29.3 pg (ref 26.0–34.0)
MCHC: 34.8 g/dL (ref 30.0–36.0)
MCV: 84 fL (ref 80.0–100.0)
Monocytes Absolute: 0.5 10*3/uL (ref 0.1–1.0)
Monocytes Relative: 5 %
Neutro Abs: 9.6 10*3/uL — ABNORMAL HIGH (ref 1.7–7.7)
Neutrophils Relative %: 90 %
Platelets: 250 10*3/uL (ref 150–400)
RBC: 4.82 MIL/uL (ref 4.22–5.81)
RDW: 13 % (ref 11.5–15.5)
WBC: 10.5 10*3/uL (ref 4.0–10.5)
nRBC: 0 % (ref 0.0–0.2)

## 2023-05-05 LAB — BASIC METABOLIC PANEL WITH GFR
Anion gap: 14 (ref 5–15)
BUN: 15 mg/dL (ref 8–23)
CO2: 24 mmol/L (ref 22–32)
Calcium: 9.6 mg/dL (ref 8.9–10.3)
Chloride: 100 mmol/L (ref 98–111)
Creatinine, Ser: 0.74 mg/dL (ref 0.61–1.24)
GFR, Estimated: >60 mL/min (ref >60)
Glucose, Bld: 119 mg/dL — ABNORMAL HIGH (ref 70–99)
Potassium: 3.8 mmol/L (ref 3.5–5.1)
Sodium: 138 mmol/L (ref 135–145)

## 2023-05-05 LAB — LACTIC ACID, PLASMA
Lactic Acid, Venous: 2.7 mmol/L (ref 0.5–1.9)
Lactic Acid, Venous: 2.1 mmol/L (ref 0.5–1.9)

## 2023-05-05 LAB — CBC
HCT: 43.4 % (ref 39.0–52.0)
Hemoglobin: 15.1 g/dL (ref 13.0–17.0)
MCH: 29.3 pg (ref 26.0–34.0)
MCHC: 34.8 g/dL (ref 30.0–36.0)
MCV: 84.1 fL (ref 80.0–100.0)
Platelets: 254 10*3/uL (ref 150–400)
RBC: 5.16 MIL/uL (ref 4.22–5.81)
RDW: 12.9 % (ref 11.5–15.5)
WBC: 12 10*3/uL — ABNORMAL HIGH (ref 4.0–10.5)
nRBC: 0 % (ref 0.0–0.2)

## 2023-05-05 LAB — RESP PANEL BY RT-PCR (RSV, FLU A&B, COVID)  RVPGX2
Influenza A by PCR: NEGATIVE
Influenza B by PCR: NEGATIVE
Resp Syncytial Virus by PCR: NEGATIVE
SARS Coronavirus 2 by RT PCR: NEGATIVE

## 2023-05-05 LAB — PRO BRAIN NATRIURETIC PEPTIDE: Pro Brain Natriuretic Peptide: 85.7 pg/mL (ref <300.0)

## 2023-05-05 MED ORDER — IOHEXOL 350 MG/ML SOLN
100.0000 mL | Freq: Once | INTRAVENOUS | Status: AC | PRN
Start: 2023-05-05 — End: 2023-05-05
  Administered 2023-05-05: 100 mL via INTRAVENOUS

## 2023-05-05 MED ORDER — KETOROLAC TROMETHAMINE 15 MG/ML IJ SOLN
15.0000 mg | Freq: Once | INTRAMUSCULAR | Status: AC
  Administered 2023-05-05: 15 mg via INTRAVENOUS
  Filled 2023-05-05: qty 1

## 2023-05-05 MED ORDER — METHYLPREDNISOLONE SODIUM SUCC 125 MG IJ SOLR
125.0000 mg | Freq: Every day | INTRAMUSCULAR | Status: DC
  Administered 2023-05-05: 125 mg via INTRAVENOUS
  Filled 2023-05-05: qty 2

## 2023-05-05 MED ORDER — MAGNESIUM SULFATE 50 % IJ SOLN
1.0000 g | Freq: Once | INTRAMUSCULAR | Status: AC
  Administered 2023-05-05: 1 g via INTRAVENOUS
  Filled 2023-05-05: qty 2

## 2023-05-05 MED ORDER — SODIUM CHLORIDE 0.9 % IV SOLN: 1.0000 g | Freq: Once | INTRAVENOUS | Status: DC

## 2023-05-05 MED ORDER — SODIUM CHLORIDE 0.9 % IV SOLN
2.0000 g | Freq: Once | INTRAVENOUS | Status: AC
  Administered 2023-05-05: 2 g via INTRAVENOUS
  Filled 2023-05-05: qty 12.5

## 2023-05-05 MED ORDER — IPRATROPIUM-ALBUTEROL 0.5-2.5 (3) MG/3ML IN SOLN
3.0000 mL | Freq: Once | RESPIRATORY_TRACT | Status: AC
  Administered 2023-05-05: 3 mL via RESPIRATORY_TRACT
  Filled 2023-05-05: qty 3

## 2023-05-05 MED ORDER — SODIUM CHLORIDE 0.9 % IV SOLN
500.0000 mg | Freq: Once | INTRAVENOUS | Status: AC
  Administered 2023-05-05: 500 mg via INTRAVENOUS
  Filled 2023-05-05: qty 5

## 2023-05-05 MED ORDER — ACETAMINOPHEN 500 MG PO TABS
1000.0000 mg | ORAL_TABLET | Freq: Four times a day (QID) | ORAL | Status: DC | PRN
  Administered 2023-05-05: 1000 mg via ORAL
  Filled 2023-05-05: qty 2

## 2023-05-05 MED ORDER — ALBUTEROL SULFATE HFA 108 (90 BASE) MCG/ACT IN AERS
1.0000 | INHALATION_SPRAY | Freq: Once | RESPIRATORY_TRACT | Status: AC
  Administered 2023-05-05: 1 via RESPIRATORY_TRACT
  Filled 2023-05-05: qty 6.7

## 2023-05-05 MED ORDER — SODIUM CHLORIDE 0.9 % IV BOLUS
500.0000 mL | Freq: Once | INTRAVENOUS | Status: AC
  Administered 2023-05-05: 500 mL via INTRAVENOUS

## 2023-05-05 NOTE — ED Notes (Signed)
 ED Provider at bedside.

## 2023-05-05 NOTE — ED Triage Notes (Signed)
 C/o sore throat and fever since waking up this morning. Also reports increased SHOB. Worse when laying down. Hx of CHF  Shaking and anxious in triage. Tachy in 120s.

## 2023-05-05 NOTE — Discharge Instructions (Signed)
 While you were in the emergency room, you had tests done to look for a cause of your fever.  With all of the tests that we did, we were not able to identify a source.  We will call you if your blood cultures are positive and it is very important that you come to the emergency department if you receive that phone call.  Going forward over the next couple of days, take Motrin and Tylenol  for fever.  Return to the emergency room if you develop difficulty with your breathing, confusion, abdominal pain, or feel as though you are getting worse.

## 2023-05-05 NOTE — ED Provider Notes (Signed)
 [facility] AT [facility] Provider Note   CSN: [id number] Arrival date & time: 05/05/23  1709     History  Chief Complaint  Patient presents with   Shortness of Breath    Tanner Chang is a 68 y.o. male.  With past medical history of COPD, heart failure, hypertension reporting to emergency room with complaint of shortness of breath.  Patient reports when he woke up he had a bit of a scratchy throat and felt very short of breath.  He recently traveled from [place]  to [place] yesterday on a plane.  Did not bring rescue inhaler with him.  He feels like he is having COPD exacerbation and wheezing.  Has not noticed any extremity pain or swelling.  Denies cough.  Did have fever of 102 F earlier this morning which came down with Tylenol . No PE history.    Shortness of Breath      Home Medications Prior to Admission medications   Medication Sig Start Date End Date Taking? Authorizing Provider  acetaminophen  (TYLENOL ) 500 MG tablet Take 1,000 mg by mouth every 6 (six) hours as needed for mild pain or headache.    [provider]  albuterol  (PROVENTIL ) (2.5 MG/3ML) 0.083% nebulizer solution Take 3 mLs (2.5 mg total) by nebulization every 6 (six) hours as needed for wheezing or shortness of breath. 06/29/20   Curatolo, Adam, DO  albuterol  (VENTOLIN  HFA) 108 (90 Base) MCG/ACT inhaler Inhale 2 puffs into the lungs every 4 (four) hours as needed for wheezing or shortness of breath. 12/14/19   Camellia Caves, MD  Aspirin-Salicylamide-Caffeine (BC HEADACHE POWDER PO) Take 2 packets by mouth daily as needed (headaches).    [provider]  atorvastatin  (LIPITOR) 10 MG tablet Take 10 mg by mouth at bedtime.    [provider]  buPROPion  (WELLBUTRIN  XL) 300 MG 24 hr tablet Take 300 mg by mouth daily with breakfast.    [provider]  FLUoxetine  (PROZAC ) 20 MG capsule Take 20 mg by mouth in the morning.    [provider]  fluticasone -salmeterol (WIXELA INHUB) 500-50 MCG/ACT AEPB Inhale 1 puff into the lungs in the morning and at bedtime. 10/01/21   Hunsucker, Archer Kobs, MD  furosemide  (LASIX ) 40 MG tablet Take 1 tablet (40 mg total) by mouth daily. 03/27/20   Daren Eck, DO  Ipratropium-Albuterol  (COMBIVENT  RESPIMAT) 20-100 MCG/ACT AERS respimat Inhale 2 puffs into the lungs daily.    [provider]  losartan  (COZAAR ) 100 MG tablet Take 100 mg by mouth in the morning.    [provider]  Multiple Vitamins-Minerals (ONE-A-DAY MENS 50+ ADVANTAGE) TABS Take 1 tablet by mouth daily with breakfast.    [provider]  omeprazole (PRILOSEC) 20 MG capsule Take 20 mg by mouth daily before breakfast.    [provider]  OVER THE COUNTER MEDICATION Take 1 tablet by mouth daily. Vitamin d , vitamin c, zinc combo vitamin    [provider]  Oxcarbazepine  (TRILEPTAL ) 300 MG tablet Take 300 mg by mouth 2 (two) times daily.    [provider]  potassium chloride  SA (KLOR-CON ) 20 MEQ tablet Take 1 tablet (20 mEq total) by mouth daily. 05/02/20   Jerilynn Montenegro, MD  Psyllium (METAMUCIL PO) Take by mouth See admin instructions. Mix 1 tablespoonful of powder into 8 ounces of water and drink once a day as needed for constipation    [provider]  Tiotropium Bromide Monohydrate  (SPIRIVA  RESPIMAT) 2.5 MCG/ACT AERS  Inhale 2 puffs into the lungs daily. 10/01/21   Hunsucker, Archer Kobs, MD      Allergies    Penicillins    Review of Systems   Review of Systems  Respiratory:  Positive for shortness of breath.     Physical Exam Updated Vital Signs BP (!) 155/101 (BP Location: Right Arm)   Pulse (!) 124   Temp 98.9 F (37.2 C) (Oral)   Resp 18   SpO2 99%  Physical Exam Vitals and nursing note reviewed.  Constitutional:      General: He is not in acute distress.    Appearance: He is not toxic-appearing.  HENT:     Head: Normocephalic and atraumatic.  Eyes:      General: No scleral icterus.    Conjunctiva/sclera: Conjunctivae normal.  Cardiovascular:     Rate and Rhythm: Regular rhythm. Tachycardia present.     Pulses: Normal pulses.     Heart sounds: Normal heart sounds.  Pulmonary:     Effort: Pulmonary effort is normal. No respiratory distress.     Breath sounds: Wheezing and rhonchi present.     Comments: No acute distress, speaking in full sentences, no increased RR Chest:     Chest wall: No tenderness.  Abdominal:     General: Abdomen is flat. Bowel sounds are normal.     Palpations: Abdomen is soft.     Tenderness: There is no abdominal tenderness.  Skin:    General: Skin is warm and dry.     Findings: No lesion.  Neurological:     General: No focal deficit present.     Mental Status: He is alert and oriented to person, place, and time. Mental status is at baseline.     ED Results / Procedures / Treatments   Labs (all labs ordered are listed, but only abnormal results are displayed) Labs Reviewed  BASIC METABOLIC PANEL WITH GFR - Abnormal; Notable for the following components:      Result Value   Glucose, Bld 119 (*)    All other components within normal limits  CBC - Abnormal; Notable for the following components:   WBC 12.0 (*)    All other components within normal limits  LACTIC ACID, PLASMA - Abnormal; Notable for the following components:   Lactic Acid, Venous 2.7 (*)    All other components within normal limits  LACTIC ACID, PLASMA - Abnormal; Notable for the following components:   Lactic Acid, Venous 2.1 (*)    All other components within normal limits  CBC WITH DIFFERENTIAL/PLATELET - Abnormal; Notable for the following components:   Neutro Abs 9.6 (*)    Lymphs Abs 0.3 (*)    All other components within normal limits  URINALYSIS, W/ REFLEX TO CULTURE (INFECTION SUSPECTED) - Abnormal; Notable for the following components:   Specific Gravity, Urine >1.046 (*)    Protein, ur 100 (*)    All other components  within normal limits  RESP PANEL BY RT-PCR (RSV, FLU A&B, COVID)  [id number]  CULTURE, BLOOD (ROUTINE X 2)  CULTURE, BLOOD (ROUTINE X 2)  PRO BRAIN NATRIURETIC PEPTIDE    EKG EKG Interpretation Date/Time:  Tuesday May 05 2023 17:29:14 EDT Ventricular Rate:  127 PR Interval:  146 QRS Duration:  84 QT Interval:  410 QTC Calculation: 595 R Axis:   101  Text Interpretation:  Critical Test Result: Long QTc Sinus tachycardia Rightward axis Low voltage QRS Nonspecific ST and T wave abnormality Abnormal ECG When compared with ECG  of 29-Jun-2020 12:55, PREVIOUS ECG IS PRESENT Confirmed by Afton Horse [phone]) on 05/05/2023 7:47:45 PM  Radiology CT Angio Chest PE W and/or Wo Contrast Result Date: 05/05/2023 CLINICAL DATA:  Sore throat and fever with shortness of breath. EXAM: CT ANGIOGRAPHY CHEST WITH CONTRAST TECHNIQUE: Multidetector CT imaging of the chest was performed using the standard protocol during bolus administration of intravenous contrast. Multiplanar CT image reconstructions and MIPs were obtained to evaluate the vascular anatomy. RADIATION DOSE REDUCTION: This exam was performed according to the departmental dose-optimization program which includes automated exposure control, adjustment of the mA and/or kV according to patient size and/or use of iterative reconstruction technique. CONTRAST:  OMNIPAQUE  IOHEXOL  350 MG/ML SOLN COMPARISON:  May 24, 2020 FINDINGS: Cardiovascular: There is moderate severity calcification of the aortic arch. Satisfactory opacification of the pulmonary arteries to the segmental level. No evidence of pulmonary embolism. Normal heart size with moderate to marked severity coronary artery calcification. No pericardial effusion. Mediastinum/Nodes: No enlarged mediastinal, hilar, or axillary lymph nodes. Thyroid gland, trachea, and esophagus demonstrate no significant findings. Lungs/Pleura: Mild atelectasis and/or infiltrate is seen within the posterior aspect of  the left upper lobe. No pleural effusion or pneumothorax is identified. Upper Abdomen: No acute abnormality. Musculoskeletal: A stable, benign 2.5 cm simple cystic appearing lesion (approximately 28.33 Hounsfield units) is seen within the subcutaneous fat of the posterior chest wall, to the left of midline. Chronic bilateral rib fractures are seen. No acute osseous abnormality is identified. Review of the MIP images confirms the above findings. IMPRESSION: 1. No acute pulmonary embolism. 2. Mild posterior left upper lobe atelectasis and/or infiltrate. Follow-up to resolution is recommended. Electronically Signed   By: Virgle Grime M.D.   On: 05/05/2023 19:55   DG Chest 2 View Result Date: 05/05/2023 CLINICAL DATA:  Fever, shortness of breath. EXAM: CHEST - 2 VIEW COMPARISON:  June 29, 2020. FINDINGS: The heart size and mediastinal contours are within normal limits. Right lung is clear. Minimal left lingular subsegmental atelectasis is noted. The visualized skeletal structures are unremarkable. IMPRESSION: Minimal left lingular subsegmental atelectasis. Electronically Signed   By: Rosalene Colon M.D.   On: 05/05/2023 17:50    Procedures Procedures    Medications Ordered in ED Medications  ipratropium-albuterol  (DUONEB) 0.5-2.5 (3) MG/3ML nebulizer solution 3 mL (3 mLs Nebulization Given 05/05/23 1912)  sodium chloride  0.9 % bolus 500 mL (0 mLs Intravenous Stopped 05/05/23 1937)  iohexol  (OMNIPAQUE ) 350 MG/ML injection 100 mL (100 mLs Intravenous Contrast Given 05/05/23 1942)  albuterol  (VENTOLIN  HFA) 108 (90 Base) MCG/ACT inhaler 1 puff (1 puff Inhalation Given 05/05/23 1958)  azithromycin  (ZITHROMAX ) 500 mg in sodium chloride  0.9 % 250 mL IVPB (0 mg Intravenous Stopped 05/05/23 2315)  ceFEPIme  (MAXIPIME ) 2 g in sodium chloride  0.9 % 100 mL IVPB (0 g Intravenous Stopped 05/05/23 2213)  ketorolac  (TORADOL ) 15 MG/ML injection 15 mg (15 mg Intravenous Given 05/05/23 2108)  magnesium  sulfate (IV  Push/IM) injection 1 g (1 g Intravenous Given 05/05/23 2227)    ED Course/ Medical Decision Making/ A&P                                 Medical Decision Making Amount and/or Complexity of Data Reviewed Labs: ordered. Radiology: ordered.  Risk OTC drugs. Prescription drug management.   This patient presents to the ED for concern of shortness of breath, this involves an extensive number of treatment options, and is a  complaint that carries with it a high risk of complications and morbidity.  The differential diagnosis includes PA, PNA, ACS, COPD, CHF, URI   Co morbidities that complicate the patient evaluation  COPD CHF    Lab Tests:  I personally interpreted labs.  The pertinent results include:   CBC with mild leukocytosis, no anemia UA with specific gravity >1.046, small protein Blood cultures pending  No AKI Resp panel negative  Lactic 2.6, improved to 2.1 after treatment    Imaging Studies ordered:  I ordered imaging studies including chest x-ray, PE study I independently visualized and interpreted imaging which showed no acute findings, no PE I agree with the radiologist interpretation   Cardiac Monitoring: / EKG:  The patient was maintained on a cardiac monitor.  I personally viewed and interpreted the cardiac monitored which showed an underlying rhythm of: sinus tachy with prolonged QT   Problem List / ED Course / Critical interventions / Medication management  Patient presenting with complaint of shortness of breath. He has history of COPD, reports he left inhaler at home while traveling to [place] and has not been able to use it. Mild diffuse wheezing on exam, no sign of distress, 92-95% on RA. No sign of fluids overload. No cough. Has had associated fever, febrile and sinus tachycardia on arrival. Will obtain labs, rule out PE or other acute abnormalities. Given fever and tachycardia will start fluids and Abx, willl give neb and steriods for suspected  COPD ex and Mag for QT prolongation and reassess. Patient reports feeling slightly better after neb, will repeat, still tachy and placed on 2L [place]. Labs show mild leukocytosis, lactic elevated, will repeat after treatment. Blood cultures are pending. No pneumonia on chest x-ray, no PE on CT study. Has had mild improvement in tachycardia after neb.  I ordered medication including duoneb, solumedrol, NS, abx, Mag Reevaluation of the patient after these medicines showed that the patient improved I have reviewed the patients home medicines and have made adjustments as needed   Plan  Signed off to attending Dr Florie Husband at shift change pending reassessment.         Final Clinical Impression(s) / ED Diagnoses Final diagnoses:  None    Rx / DC Orders ED Discharge Orders     None         Eudora Heron, PA-C 05/06/23 1021    Afton Horse T, DO 05/09/23 0002

## 2023-05-05 NOTE — ED Notes (Signed)
 Reviewed discharge instructions and follow up care with pt. Pt verbalized understanding and had no further questions. Pt exited ED without complications.

## 2023-05-05 NOTE — ED Notes (Signed)
 Patient transported to CT

## 2023-05-05 NOTE — ED Provider Triage Note (Signed)
 Emergency Medicine Provider Triage Evaluation Note  Tanner Chang , a 68 y.o. male  was evaluated in triage.  Pt complains of URI with fever.  Review of Systems  Positive: Fever, cough, SOB Negative: Vomiting, pain  Physical Exam  BP (!) 155/101 (BP Location: Right Arm)   Pulse (!) 124   Temp 98.9 F (37.2 C) (Oral)   Resp 18   SpO2 99%  Gen:   Awake, no distress   Resp:  Normal effort  MSK:   Moves extremities without difficulty  Other:    Medical Decision Making  Medically screening exam initiated at 5:39 PM.  Appropriate orders placed.  Tanner Chang was informed that the remainder of the evaluation will be completed by another provider, this initial triage assessment does not replace that evaluation, and the importance of remaining in the ED until their evaluation is complete.  Symptoms since this morning after air travel yesterday.    Mandy Second, PA-C 05/05/23 1740

## 2023-05-05 NOTE — ED Notes (Signed)
 Blood cultures x2 drawn before starting atb.

## 2023-05-05 NOTE — ED Provider Notes (Signed)
  Physical Exam  BP 133/76   Pulse (!) 52   Temp 100.1 F (37.8 C) (Oral)   Resp 15   SpO2 94%   Physical Exam  Procedures  Procedures  ED Course / MDM    Medical Decision Making Amount and/or Complexity of Data Reviewed Labs: ordered. Radiology: ordered.  Risk OTC drugs. Prescription drug management.   Melody Spurling, assumed care for this patient.  In brief, this is a 68 year old male who is here today for fever, shakes.  Patient arrived in the emergency room, febrile and tachycardic.  He had a chest x-ray done that was normal.  Has no abdominal pain, negative urinalysis.  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  Patient probably should be on home oxygen  at baseline, however he says that he does not use any.   Patient's symptoms began with sore throat.  It is most likely viral syndrome.  Monitor the patient in emergency room for several hours for signs of deterioration.  I considered bacteremia in this patient, however white count only mildly elevated and on a repeat ordered so that we get a differential was not elevated.  There is a slight neutrophilic predominance.  Lactic downtrending.  When I reassessed the patient at 1040, he looked improved from earlier with fever control.  Please this is likely viral syndrome.  Discussed strict return precautions with the patient and he was agreeable with this plan.  Will discharge.      Afton Horse T, DO 05/05/23 2254

## 2023-05-06 ENCOUNTER — Telehealth (HOSPITAL_BASED_OUTPATIENT_CLINIC_OR_DEPARTMENT_OTHER): Payer: Self-pay

## 2023-05-06 LAB — BLOOD CULTURE ID PANEL (REFLEXED) - BCID2

## 2023-05-08 LAB — CULTURE, BLOOD (ROUTINE X 2): Special Requests: ADEQUATE

## 2023-05-09 ENCOUNTER — Telehealth (HOSPITAL_BASED_OUTPATIENT_CLINIC_OR_DEPARTMENT_OTHER): Payer: Self-pay | Admitting: *Deleted

## 2023-05-09 NOTE — Progress Notes (Signed)
 ED Antimicrobial Stewardship Positive Culture Follow Up   Tanner Chang is an 68 y.o. male who presented to [facility] on @ADMITDT @ with a chief complaint of  Chief Complaint  Patient presents with   Shortness of Breath    Recent Results (from the past 720 hours)  Blood culture (routine x 2)     Status: None (Preliminary result)   Collection Time: 05/05/23  6:02 PM   Specimen: BLOOD RIGHT FOREARM  Result Value Ref Range Status   Specimen Description   Final    BLOOD RIGHT FOREARM Performed at [facility], [address] [place], [place] [address]    Special Requests   Final    BOTTLES DRAWN AEROBIC AND ANAEROBIC Blood Culture adequate volume Performed at [facility], [address] [place], [place] [address]    Culture   Final    NO GROWTH 4 DAYS Performed at [facility], [address], [place], [place] [address]    Report Status PENDING  Incomplete  Blood culture (routine x 2)     Status: Abnormal   Collection Time: 05/05/23  6:03 PM   Specimen: BLOOD  Result Value Ref Range Status   Specimen Description   Final    BLOOD RIGHT ANTECUBITAL Performed at [facility], [address], [place], [place] [address]    Special Requests   Final    BOTTLES DRAWN AEROBIC AND ANAEROBIC Blood Culture adequate volume Performed at [facility], [address], [place], [place] [address]    Culture  Setup Time   Final    GRAM POSITIVE COCCI IN CLUSTERS ANAEROBIC BOTTLE ONLY CRITICAL RESULT CALLED TO, READ BACK BY AND VERIFIED WITH: RN AMANDA CALLIHAN ON 05/06/23 @ 1446 BY DRT    Culture (A)  Final    STAPHYLOCOCCUS EPIDERMIDIS THE SIGNIFICANCE OF ISOLATING THIS ORGANISM FROM A SINGLE SET OF BLOOD CULTURES WHEN MULTIPLE SETS ARE DRAWN IS UNCERTAIN. PLEASE NOTIFY THE MICROBIOLOGY DEPARTMENT WITHIN ONE WEEK IF SPECIATION AND SENSITIVITIES ARE REQUIRED. Performed at [facility], [address]  [place], [place] [address]    Report Status 05/08/2023 FINAL  Final  Blood Culture ID Panel (Reflexed)     Status: Abnormal   Collection Time: 05/05/23  6:03 PM  Result Value Ref Range Status   Enterococcus faecalis NOT DETECTED NOT DETECTED Final   Enterococcus Faecium NOT DETECTED NOT DETECTED Final   Listeria monocytogenes NOT DETECTED NOT DETECTED Final   Staphylococcus species DETECTED (A) NOT DETECTED Final    Comment: CRITICAL RESULT CALLED TO, READ BACK BY AND VERIFIED WITH: RN AMANDA CALLIHAN ON 05/06/23 @ 1446 BY DRT    Staphylococcus aureus (BCID) NOT DETECTED NOT DETECTED Final   Staphylococcus epidermidis DETECTED (A) NOT DETECTED Final    Comment: Methicillin (oxacillin) resistant coagulase negative staphylococcus. Possible blood culture contaminant (unless isolated from more than one blood culture draw or clinical case suggests pathogenicity). No antibiotic treatment is indicated for blood  culture contaminants. CRITICAL RESULT CALLED TO, READ BACK BY AND VERIFIED WITH: RN AMANDA CALLIHAN ON 05/06/23 @ 1446 BY DRT    Staphylococcus lugdunensis NOT DETECTED NOT DETECTED Final   Streptococcus species NOT DETECTED NOT DETECTED Final   Streptococcus agalactiae NOT DETECTED NOT DETECTED Final   Streptococcus pneumoniae NOT DETECTED NOT DETECTED Final   Streptococcus pyogenes NOT DETECTED NOT DETECTED Final   A.calcoaceticus-baumannii NOT DETECTED NOT DETECTED Final   Bacteroides fragilis NOT DETECTED NOT DETECTED Final  Enterobacterales NOT DETECTED NOT DETECTED Final   Enterobacter cloacae complex NOT DETECTED NOT DETECTED Final   Escherichia coli NOT DETECTED NOT DETECTED Final   Klebsiella aerogenes NOT DETECTED NOT DETECTED Final   Klebsiella oxytoca NOT DETECTED NOT DETECTED Final   Klebsiella pneumoniae NOT DETECTED NOT DETECTED Final   Proteus species NOT DETECTED NOT DETECTED Final   Salmonella species NOT DETECTED NOT DETECTED Final   Serratia marcescens NOT DETECTED  NOT DETECTED Final   Haemophilus influenzae NOT DETECTED NOT DETECTED Final   Neisseria meningitidis NOT DETECTED NOT DETECTED Final   Pseudomonas aeruginosa NOT DETECTED NOT DETECTED Final   Stenotrophomonas maltophilia NOT DETECTED NOT DETECTED Final   Candida albicans NOT DETECTED NOT DETECTED Final   Candida auris NOT DETECTED NOT DETECTED Final   Candida glabrata NOT DETECTED NOT DETECTED Final   Candida krusei NOT DETECTED NOT DETECTED Final   Candida parapsilosis NOT DETECTED NOT DETECTED Final   Candida tropicalis NOT DETECTED NOT DETECTED Final   Cryptococcus neoformans/gattii NOT DETECTED NOT DETECTED Final   Methicillin resistance mecA/C DETECTED (A) NOT DETECTED Final    Comment: CRITICAL RESULT CALLED TO, READ BACK BY AND VERIFIED WITH: RN AMANDA CALLIHAN ON 05/06/23 @ 1446 BY DRT Performed at [facility], [address], [place], [place] [address]   Resp panel by RT-PCR (RSV, Flu A&B, Covid) Anterior Nasal Swab     Status: None   Collection Time: 05/05/23  6:52 PM   Specimen: Anterior Nasal Swab  Result Value Ref Range Status   SARS Coronavirus 2 by RT PCR NEGATIVE NEGATIVE Final    Comment: (NOTE) SARS-CoV-2 target nucleic acids are NOT DETECTED.  The SARS-CoV-2 RNA is generally detectable in upper respiratory specimens during the acute phase of infection. The lowest concentration of SARS-CoV-2 viral copies this assay can detect is 138 copies/mL. A negative result does not preclude SARS-Cov-2 infection and should not be used as the sole basis for treatment or other patient management decisions. A negative result may occur with  improper specimen collection/handling, submission of specimen other than nasopharyngeal swab, presence of viral mutation(s) within the areas targeted by this assay, and inadequate number of viral copies(<138 copies/mL). A negative result must be combined with clinical observations, patient history, and epidemiological information. The  expected result is Negative.  Fact Sheet for Patients:  BloggerCourse.com  Fact Sheet for Healthcare Providers:  SeriousBroker.it  This test is no t yet approved or cleared by the [place]  FDA and  has been authorized for detection and/or diagnosis of SARS-CoV-2 by FDA under an Emergency Use Authorization (EUA). This EUA will remain  in effect (meaning this test can be used) for the duration of the COVID-19 declaration under Section 564(b)(1) of the Act, 21 U.S.C.section 360bbb-3(b)(1), unless the authorization is terminated  or revoked sooner.       Influenza A by PCR NEGATIVE NEGATIVE Final   Influenza B by PCR NEGATIVE NEGATIVE Final    Comment: (NOTE) The Xpert Xpress SARS-CoV-2/FLU/RSV plus assay is intended as an aid in the diagnosis of influenza from Nasopharyngeal swab specimens and should not be used as a sole basis for treatment. Nasal washings and aspirates are unacceptable for Xpert Xpress SARS-CoV-2/FLU/RSV testing.  Fact Sheet for Patients: BloggerCourse.com  Fact Sheet for Healthcare Providers: SeriousBroker.it  This test is not yet approved or cleared by the [place]  FDA and has been authorized for detection and/or diagnosis of SARS-CoV-2 by FDA under an Emergency Use Authorization (EUA). This EUA  will remain in effect (meaning this test can be used) for the duration of the COVID-19 declaration under Section 564(b)(1) of the Act, 21 U.S.C. section 360bbb-3(b)(1), unless the authorization is terminated or revoked.     Resp Syncytial Virus by PCR NEGATIVE NEGATIVE Final    Comment: (NOTE) Fact Sheet for Patients: BloggerCourse.com  Fact Sheet for Healthcare Providers: SeriousBroker.it  This test is not yet approved or cleared by the [place]  FDA and has been authorized for detection and/or  diagnosis of SARS-CoV-2 by FDA under an Emergency Use Authorization (EUA). This EUA will remain in effect (meaning this test can be used) for the duration of the COVID-19 declaration under Section 564(b)(1) of the Act, 21 U.S.C. section 360bbb-3(b)(1), unless the authorization is terminated or revoked.  Performed at [facility], [address], [place], [place] [address]     Felt to be contaminant. No action is required. MD Tamela Fake was also previously notified of this finding per chart review.  ED Provider: Archer Bear, PharmD, BCPS 05/09/2023 10:38 AM ED Clinical Pharmacist -  [phone]

## 2023-05-09 NOTE — Telephone Encounter (Signed)
 Post ED Visit - Positive Culture Follow-up  Culture report reviewed by antimicrobial stewardship pharmacist: [facility] []  [place], Pharm.D. []  Skeet Duke, Pharm.D., BCPS AQ-ID []  Leslee Rase, Pharm.D., BCPS []  Garland Junk, Pharm.D., BCPS []  [place], [place].D., BCPS, AAHIVP []  Alcide Aly, Pharm.D., BCPS, AAHIVP []  Jerri Morale, PharmD, BCPS []  Graham Laws, PharmD, BCPS []  Cleda Curly, PharmD, BCPS []  Tamar Fairly, PharmD []  Ballard Levels, PharmD, BCPS [x]  Dionicio Fray, PharmD  [facility] []  Arlyne Bering, PharmD []  Sherryle Don, PharmD []  Van Gelinas, PharmD []  Delila Felty, Rph []  [place]) Cleora Daft, PharmD []  Augustina Block, PharmD []  Arie Kurtz, PharmD []  Sharlyn Deaner, PharmD []  Agnes Hose, PharmD []  Kendall Pauls, PharmD []  Gladstone Lamer, PharmD []  Armanda Bern, PharmD []  Tera Fellows, PharmD   Positive blood culture Contaminent and no further patient follow-up is required at this time per Shyrl Doyne, MD  Georgine Kitchens 05/09/2023, 1:31 PM

## 2023-05-10 LAB — CULTURE, BLOOD (ROUTINE X 2)
Culture: NO GROWTH
Special Requests: ADEQUATE

## 2023-06-25 ENCOUNTER — Emergency Department (HOSPITAL_BASED_OUTPATIENT_CLINIC_OR_DEPARTMENT_OTHER)
Admission: EM | Admit: 2023-06-25 | Discharge: 2023-06-25 | Disposition: A | Discharge status: Home / Self Care | Attending: Emergency Medicine | Admitting: Emergency Medicine

## 2023-06-25 ENCOUNTER — Emergency Department (HOSPITAL_BASED_OUTPATIENT_CLINIC_OR_DEPARTMENT_OTHER): Admitting: Radiology

## 2023-06-25 ENCOUNTER — Encounter (HOSPITAL_BASED_OUTPATIENT_CLINIC_OR_DEPARTMENT_OTHER): Payer: Self-pay | Admitting: Emergency Medicine

## 2023-06-25 ENCOUNTER — Other Ambulatory Visit: Payer: Self-pay

## 2023-06-25 DIAGNOSIS — R000 Tachycardia, unspecified: Secondary | ICD-10-CM | POA: Diagnosis not present

## 2023-06-25 DIAGNOSIS — J9811 Atelectasis: Secondary | ICD-10-CM | POA: Diagnosis not present

## 2023-06-25 DIAGNOSIS — J441 Chronic obstructive pulmonary disease with (acute) exacerbation: Secondary | ICD-10-CM | POA: Insufficient documentation

## 2023-06-25 DIAGNOSIS — Z7951 Long term (current) use of inhaled steroids: Secondary | ICD-10-CM | POA: Insufficient documentation

## 2023-06-25 DIAGNOSIS — Z79899 Other long term (current) drug therapy: Secondary | ICD-10-CM | POA: Diagnosis not present

## 2023-06-25 DIAGNOSIS — I110 Hypertensive heart disease with heart failure: Secondary | ICD-10-CM | POA: Diagnosis not present

## 2023-06-25 DIAGNOSIS — Z8616 Personal history of COVID-19: Secondary | ICD-10-CM | POA: Diagnosis not present

## 2023-06-25 DIAGNOSIS — Z7982 Long term (current) use of aspirin: Secondary | ICD-10-CM | POA: Diagnosis not present

## 2023-06-25 DIAGNOSIS — I509 Heart failure, unspecified: Secondary | ICD-10-CM | POA: Diagnosis not present

## 2023-06-25 DIAGNOSIS — R0602 Shortness of breath: Secondary | ICD-10-CM | POA: Diagnosis not present

## 2023-06-25 DIAGNOSIS — I700 Atherosclerosis of aorta: Secondary | ICD-10-CM | POA: Diagnosis not present

## 2023-06-25 DIAGNOSIS — R059 Cough, unspecified: Secondary | ICD-10-CM | POA: Diagnosis not present

## 2023-06-25 LAB — CBC WITH DIFFERENTIAL/PLATELET
Abs Immature Granulocytes: 0.06 10*3/uL (ref 0.00–0.07)
Basophils Absolute: 0.1 10*3/uL (ref 0.0–0.1)
Basophils Relative: 1 %
Eosinophils Absolute: 0.5 10*3/uL (ref 0.0–0.5)
Eosinophils Relative: 6 %
HCT: 41.9 % (ref 39.0–52.0)
Hemoglobin: 14.2 g/dL (ref 13.0–17.0)
Immature Granulocytes: 1 %
Lymphocytes Relative: 17 %
Lymphs Abs: 1.4 10*3/uL (ref 0.7–4.0)
MCH: 30.2 pg (ref 26.0–34.0)
MCHC: 33.9 g/dL (ref 30.0–36.0)
MCV: 89.1 fL (ref 80.0–100.0)
Monocytes Absolute: 0.7 10*3/uL (ref 0.1–1.0)
Monocytes Relative: 8 %
Neutro Abs: 5.7 10*3/uL (ref 1.7–7.7)
Neutrophils Relative %: 67 %
Platelets: 204 10*3/uL (ref 150–400)
RBC: 4.7 MIL/uL (ref 4.22–5.81)
RDW: 14.1 % (ref 11.5–15.5)
WBC: 8.3 10*3/uL (ref 4.0–10.5)
nRBC: 0 % (ref 0.0–0.2)

## 2023-06-25 LAB — BASIC METABOLIC PANEL WITH GFR
Anion gap: 18 — ABNORMAL HIGH (ref 5–15)
BUN: 30 mg/dL — ABNORMAL HIGH (ref 8–23)
CO2: 22 mmol/L (ref 22–32)
Calcium: 10 mg/dL (ref 8.9–10.3)
Chloride: 100 mmol/L (ref 98–111)
Creatinine, Ser: 1.07 mg/dL (ref 0.61–1.24)
GFR, Estimated: >60 mL/min (ref >60)
Glucose, Bld: 151 mg/dL — ABNORMAL HIGH (ref 70–99)
Potassium: 3.5 mmol/L (ref 3.5–5.1)
Sodium: 140 mmol/L (ref 135–145)

## 2023-06-25 LAB — D-DIMER, QUANTITATIVE: D-Dimer, Quant: <0.27 ug{FEU}/mL (ref 0.00–0.50)

## 2023-06-25 LAB — PRO BRAIN NATRIURETIC PEPTIDE: Pro Brain Natriuretic Peptide: 39.9 pg/mL (ref <300.0)

## 2023-06-25 MED ORDER — DOXYCYCLINE HYCLATE 100 MG PO CAPS: 100.0000 mg | ORAL_CAPSULE | Freq: Two times a day (BID) | ORAL | 0 refills | Status: DC

## 2023-06-25 MED ORDER — IPRATROPIUM-ALBUTEROL 0.5-2.5 (3) MG/3ML IN SOLN
RESPIRATORY_TRACT | Status: AC
  Administered 2023-06-25: 3 mL via RESPIRATORY_TRACT
  Filled 2023-06-25: qty 3

## 2023-06-25 MED ORDER — ALBUTEROL SULFATE (2.5 MG/3ML) 0.083% IN NEBU
5.0000 mg | INHALATION_SOLUTION | Freq: Once | RESPIRATORY_TRACT | Status: AC
  Administered 2023-06-25: 5 mg via RESPIRATORY_TRACT
  Filled 2023-06-25: qty 6

## 2023-06-25 MED ORDER — PREDNISONE 20 MG PO TABS: ORAL_TABLET | ORAL | 0 refills | Status: DC

## 2023-06-25 MED ORDER — DEXAMETHASONE SODIUM PHOSPHATE 10 MG/ML IJ SOLN
10.0000 mg | Freq: Once | INTRAMUSCULAR | Status: AC
  Administered 2023-06-25: 10 mg via INTRAVENOUS
  Filled 2023-06-25: qty 1

## 2023-06-25 MED ORDER — SODIUM CHLORIDE 0.9 % IV BOLUS
500.0000 mL | Freq: Once | INTRAVENOUS | Status: AC
  Administered 2023-06-25: 500 mL via INTRAVENOUS

## 2023-06-25 MED ORDER — IPRATROPIUM-ALBUTEROL 0.5-2.5 (3) MG/3ML IN SOLN: 3.0000 mL | Freq: Once | RESPIRATORY_TRACT | Status: AC

## 2023-06-25 MED ORDER — DOXYCYCLINE HYCLATE 100 MG PO TABS
100.0000 mg | ORAL_TABLET | Freq: Once | ORAL | Status: AC
  Administered 2023-06-25: 100 mg via ORAL
  Filled 2023-06-25: qty 1

## 2023-06-25 NOTE — ED Provider Notes (Signed)
 [place] EMERGENCY DEPARTMENT AT [facility] Provider Note   CSN: [id number] Arrival date & time: 06/25/23  1536     Tanner Chang presents with: Shortness of Breath   Tanner Chang is a 68 y.o. male.   Tanner Chang is a 68 year old male who presents with wheezing and shortness of breath.  Tanner Chang has a history of hypertension, COPD, CHF.  Tanner Chang says Tanner Chang had long COVID and since that time Tanner Chang had worsening trouble with his COPD.  Tanner Chang says over the last 2 days Tanner Chang has had some worsening shortness of breath associated wheezing.  Tanner Chang has a cough which is mostly dry, nonproductive.  No associated chest pain or discomfort.  No leg swelling.  No fevers although Tanner Chang woke up last night diaphoretic.  No runny nose or nasal congestion.       Prior to Admission medications   Medication Sig Start Date End Date Taking? Authorizing Provider  doxycycline  (VIBRAMYCIN ) 100 MG capsule Take 1 capsule (100 mg total) by mouth 2 (two) times daily. One po bid x 7 days 06/25/23  Yes Hershel Los, MD  predniSONE  (DELTASONE ) 20 MG tablet 2 tabs po daily x 4 days 06/25/23  Yes Hershel Los, MD  acetaminophen  (TYLENOL ) 500 MG tablet Take 1,000 mg by mouth every 6 (six) hours as needed for mild pain or headache.    [provider]  albuterol  (PROVENTIL ) (2.5 MG/3ML) 0.083% nebulizer solution Take 3 mLs (2.5 mg total) by nebulization every 6 (six) hours as needed for wheezing or shortness of breath. 06/29/20   [place], Adam, DO  albuterol  (VENTOLIN  HFA) 108 (90 Base) MCG/ACT inhaler Inhale 2 puffs into the lungs every 4 (four) hours as needed for wheezing or shortness of breath. 12/14/19   Camellia Caves, MD  Aspirin-Salicylamide-Caffeine (BC HEADACHE POWDER PO) Take 2 packets by mouth daily as needed (headaches).    [provider]  atorvastatin  (LIPITOR) 10 MG tablet Take 10 mg by mouth at bedtime.    [provider]  buPROPion  (WELLBUTRIN  XL) 300 MG 24 hr tablet Take 300 mg by mouth daily with  breakfast.    [provider]  FLUoxetine  (PROZAC ) 20 MG capsule Take 20 mg by mouth in the morning.    [provider]  fluticasone -salmeterol (WIXELA INHUB) 500-50 MCG/ACT AEPB Inhale 1 puff into the lungs in the morning and at bedtime. 10/01/21   Hunsucker, Archer Kobs, MD  furosemide  (LASIX ) 40 MG tablet Take 1 tablet (40 mg total) by mouth daily. 03/27/20   Daren Eck, DO  Ipratropium-Albuterol  (COMBIVENT  RESPIMAT) 20-100 MCG/ACT AERS respimat Inhale 2 puffs into the lungs daily.    [provider]  losartan  (COZAAR ) 100 MG tablet Take 100 mg by mouth in the morning.    [provider]  Multiple Vitamins-Minerals (ONE-A-DAY MENS 50+ ADVANTAGE) TABS Take 1 tablet by mouth daily with breakfast.    [provider]  omeprazole (PRILOSEC) 20 MG capsule Take 20 mg by mouth daily before breakfast.    [provider]  OVER THE COUNTER MEDICATION Take 1 tablet by mouth daily. Vitamin d , vitamin c, zinc combo vitamin    [provider]  Oxcarbazepine  (TRILEPTAL ) 300 MG tablet Take 300 mg by mouth 2 (two) times daily.    [provider]  potassium chloride  SA (KLOR-CON ) 20 MEQ tablet Take 1 tablet (20 mEq total) by mouth daily. 05/02/20   Jerilynn Montenegro, MD  Psyllium (METAMUCIL PO) Take by mouth See admin instructions. Mix 1 tablespoonful of  powder into 8 ounces of water and drink once a day as needed for constipation    [provider]  Tiotropium Bromide Monohydrate  (SPIRIVA  RESPIMAT) 2.5 MCG/ACT AERS Inhale 2 puffs into the lungs daily. 10/01/21   Hunsucker, Archer Kobs, MD    Allergies: Bactrim [sulfamethoxazole-trimethoprim] and Penicillins    Review of Systems  Constitutional:  Positive for fatigue. Negative for chills, diaphoresis and fever.  HENT:  Negative for congestion, rhinorrhea and sneezing.   Eyes: Negative.   Respiratory:  Positive for cough, shortness of breath and wheezing. Negative for chest tightness.    Cardiovascular:  Negative for chest pain and leg swelling.  Gastrointestinal:  Negative for abdominal pain, blood in stool, diarrhea, nausea and vomiting.  Genitourinary:  Negative for difficulty urinating, flank pain, frequency and hematuria.  Musculoskeletal:  Negative for arthralgias and back pain.  Skin:  Negative for rash.  Neurological:  Negative for dizziness, speech difficulty, weakness, numbness and headaches.    Updated Vital Signs BP (!) 157/106   Pulse 100   Temp 98.5 F (36.9 C)   Resp 20   SpO2 92%   Physical Exam Constitutional:      Appearance: Tanner Chang is well-developed.  HENT:     Head: Normocephalic and atraumatic.   Eyes:     Pupils: Pupils are equal, round, and reactive to light.    Cardiovascular:     Rate and Rhythm: Normal rate and regular rhythm.     Heart sounds: Normal heart sounds.  Pulmonary:     Effort: Pulmonary effort is normal. No respiratory distress.     Breath sounds: Decreased breath sounds and wheezing present. No rales.  Chest:     Chest wall: No tenderness.  Abdominal:     General: Bowel sounds are normal.     Palpations: Abdomen is soft.     Tenderness: There is no abdominal tenderness. There is no guarding or rebound.   Musculoskeletal:        General: Normal range of motion.     Cervical back: Normal range of motion and neck supple.     Comments: Trace edema to lower extremities bilaterally  Lymphadenopathy:     Cervical: No cervical adenopathy.   Skin:    General: Skin is warm and dry.     Findings: No rash.   Neurological:     Mental Status: Tanner Chang is alert and oriented to person, place, and time.     (all labs ordered are listed, but only abnormal results are displayed) Labs Reviewed  BASIC METABOLIC PANEL WITH GFR - Abnormal; Notable for the following components:      Result Value   Glucose, Bld 151 (*)    BUN 30 (*)    Anion gap 18 (*)    All other components within normal limits  CBC WITH DIFFERENTIAL/PLATELET   PRO BRAIN NATRIURETIC PEPTIDE  D-DIMER, QUANTITATIVE    EKG: EKG Interpretation Date/Time:  Thursday June 25 2023 15:48:43 EDT Ventricular Rate:  109 PR Interval:  148 QRS Duration:  96 QT Interval:  343 QTC Calculation: 462 R Axis:   73  Text Interpretation: Sinus tachycardia Ventricular premature complex Low voltage, precordial leads since last tracing no significant change Confirmed by Hershel Los [phone]) on 06/25/2023 4:04:17 PM  Radiology: Lenell Query Chest 2 View Result Date: 06/25/2023 CLINICAL DATA:  cough, SOB EXAM: CHEST - 2 VIEW COMPARISON:  May 05, 2023 FINDINGS: Streaky left basilar atelectasis. No focal airspace consolidation, pleural effusion, or pneumothorax. No cardiomegaly. Aortic  atherosclerosis. No acute fracture or destructive lesion. Multilevel thoracic osteophytosis. IMPRESSION: No acute cardiopulmonary abnormality. Electronically Signed   By: Rance Burrows M.D.   On: 06/25/2023 16:19     Procedures   Medications Ordered in the ED  ipratropium-albuterol  (DUONEB) 0.5-2.5 (3) MG/3ML nebulizer solution (  Not Given 06/25/23 1551)  ipratropium-albuterol  (DUONEB) 0.5-2.5 (3) MG/3ML nebulizer solution 3 mL (3 mLs Nebulization Given 06/25/23 1553)  dexamethasone (DECADRON) injection 10 mg (10 mg Intravenous Given 06/25/23 1633)  albuterol  (PROVENTIL ) (2.5 MG/3ML) 0.083% nebulizer solution 5 mg (5 mg Nebulization Given 06/25/23 1724)  sodium chloride  0.9 % bolus 500 mL (0 mLs Intravenous Stopped 06/25/23 1942)  doxycycline  (VIBRA -TABS) tablet 100 mg (100 mg Oral Given 06/25/23 1806)                                    Medical Decision Making Amount and/or Complexity of Data Reviewed Labs: ordered. Radiology: ordered.  Risk Prescription drug management.  Tanner Chang presents to the ED for concern of shortness of breath, Tanner involves an extensive number of treatment options, and is a complaint that carries with it a high risk of complications and morbidity.  I  considered the following differential and admission for Tanner acute, potentially life threatening condition.  The differential diagnosis includes COPD, asthma, ACS, PE, pulmonary edema, pulmonary embolus, pneumonia  MDM:    Tanner Chang is a 68 year old who presents with wheezing and shortness of breath.  Tanner Chang has had similar symptoms in the past.  No associated chest pain.  No leg swelling or signs of fluid overload.  His EKG does not show any ischemic changes.  His labs are nonconcerning.  His D-dimer is negative.  Tanner Chang was tachycardic in the ED but Tanner has improved.  On my reexam, it was 99.  Tanner Chang does not have other symptoms that would be more concerning for PE.  Tanner Chang was given nebulizer treatments and a dose of Decadron.  Tanner Chang is feeling much better after Tanner.  Tanner Chang feels like Tanner Chang is ready to go home.  Chest x-ray does not show any evidence of pneumonia.  Tanner Chang was discharged home in good condition.  Was given a course of steroids and doxycycline .  Tanner Chang was encouraged to follow-up with his PCP.  Return precautions were given.  Pt's BP was a bit high on d/c.  Tanner Chang was advised to have Tanner rechecked by his PCP>  (Labs, imaging, consults)  Labs: I Ordered, and personally interpreted labs.  The pertinent results include: Glucose is mildly elevated  Imaging Studies ordered: I ordered imaging studies including chest x-ray I independently visualized and interpreted imaging. I agree with the radiologist interpretation  Additional history obtained from chart.  External records from outside source obtained and reviewed including prior notes  Cardiac Monitoring: The Tanner Chang was maintained on a cardiac monitor.  If on the cardiac monitor, I personally viewed and interpreted the cardiac monitored which showed an underlying rhythm of: Sinus rhythm  Reevaluation: After the interventions noted above, I reevaluated the Tanner Chang and found that they have :improved  Social Determinants of Health:  none  Disposition: Discharged  to home  Co morbidities that complicate the Tanner Chang evaluation  Past Medical History:  Diagnosis Date   CHF (congestive heart failure) (HCC)    COPD (chronic obstructive pulmonary disease) (HCC)    Hypertension    Long COVID      Medicines Meds ordered Tanner encounter  Medications   ipratropium-albuterol  (DUONEB) 0.5-2.5 (3) MG/3ML nebulizer solution    Dunlap, Angela L: cabinet override   ipratropium-albuterol  (DUONEB) 0.5-2.5 (3) MG/3ML nebulizer solution 3 mL   dexamethasone (DECADRON) injection 10 mg   albuterol  (PROVENTIL ) (2.5 MG/3ML) 0.083% nebulizer solution 5 mg   sodium chloride  0.9 % bolus 500 mL   doxycycline  (VIBRA -TABS) tablet 100 mg   doxycycline  (VIBRAMYCIN ) 100 MG capsule    Sig: Take 1 capsule (100 mg total) by mouth 2 (two) times daily. One po bid x 7 days    Dispense:  14 capsule    Refill:  0   predniSONE  (DELTASONE ) 20 MG tablet    Sig: 2 tabs po daily x 4 days    Dispense:  8 tablet    Refill:  0    I have reviewed the patients home medicines and have made adjustments as needed  Problem List / ED Course: Problem List Items Addressed Tanner Visit       Respiratory   COPD exacerbation (HCC) - Primary   Relevant Medications   predniSONE  (DELTASONE ) 20 MG tablet             Final diagnoses:  COPD exacerbation [facility])    ED Discharge Orders          Ordered    doxycycline  (VIBRAMYCIN ) 100 MG capsule  2 times daily        06/25/23 1759    predniSONE  (DELTASONE ) 20 MG tablet        06/25/23 1759               Hershel Los, MD 06/25/23 2030

## 2023-06-25 NOTE — ED Notes (Signed)
 Pt states he is feeling better, VSS, no acute distress

## 2023-06-25 NOTE — ED Triage Notes (Signed)
 Sob, copd exacerbation. Started last night. Treatments at home helped some but worsening sob Seen 5/22 for similar

## 2024-02-06 ENCOUNTER — Emergency Department (HOSPITAL_BASED_OUTPATIENT_CLINIC_OR_DEPARTMENT_OTHER)

## 2024-02-06 ENCOUNTER — Encounter (HOSPITAL_BASED_OUTPATIENT_CLINIC_OR_DEPARTMENT_OTHER): Payer: Self-pay

## 2024-02-06 ENCOUNTER — Other Ambulatory Visit: Payer: Self-pay

## 2024-02-06 ENCOUNTER — Inpatient Hospital Stay (HOSPITAL_BASED_OUTPATIENT_CLINIC_OR_DEPARTMENT_OTHER)
Admission: EM | Admit: 2024-02-06 | Discharge: 2024-02-09 | DRG: 190 | Disposition: A | Attending: Internal Medicine | Admitting: Internal Medicine

## 2024-02-06 DIAGNOSIS — Z1152 Encounter for screening for COVID-19: Secondary | ICD-10-CM | POA: Diagnosis not present

## 2024-02-06 DIAGNOSIS — U099 Post covid-19 condition, unspecified: Secondary | ICD-10-CM | POA: Diagnosis present

## 2024-02-06 DIAGNOSIS — T501X5A Adverse effect of loop [high-ceiling] diuretics, initial encounter: Secondary | ICD-10-CM | POA: Diagnosis not present

## 2024-02-06 DIAGNOSIS — I5032 Chronic diastolic (congestive) heart failure: Secondary | ICD-10-CM | POA: Diagnosis present

## 2024-02-06 DIAGNOSIS — Z881 Allergy status to other antibiotic agents status: Secondary | ICD-10-CM | POA: Diagnosis not present

## 2024-02-06 DIAGNOSIS — Z880 Allergy status to penicillin: Secondary | ICD-10-CM

## 2024-02-06 DIAGNOSIS — I2489 Other forms of acute ischemic heart disease: Secondary | ICD-10-CM | POA: Diagnosis present

## 2024-02-06 DIAGNOSIS — Z7982 Long term (current) use of aspirin: Secondary | ICD-10-CM | POA: Diagnosis not present

## 2024-02-06 DIAGNOSIS — N179 Acute kidney failure, unspecified: Secondary | ICD-10-CM | POA: Diagnosis not present

## 2024-02-06 DIAGNOSIS — I110 Hypertensive heart disease with heart failure: Secondary | ICD-10-CM | POA: Diagnosis present

## 2024-02-06 DIAGNOSIS — F419 Anxiety disorder, unspecified: Secondary | ICD-10-CM | POA: Diagnosis present

## 2024-02-06 DIAGNOSIS — Z79899 Other long term (current) drug therapy: Secondary | ICD-10-CM | POA: Diagnosis not present

## 2024-02-06 DIAGNOSIS — F32A Depression, unspecified: Secondary | ICD-10-CM | POA: Diagnosis present

## 2024-02-06 DIAGNOSIS — K219 Gastro-esophageal reflux disease without esophagitis: Secondary | ICD-10-CM | POA: Diagnosis present

## 2024-02-06 DIAGNOSIS — R0602 Shortness of breath: Secondary | ICD-10-CM | POA: Diagnosis present

## 2024-02-06 DIAGNOSIS — J441 Chronic obstructive pulmonary disease with (acute) exacerbation: Principal | ICD-10-CM | POA: Diagnosis present

## 2024-02-06 DIAGNOSIS — I2510 Atherosclerotic heart disease of native coronary artery without angina pectoris: Secondary | ICD-10-CM | POA: Diagnosis present

## 2024-02-06 DIAGNOSIS — J9601 Acute respiratory failure with hypoxia: Secondary | ICD-10-CM | POA: Diagnosis present

## 2024-02-06 DIAGNOSIS — M7989 Other specified soft tissue disorders: Secondary | ICD-10-CM

## 2024-02-06 DIAGNOSIS — F10939 Alcohol use, unspecified with withdrawal, unspecified: Secondary | ICD-10-CM | POA: Diagnosis not present

## 2024-02-06 DIAGNOSIS — Z87891 Personal history of nicotine dependence: Secondary | ICD-10-CM | POA: Diagnosis not present

## 2024-02-06 DIAGNOSIS — Z882 Allergy status to sulfonamides status: Secondary | ICD-10-CM

## 2024-02-06 DIAGNOSIS — E785 Hyperlipidemia, unspecified: Secondary | ICD-10-CM | POA: Diagnosis present

## 2024-02-06 DIAGNOSIS — E669 Obesity, unspecified: Secondary | ICD-10-CM | POA: Diagnosis present

## 2024-02-06 LAB — HIV ANTIBODY (ROUTINE TESTING W REFLEX): HIV Screen 4th Generation wRfx: NONREACTIVE

## 2024-02-06 LAB — CBC WITH DIFFERENTIAL/PLATELET
Abs Immature Granulocytes: 0.07 10*3/uL (ref 0.00–0.07)
Basophils Absolute: 0.1 10*3/uL (ref 0.0–0.1)
Basophils Relative: 1 %
Eosinophils Absolute: 0.2 10*3/uL (ref 0.0–0.5)
Eosinophils Relative: 2 %
HCT: 41.3 % (ref 39.0–52.0)
Hemoglobin: 14.6 g/dL (ref 13.0–17.0)
Immature Granulocytes: 1 %
Lymphocytes Relative: 18 %
Lymphs Abs: 1.5 10*3/uL (ref 0.7–4.0)
MCH: 30.7 pg (ref 26.0–34.0)
MCHC: 35.4 g/dL (ref 30.0–36.0)
MCV: 86.8 fL (ref 80.0–100.0)
Monocytes Absolute: 0.8 10*3/uL (ref 0.1–1.0)
Monocytes Relative: 9 %
Neutro Abs: 6 10*3/uL (ref 1.7–7.7)
Neutrophils Relative %: 69 %
Platelets: 269 10*3/uL (ref 150–400)
RBC: 4.76 MIL/uL (ref 4.22–5.81)
RDW: 12.7 % (ref 11.5–15.5)
WBC: 8.6 10*3/uL (ref 4.0–10.5)
nRBC: 0 % (ref 0.0–0.2)

## 2024-02-06 LAB — C-REACTIVE PROTEIN: CRP: 0.6 mg/dL

## 2024-02-06 LAB — COMPREHENSIVE METABOLIC PANEL WITH GFR
ALT: 41 U/L (ref 0–44)
AST: 46 U/L — ABNORMAL HIGH (ref 15–41)
Albumin: 4.7 g/dL (ref 3.5–5.0)
Alkaline Phosphatase: 86 U/L (ref 38–126)
Anion gap: 16 — ABNORMAL HIGH (ref 5–15)
BUN: 20 mg/dL (ref 8–23)
CO2: 25 mmol/L (ref 22–32)
Calcium: 10.1 mg/dL (ref 8.9–10.3)
Chloride: 97 mmol/L — ABNORMAL LOW (ref 98–111)
Creatinine, Ser: 1.18 mg/dL (ref 0.61–1.24)
GFR, Estimated: >60 mL/min
Glucose, Bld: 111 mg/dL — ABNORMAL HIGH (ref 70–99)
Potassium: 3.9 mmol/L (ref 3.5–5.1)
Sodium: 137 mmol/L (ref 135–145)
Total Bilirubin: 1.1 mg/dL (ref 0.0–1.2)
Total Protein: 7.4 g/dL (ref 6.5–8.1)

## 2024-02-06 LAB — RESPIRATORY PANEL BY PCR

## 2024-02-06 LAB — I-STAT VENOUS BLOOD GAS, ED
Acid-Base Excess: 2 mmol/L (ref 0.0–2.0)
Bicarbonate: 25.9 mmol/L (ref 20.0–28.0)
Calcium, Ion: 1.11 mmol/L — ABNORMAL LOW (ref 1.15–1.40)
HCT: 42 % (ref 39.0–52.0)
Hemoglobin: 14.3 g/dL (ref 13.0–17.0)
O2 Saturation: 97 %
Patient temperature: 98.3
Potassium: 3.8 mmol/L (ref 3.5–5.1)
Sodium: 136 mmol/L (ref 135–145)
TCO2: 27 mmol/L (ref 22–32)
pCO2, Ven: 35.3 mmHg — ABNORMAL LOW (ref 44–60)
pH, Ven: 7.474 — ABNORMAL HIGH (ref 7.25–7.43)
pO2, Ven: 88 mmHg — ABNORMAL HIGH (ref 32–45)

## 2024-02-06 LAB — CBC
HCT: 42 % (ref 39.0–52.0)
Hemoglobin: 14.6 g/dL (ref 13.0–17.0)
MCH: 30.4 pg (ref 26.0–34.0)
MCHC: 34.8 g/dL (ref 30.0–36.0)
MCV: 87.5 fL (ref 80.0–100.0)
Platelets: 265 10*3/uL (ref 150–400)
RBC: 4.8 MIL/uL (ref 4.22–5.81)
RDW: 12.6 % (ref 11.5–15.5)
WBC: 6 10*3/uL (ref 4.0–10.5)
nRBC: 0 % (ref 0.0–0.2)

## 2024-02-06 LAB — RESP PANEL BY RT-PCR (RSV, FLU A&B, COVID)  RVPGX2
Influenza A by PCR: NEGATIVE
Influenza B by PCR: NEGATIVE
Resp Syncytial Virus by PCR: NEGATIVE
SARS Coronavirus 2 by RT PCR: NEGATIVE

## 2024-02-06 LAB — PROCALCITONIN: Procalcitonin: 0.13 ng/mL

## 2024-02-06 LAB — PRO BRAIN NATRIURETIC PEPTIDE: Pro Brain Natriuretic Peptide: 78.2 pg/mL

## 2024-02-06 LAB — CREATININE, SERUM
Creatinine, Ser: 1.39 mg/dL — ABNORMAL HIGH (ref 0.61–1.24)
GFR, Estimated: 56 mL/min — ABNORMAL LOW

## 2024-02-06 LAB — TROPONIN T, HIGH SENSITIVITY
Troponin T High Sensitivity: 46 ng/L — ABNORMAL HIGH (ref 0–19)
Troponin T High Sensitivity: 40 ng/L — ABNORMAL HIGH (ref 0–19)

## 2024-02-06 MED ORDER — ENOXAPARIN SODIUM 40 MG/0.4ML IJ SOSY
40.0000 mg | PREFILLED_SYRINGE | INTRAMUSCULAR | Status: DC
  Administered 2024-02-06 – 2024-02-08 (×3): 40 mg via SUBCUTANEOUS
  Filled 2024-02-06 (×4): qty 0.4

## 2024-02-06 MED ORDER — AZITHROMYCIN 250 MG PO TABS
500.0000 mg | ORAL_TABLET | Freq: Every day | ORAL | Status: DC
  Administered 2024-02-06 – 2024-02-09 (×4): 500 mg via ORAL
  Filled 2024-02-06 (×4): qty 2

## 2024-02-06 MED ORDER — UMECLIDINIUM BROMIDE 62.5 MCG/ACT IN AEPB
1.0000 | INHALATION_SPRAY | Freq: Every day | RESPIRATORY_TRACT | Status: DC
  Administered 2024-02-06 – 2024-02-09 (×4): 1 via RESPIRATORY_TRACT
  Filled 2024-02-06: qty 7

## 2024-02-06 MED ORDER — PROMETHAZINE HCL 25 MG PO TABS: 12.5000 mg | ORAL_TABLET | Freq: Four times a day (QID) | ORAL | Status: DC | PRN

## 2024-02-06 MED ORDER — BUPROPION HCL ER (XL) 300 MG PO TB24
300.0000 mg | ORAL_TABLET | Freq: Every day | ORAL | Status: DC
  Administered 2024-02-07 – 2024-02-09 (×3): 300 mg via ORAL
  Filled 2024-02-06 (×4): qty 1

## 2024-02-06 MED ORDER — MAGNESIUM SULFATE 2 GM/50ML IV SOLN
2.0000 g | Freq: Once | INTRAVENOUS | Status: AC
  Administered 2024-02-06: 2 g via INTRAVENOUS
  Filled 2024-02-06: qty 50

## 2024-02-06 MED ORDER — ALBUTEROL SULFATE (2.5 MG/3ML) 0.083% IN NEBU: 2.5000 mg | INHALATION_SOLUTION | Freq: Four times a day (QID) | RESPIRATORY_TRACT | Status: DC | PRN

## 2024-02-06 MED ORDER — ALBUTEROL SULFATE (2.5 MG/3ML) 0.083% IN NEBU
2.5000 mg | INHALATION_SOLUTION | RESPIRATORY_TRACT | Status: DC
  Administered 2024-02-06: 2.5 mg via RESPIRATORY_TRACT
  Filled 2024-02-06: qty 3

## 2024-02-06 MED ORDER — SENNOSIDES-DOCUSATE SODIUM 8.6-50 MG PO TABS: 1.0000 | ORAL_TABLET | Freq: Every evening | ORAL | Status: DC | PRN

## 2024-02-06 MED ORDER — PANTOPRAZOLE SODIUM 40 MG PO TBEC
40.0000 mg | DELAYED_RELEASE_TABLET | Freq: Every day | ORAL | Status: DC
  Administered 2024-02-06 – 2024-02-09 (×4): 40 mg via ORAL
  Filled 2024-02-06 (×4): qty 1

## 2024-02-06 MED ORDER — IPRATROPIUM-ALBUTEROL 0.5-2.5 (3) MG/3ML IN SOLN
3.0000 mL | Freq: Once | RESPIRATORY_TRACT | Status: AC
  Administered 2024-02-06: 3 mL via RESPIRATORY_TRACT
  Filled 2024-02-06: qty 3

## 2024-02-06 MED ORDER — LOSARTAN POTASSIUM 25 MG PO TABS
25.0000 mg | ORAL_TABLET | Freq: Every morning | ORAL | Status: DC
  Administered 2024-02-07: 25 mg via ORAL
  Filled 2024-02-06: qty 1

## 2024-02-06 MED ORDER — METHYLPREDNISOLONE SODIUM SUCC 125 MG IJ SOLR
80.0000 mg | INTRAMUSCULAR | Status: DC
  Administered 2024-02-07 – 2024-02-09 (×3): 80 mg via INTRAVENOUS
  Filled 2024-02-06 (×3): qty 2

## 2024-02-06 MED ORDER — ATORVASTATIN CALCIUM 10 MG PO TABS
10.0000 mg | ORAL_TABLET | Freq: Every day | ORAL | Status: DC
  Administered 2024-02-06 – 2024-02-08 (×3): 10 mg via ORAL
  Filled 2024-02-06 (×3): qty 1

## 2024-02-06 MED ORDER — ACETAMINOPHEN 500 MG PO TABS: 500.0000 mg | ORAL_TABLET | Freq: Four times a day (QID) | ORAL | Status: DC | PRN

## 2024-02-06 MED ORDER — TIOTROPIUM BROMIDE 2.5 MCG/ACT IN AERS: 2.0000 | INHALATION_SPRAY | Freq: Every day | RESPIRATORY_TRACT | Status: DC

## 2024-02-06 MED ORDER — CALCIUM GLUCONATE-NACL 1-0.675 GM/50ML-% IV SOLN
1.0000 g | Freq: Once | INTRAVENOUS | Status: AC
  Administered 2024-02-06: 1000 mg via INTRAVENOUS
  Filled 2024-02-06: qty 50

## 2024-02-06 MED ORDER — ASPIRIN 81 MG PO TBEC
81.0000 mg | DELAYED_RELEASE_TABLET | Freq: Every day | ORAL | Status: DC
  Administered 2024-02-06 – 2024-02-09 (×4): 81 mg via ORAL
  Filled 2024-02-06 (×4): qty 1

## 2024-02-06 MED ORDER — IPRATROPIUM-ALBUTEROL 0.5-2.5 (3) MG/3ML IN SOLN
RESPIRATORY_TRACT | Status: AC
  Administered 2024-02-06: 3 mL
  Filled 2024-02-06: qty 3

## 2024-02-06 MED ORDER — IOHEXOL 350 MG/ML SOLN
75.0000 mL | Freq: Once | INTRAVENOUS | Status: AC | PRN
  Administered 2024-02-06: 75 mL via INTRAVENOUS

## 2024-02-06 MED ORDER — ALBUTEROL SULFATE (2.5 MG/3ML) 0.083% IN NEBU: 2.5000 mg | INHALATION_SOLUTION | RESPIRATORY_TRACT | Status: DC | PRN

## 2024-02-06 MED ORDER — IPRATROPIUM-ALBUTEROL 0.5-2.5 (3) MG/3ML IN SOLN: 3.0000 mL | Freq: Once | RESPIRATORY_TRACT | Status: DC

## 2024-02-06 MED ORDER — ISOSORBIDE MONONITRATE ER 30 MG PO TB24
30.0000 mg | ORAL_TABLET | Freq: Every day | ORAL | Status: DC
  Administered 2024-02-07: 30 mg via ORAL
  Filled 2024-02-06: qty 1

## 2024-02-06 MED ORDER — FLUOXETINE HCL 20 MG PO CAPS
20.0000 mg | ORAL_CAPSULE | Freq: Every morning | ORAL | Status: DC
  Administered 2024-02-07 – 2024-02-09 (×3): 20 mg via ORAL
  Filled 2024-02-06 (×3): qty 1

## 2024-02-06 MED ORDER — IPRATROPIUM-ALBUTEROL 0.5-2.5 (3) MG/3ML IN SOLN
3.0000 mL | Freq: Four times a day (QID) | RESPIRATORY_TRACT | Status: DC
  Administered 2024-02-06 – 2024-02-08 (×7): 3 mL via RESPIRATORY_TRACT
  Filled 2024-02-06 (×8): qty 3

## 2024-02-06 MED ORDER — FUROSEMIDE 10 MG/ML IJ SOLN
40.0000 mg | Freq: Once | INTRAMUSCULAR | Status: AC
  Administered 2024-02-06: 40 mg via INTRAVENOUS
  Filled 2024-02-06: qty 4

## 2024-02-06 MED ORDER — HYDRALAZINE HCL 20 MG/ML IJ SOLN: 10.0000 mg | Freq: Four times a day (QID) | INTRAMUSCULAR | Status: DC | PRN

## 2024-02-06 MED ORDER — METHYLPREDNISOLONE SODIUM SUCC 125 MG IJ SOLR
125.0000 mg | Freq: Once | INTRAMUSCULAR | Status: AC
  Administered 2024-02-06: 125 mg via INTRAVENOUS
  Filled 2024-02-06: qty 2

## 2024-02-06 NOTE — ED Notes (Signed)
 Pt placed on 2L NC

## 2024-02-06 NOTE — ED Provider Notes (Signed)
 "  EMERGENCY DEPARTMENT AT [facility] Provider Note   CSN: [id number] Arrival date & time: 02/06/24  9048     Patient presents with: No chief complaint on file.   Tanner Chang is a 69 y.o. male.   Patient here with shortness of breath ongoing the last week.  Was given Lasix  prescription yesterday at the [facility].  He has a history of hypertension COPD CHF but he was not on any treatment for CHF until he was given a prescription for Lasix  yesterday.  He does have history of long COVID COPD.  Breathing treatments have not really helped much at home either.  Former smoker.  She has noticed leg swelling.  He is short of breath with exertion.  Not having any chest pain cough or sputum production.  No blood clot history.  The history is provided by the patient.       Prior to Admission medications  Medication Sig Start Date End Date Taking? Authorizing Provider  acetaminophen  (TYLENOL ) 500 MG tablet Take 1,000 mg by mouth every 6 (six) hours as needed for mild pain or headache.    [provider]  albuterol  (PROVENTIL ) (2.5 MG/3ML) 0.083% nebulizer solution Take 3 mLs (2.5 mg total) by nebulization every 6 (six) hours as needed for wheezing or shortness of breath. 06/29/20   Amiaya Mcneeley, DO  albuterol  (VENTOLIN  HFA) 108 (90 Base) MCG/ACT inhaler Inhale 2 puffs into the lungs every 4 (four) hours as needed for wheezing or shortness of breath. 12/14/19   Schuyler Charlie RAMAN, MD  Aspirin -Salicylamide-Caffeine (BC HEADACHE POWDER PO) Take 2 packets by mouth daily as needed (headaches).    [provider]  atorvastatin  (LIPITOR) 10 MG tablet Take 10 mg by mouth at bedtime.    [provider]  buPROPion  (WELLBUTRIN  XL) 300 MG 24 hr tablet Take 300 mg by mouth daily with breakfast.    [provider]  doxycycline  (VIBRAMYCIN ) 100 MG capsule Take 1 capsule (100 mg total) by mouth 2 (two) times daily. One po bid x 7 days 06/25/23   Lenor Hollering, MD   FLUoxetine  (PROZAC ) 20 MG capsule Take 20 mg by mouth in the morning.    [provider]  fluticasone -salmeterol (WIXELA INHUB) 500-50 MCG/ACT AEPB Inhale 1 puff into the lungs in the morning and at bedtime. 10/01/21   Hunsucker, Donnice JONELLE, MD  furosemide  (LASIX ) 40 MG tablet Take 1 tablet (40 mg total) by mouth daily. 03/27/20   Rojelio Nest, DO  Ipratropium-Albuterol  (COMBIVENT  RESPIMAT) 20-100 MCG/ACT AERS respimat Inhale 2 puffs into the lungs daily.    [provider]  losartan  (COZAAR ) 100 MG tablet Take 100 mg by mouth in the morning.    [provider]  Multiple Vitamins-Minerals (ONE-A-DAY MENS 50+ ADVANTAGE) TABS Take 1 tablet by mouth daily with breakfast.    [provider]  omeprazole (PRILOSEC) 20 MG capsule Take 20 mg by mouth daily before breakfast.    [provider]  OVER THE COUNTER MEDICATION Take 1 tablet by mouth daily. Vitamin d , vitamin c, zinc combo vitamin    [provider]  Oxcarbazepine  (TRILEPTAL ) 300 MG tablet Take 300 mg by mouth 2 (two) times daily.    [provider]  potassium chloride  SA (KLOR-CON ) 20 MEQ tablet Take 1 tablet (20 mEq total) by mouth daily. 05/02/20   Freddi Hamilton, MD  predniSONE  (DELTASONE ) 20 MG tablet 2 tabs po daily x 4 days 06/25/23   Lenor Hollering, MD  Psyllium (  METAMUCIL PO) Take by mouth See admin instructions. Mix 1 tablespoonful of powder into 8 ounces of water and drink once a day as needed for constipation    [provider]  Tiotropium Bromide  Monohydrate (SPIRIVA  RESPIMAT) 2.5 MCG/ACT AERS Inhale 2 puffs into the lungs daily. 10/01/21   Hunsucker, Donnice SAUNDERS, MD    Allergies: Bactrim [sulfamethoxazole-trimethoprim] and Penicillins    Review of Systems  Updated Vital Signs BP (!) 113/92   Pulse (!) 104   Temp 98.3 F (36.8 C)   Resp (!) 22   SpO2 96%   Physical Exam Vitals and nursing note reviewed.  Constitutional:      General: He is not in acute  distress.    Appearance: He is well-developed. He is not ill-appearing.  HENT:     Head: Normocephalic and atraumatic.     Nose: Nose normal.     Mouth/Throat:     Mouth: Mucous membranes are moist.  Eyes:     Extraocular Movements: Extraocular movements intact.     Conjunctiva/sclera: Conjunctivae normal.     Pupils: Pupils are equal, round, and reactive to light.  Cardiovascular:     Rate and Rhythm: Normal rate and regular rhythm.     Heart sounds: No murmur heard. Pulmonary:     Comments: Diminished breath sounds throughout with poor breath sounds/poor air movement, patient is at increased work of breathing Abdominal:     Palpations: Abdomen is soft.     Tenderness: There is no abdominal tenderness.  Musculoskeletal:        General: No swelling.     Cervical back: Neck supple.     Right lower leg: Edema present.     Left lower leg: Edema present.  Skin:    General: Skin is warm and dry.     Capillary Refill: Capillary refill takes less than 2 seconds.  Neurological:     General: No focal deficit present.     Mental Status: He is alert and oriented to person, place, and time.     Cranial Nerves: No cranial nerve deficit.     Sensory: No sensory deficit.     Motor: No weakness.     Coordination: Coordination normal.  Psychiatric:        Mood and Affect: Mood normal.     (all labs ordered are listed, but only abnormal results are displayed) Labs Reviewed  COMPREHENSIVE METABOLIC PANEL WITH GFR - Abnormal; Notable for the following components:      Result Value   Chloride 97 (*)    Glucose, Bld 111 (*)    AST 46 (*)    Anion gap 16 (*)    All other components within normal limits  I-STAT VENOUS BLOOD GAS, ED - Abnormal; Notable for the following components:   pH, Ven 7.474 (*)    pCO2, Ven 35.3 (*)    pO2, Ven 88 (*)    Calcium , Ion 1.11 (*)    All other components within normal limits  TROPONIN T, HIGH SENSITIVITY - Abnormal; Notable for the following  components:   Troponin T High Sensitivity 46 (*)    All other components within normal limits  RESP PANEL BY RT-PCR (RSV, FLU A&B, COVID)  [id number]  CBC WITH DIFFERENTIAL/PLATELET  PRO BRAIN NATRIURETIC PEPTIDE  TROPONIN T, HIGH SENSITIVITY    EKG: EKG Interpretation Date/Time:  Saturday February 06 2024 10:06:39 EST Ventricular Rate:  107 PR Interval:  104 QRS Duration:  94 QT Interval:  327  QTC Calculation: 437 R Axis:   83  Text Interpretation: Sinus tachycardia , artifcat Borderline right axis deviation Low voltage, precordial leads Baseline wander in lead(s) III aVF Confirmed by Ruthe Cornet [phone]) on 02/06/2024 10:16:16 AM  Radiology: CT Angio Chest PE W and/or Wo Contrast Result Date: 02/06/2024 EXAM: CTA of the Chest with contrast for PE 02/06/2024 11:21:15 AM TECHNIQUE: CTA of the chest was performed after the administration of intravenous contrast. Multiplanar reformatted images are provided for review. MIP images are provided for review. Automated exposure control, iterative reconstruction, and/or weight based adjustment of the mA/kV was utilized to reduce the radiation dose to as low as reasonably achievable. COMPARISON: CTA chest 05/05/2023, chest x-ray 02/06/2024. CLINICAL HISTORY: 69 year old male. Pulmonary embolism (PE) suspected, high probability. Progressive shortness of breath and weakness, abnormal left lung base auscultation, recently started on Lasix . FINDINGS: PULMONARY ARTERIES: Pulmonary arteries are adequately opacified for evaluation. Mild mixing artifact in the main pulmonary arteries but good pulmonary artery contrast timing. No pulmonary embolism. Main pulmonary artery is normal in caliber. MEDIASTINUM: The heart demonstrates calcified coronary artery atherosclerosis. No pericardial effusion. The pericardium demonstrates no acute abnormality. The thoracic aorta is portuous with moderate aortic atherosclerosis. There is no acute abnormality of the thoracic aorta.  LYMPH NODES: No mediastinal, hilar or axillary lymphadenopathy. LUNGS AND PLEURA: Improved lung volumes. Chronic mild elevation of the left hemidiaphragm is stable. Minor left lung base atelectasis. Subtle upper lobe predominant centrilobular emphysema suspected. No pleural effusion or pneumothorax. UPPER ABDOMEN: Limited images of the upper abdomen are unremarkable. SOFT TISSUES AND BONES: Superficial posterior dermal or subdermal sebaceous type cyst incidentally noted. Stable chronic left lateral 6 through 8 rib fractures. Also stable chronic right posterior and lateral rib fractures. IMPRESSION: 1. No pulmonary embolism. 2. Possible emphysema. No acute or inflammatory process identified in the chest. 3. Aortic Atherosclerosis (ICD10-I70.0). Electronically signed by: Helayne Hurst MD 02/06/2024 11:55 AM EST RP Workstation: [id number]   DG Chest Portable 1 View Result Date: 02/06/2024 EXAM: 1 VIEW(S) XRAY OF THE CHEST 02/06/2024 10:12:45 AM COMPARISON: 06/25/2023 CLINICAL HISTORY: Shortness of breath. FINDINGS: LUNGS AND PLEURA: Mild elevation of left hemidiaphragm. No focal pulmonary opacity. No pleural effusion. No pneumothorax. HEART AND MEDIASTINUM: Aortic arch atherosclerotic calcifications. No acute abnormality of the cardiac and mediastinal silhouettes. BONES AND SOFT TISSUES: Remote bilateral rib fractures. IMPRESSION: 1. No acute cardiopulmonary abnormality. Electronically signed by: Katheleen Faes MD 02/06/2024 11:21 AM EST RP Workstation: [id number]     Procedures   Medications Ordered in the ED  ipratropium-albuterol  (DUONEB) 0.5-2.5 (3) MG/3ML nebulizer solution 3 mL ( Nebulization Canceled Entry 02/06/24 1006)  magnesium  sulfate IVPB 2 g 50 mL (has no administration in time range)  albuterol  (PROVENTIL ) (2.5 MG/3ML) 0.083% nebulizer solution 2.5 mg (has no administration in time range)  ipratropium-albuterol  (DUONEB) 0.5-2.5 (3) MG/3ML nebulizer solution (3 mLs  Given 02/06/24 1006)   ipratropium-albuterol  (DUONEB) 0.5-2.5 (3) MG/3ML nebulizer solution 3 mL (3 mLs Nebulization Given 02/06/24 1102)  methylPREDNISolone  sodium succinate (SOLU-MEDROL ) 125 mg/2 mL injection 125 mg (125 mg Intravenous Given 02/06/24 1109)  iohexol  (OMNIPAQUE ) 350 MG/ML injection 75 mL (75 mLs Intravenous Contrast Given 02/06/24 1121)                                    Medical Decision Making Amount and/or Complexity of Data Reviewed Labs: ordered. Radiology: ordered.  Risk Prescription drug management.   ZIQUAN FIDEL is  here with redness of breath progressive over the last week.  Very short of breath with exertion.  He has had some increased leg swelling.  He was given a Lasix  prescription at the [facility] clinic yesterday.  He has no fevers mildly tachycardic he is increased work of breathing diminished breath sounds with poor air movement throughout.  He does have edema on his legs.  Differential diagnosis could be volume overload could be ACS could be COPD exacerbation pneumonia PE.  Will get lab work including troponin and BNP chest x-ray.  EKG shows sinus tachycardia per my review and interpretation.  Opponent mildly elevated at 46.  proBNP is 78.  COVID flu RSV test negative.  Chest x-ray showed no obvious pneumonia pneumothorax or volume overload.  I pursued a CT scan to further evaluate which showed no pulmonary embolism.  Did show possible emphysema changes.  But otherwise no acute findings no pneumonia.  Blood gas overall reassuring.  Little bit alkalotic probably from his increased work of breathing.  But is not retaining CO2 at this time.  He got breathing treatments magnesium  steroids with some improvement but does still have a lot of increased work of breathing on exam.  He does not look safe enough to go home.  I do not think he needs BiPAP but I think we should observe him in the hospital overnight given breathing treatments and steroids and further supportive care.  Will continue to  trend troponin.  Is not having any chest pain.  Seems less likely to be ACS.  I think he is benefit from echocardiogram.  Not sure if there is some pulmonary hypertension going on as well.  Will give him a dose of Lasix  given the peripheral edema as well.  Will admit to hospitalist further care.  This chart was dictated using voice recognition software.  Despite best efforts to proofread,  errors can occur which can change the documentation meaning.      Final diagnoses:  COPD exacerbation [facility])  Leg swelling    ED Discharge Orders     None          Ruthe Cornet, DO 02/06/24 1209  "

## 2024-02-06 NOTE — ED Notes (Signed)
 Pt ambulated on room air, lowest Sp02: 91%, recovered to 94% post walk.

## 2024-02-06 NOTE — Consult Note (Signed)
 "  Cardiology Consultation   Patient ID: DIJUAN SLEETH MRN: [id number]; DOB: 12-23-1955  Admit date: 02/06/2024 Date of Consult: 02/06/2024  PCP:  [facility] Providers Cardiologist:  None     [facility]   Patient Profile: JIMEL MYLER is a 69 y.o. male with a hx of COPD not requiring home O2, chronic diastolic heart failure with historically preserved EF, hypertension, hyperlipidemia, anxiety, and prior tobacco abuse who is being seen 02/06/2024 for the evaluation of shortness of breath concern for anginal equivalent at the request of Dr. Dennise.  History of Present Illness: Mr. Yusuf is a 69 year old male who sees cardiology at the [facility] in [place].  Patient was in his usual state of health up until about 2 weeks ago and started to feel fatigued with generalized weakness along with exertional shortness of breath and some wheezing.  He tells me he had an echocardiogram 2 weeks ago and was not informed of any abnormalities.  He saw his PCP who prescribed him some Lasix  which did not provide relief and he presented to the ED today.  CT PE study performed in the ED which showed no pulmonary embolism but does demonstrate three-vessel coronary artery calcifications.  Current presentation felt to be due to COPD exacerbation.  However primary service felt a cardiology evaluation was warranted to ensure this is not an anginal equivalent.  I am seeing the patient upon his arrival to [facility] and he is sitting up in good spirits on the side of the bed with no dyspnea and no complaints.  He feels much better after treatment with IV Solu-Medrol  as well as oral azithromycin  and nebulizer treatments.  He is no longer wheezing.  He also received a dose of Lasix  in the ED.  Telemetry demonstrates sinus tachycardia with rate about 115.  EKG demonstrates sinus tachycardia and low voltage.  No ST-T wave changes to suggest ischemia.  Pertinent laboratory studies  include normal sodium and potassium, creatinine 1.18, high-sensitivity troponin 46 then 40 downtrending.  Normal proBNP.  Normal CBC.  Past Medical History:  Diagnosis Date   CHF (congestive heart failure) (HCC)    COPD (chronic obstructive pulmonary disease) (HCC)    Hypertension    Long COVID     Past Surgical History:  Procedure Laterality Date   BACK SURGERY  1990     Home Medications:  Prior to Admission medications  Medication Sig Start Date End Date Taking? Authorizing Provider  acetaminophen  (TYLENOL ) 500 MG tablet Take 1,000 mg by mouth every 6 (six) hours as needed for mild pain or headache.   Yes [provider]  albuterol  (PROVENTIL ) (2.5 MG/3ML) 0.083% nebulizer solution Take 3 mLs (2.5 mg total) by nebulization every 6 (six) hours as needed for wheezing or shortness of breath. 06/29/20  Yes Curatolo, Adam, DO  albuterol  (VENTOLIN  HFA) 108 (90 Base) MCG/ACT inhaler Inhale 2 puffs into the lungs every 4 (four) hours as needed for wheezing or shortness of breath. 12/14/19  Yes Dykstra, Charlie RAMAN, MD  Aspirin -Salicylamide-Caffeine (BC HEADACHE POWDER PO) Take 2 packets by mouth daily as needed (headaches).   Yes [provider]  atorvastatin  (LIPITOR) 10 MG tablet Take 10 mg by mouth at bedtime.   Yes [provider]  buPROPion  (WELLBUTRIN  XL) 300 MG 24 hr tablet Take 300 mg by mouth daily with breakfast.   Yes [provider]  doxycycline  (VIBRAMYCIN ) 100 MG capsule Take 1 capsule (100 mg total) by mouth  2 (two) times daily. One po bid x 7 days 06/25/23  Yes Lenor Hollering, MD  FLUoxetine  (PROZAC ) 20 MG capsule Take 20 mg by mouth in the morning.   Yes [provider]  fluticasone -salmeterol (WIXELA INHUB) 500-50 MCG/ACT AEPB Inhale 1 puff into the lungs in the morning and at bedtime. 10/01/21  Yes Hunsucker, Donnice SAUNDERS, MD  furosemide  (LASIX ) 40 MG tablet Take 1 tablet (40 mg total) by mouth daily. 03/27/20  Yes Rojelio Nest, DO   Ipratropium-Albuterol  (COMBIVENT  RESPIMAT) 20-100 MCG/ACT AERS respimat Inhale 2 puffs into the lungs daily.   Yes [provider]  losartan  (COZAAR ) 100 MG tablet Take 100 mg by mouth in the morning.   Yes [provider]  Multiple Vitamins-Minerals (ONE-A-DAY MENS 50+ ADVANTAGE) TABS Take 1 tablet by mouth daily with breakfast.   Yes [provider]  omeprazole (PRILOSEC) 20 MG capsule Take 20 mg by mouth daily before breakfast.   Yes [provider]  OVER THE COUNTER MEDICATION Take 1 tablet by mouth daily. Vitamin d , vitamin c, zinc combo vitamin   Yes [provider]  Oxcarbazepine  (TRILEPTAL ) 300 MG tablet Take 300 mg by mouth 2 (two) times daily.   Yes [provider]  potassium chloride  SA (KLOR-CON ) 20 MEQ tablet Take 1 tablet (20 mEq total) by mouth daily. 05/02/20  Yes Freddi Hamilton, MD  predniSONE  (DELTASONE ) 20 MG tablet 2 tabs po daily x 4 days 06/25/23  Yes Lenor Hollering, MD  Psyllium (METAMUCIL PO) Take by mouth See admin instructions. Mix 1 tablespoonful of powder into 8 ounces of water and drink once a day as needed for constipation   Yes [provider]  Tiotropium Bromide  Monohydrate (SPIRIVA  RESPIMAT) 2.5 MCG/ACT AERS Inhale 2 puffs into the lungs daily. 10/01/21  Yes Hunsucker, Donnice SAUNDERS, MD    Scheduled Meds:  aspirin  EC  81 mg Oral Daily   atorvastatin   10 mg Oral QHS   azithromycin   500 mg Oral Daily   [START ON 02/07/2024] buPROPion   300 mg Oral Q breakfast   enoxaparin  (LOVENOX ) injection  40 mg Subcutaneous Q24H   [START ON 02/07/2024] FLUoxetine   20 mg Oral q AM   ipratropium-albuterol   3 mL Nebulization Q6H   isosorbide  mononitrate  30 mg Oral Daily   [START ON 02/07/2024] losartan   25 mg Oral q AM   [START ON 02/07/2024] methylPREDNISolone  (SOLU-MEDROL ) injection  80 mg Intravenous Q24H   pantoprazole   40 mg Oral Daily   umeclidinium bromide   1 puff Inhalation Daily   Continuous Infusions:  PRN  Meds: acetaminophen , albuterol , hydrALAZINE , promethazine , senna-docusate  Allergies:   Allergies[1]  Social History:   Social History   Socioeconomic History   Marital status: Divorced    Spouse name: Not on file   Number of children: Not on file   Years of education: Not on file   Highest education level: Not on file  Occupational History   Not on file  Tobacco Use   Smoking status: Former    Current packs/day: 0.00    Average packs/day: 2.0 packs/day for 20.0 years (40.0 ttl pk-yrs)    Types: Cigarettes    Start date: 06/1998    Quit date: 06/2018    Years since quitting: 5.6    Passive exposure: Past   Smokeless tobacco: Former  Substance and Sexual Activity   Alcohol  use: Yes   Drug use: Not Currently   Sexual activity: Not Currently  Other Topics Concern   Not on  file  Social History Narrative   Not on file   Social Drivers of Health   Tobacco Use: Medium Risk (02/06/2024)   Patient History    Smoking Tobacco Use: Former    Smokeless Tobacco Use: Former    Passive Exposure: Past  Programmer, Applications: Not on [employer] Insecurity: Not on file  Transportation Needs: Not on file  Physical Activity: Not on file  Stress: Not on file  Social Connections: Unknown (05/27/2021)   [facility]   Social Network    Social Network: Not on file  Intimate Partner Violence: Unknown (04/18/2021)   Received from [facility]   HITS    Physically Hurt: Not on file    Insult or Talk Down To: Not on file    Threaten Physical Harm: Not on file    Scream or Curse: Not on file  Depression (EYV7-0): Not on file  Alcohol  Screen: Not on file  Housing: Not on file  Utilities: Not on file  Health Literacy: Not on file    Family History:   History reviewed. No pertinent family history.   ROS:  Please see the history of present illness.   All other ROS reviewed and negative.     Physical Exam/Data: Vitals:   02/06/24 1400 02/06/24 1432 02/06/24 1543  02/06/24 1550  BP: (!) 118/95  102/76 115/72  Pulse: (!) 110  (!) 108   Resp: 19     Temp:   98.8 F (37.1 C)   TempSrc:   Oral   SpO2: 91% 94% 93%     Intake/Output Summary (Last 24 hours) at 02/06/2024 1713 Last data filed at 02/06/2024 1421 Gross per 24 hour  Intake 92.88 ml  Output --  Net 92.88 ml      07/10/2021    2:30 PM 03/26/2021    4:23 PM 10/29/2020    3:24 PM  Last 3 Weights  Weight (lbs) 194 lb 12.8 oz 193 lb 184 lb  Weight (kg) 88.361 kg 87.544 kg 83.462 kg     There is no height or weight on file to calculate BMI.  General:  Well nourished, well developed, in no acute distress HEENT: normal Neck: no JVD Vascular: No carotid bruits; Distal pulses 2+ bilaterally Cardiac:  normal S1, S2; RRR; no murmur  Lungs:  clear to auscultation bilaterally, no wheezing, rhonchi or rales  Abd: soft, nontender, no hepatomegaly  Ext: no edema Musculoskeletal:  No deformities, BUE and BLE strength normal and equal Skin: warm and dry  Neuro:  CNs 2-12 intact, no focal abnormalities noted Psych:  Normal affect   EKG:  The EKG was personally reviewed and demonstrates: Sinus tachycardia Telemetry:  Telemetry was personally reviewed and demonstrates: Sinus tachycardia  Relevant CV Studies: None currently  Laboratory Data: High Sensitivity Troponin:  No results for input(s): TROPONINIHS in the last 720 hours.  Recent Labs  Lab 02/06/24 1011 02/06/24 1153  TRNPT 46* 40*      Chemistry Recent Labs  Lab 02/06/24 1011 02/06/24 1110  NA 137 136  K 3.9 3.8  CL 97*  --   CO2 25  --   GLUCOSE 111*  --   BUN 20  --   CREATININE 1.18  --   CALCIUM  10.1  --   GFRNONAA >60  --   ANIONGAP 16*  --     Recent Labs  Lab 02/06/24 1011  PROT 7.4  ALBUMIN 4.7  AST 46*  ALT 41  ALKPHOS 86  BILITOT 1.1   Lipids No results for input(s): CHOL, TRIG, HDL, LABVLDL, LDLCALC, CHOLHDL in the last 168 hours.  Hematology Recent Labs  Lab 02/06/24 1011  02/06/24 1110  WBC 8.6  --   RBC 4.76  --   HGB 14.6 14.3  HCT 41.3 42.0  MCV 86.8  --   MCH 30.7  --   MCHC 35.4  --   RDW 12.7  --   PLT 269  --    Thyroid No results for input(s): TSH, FREET4 in the last 168 hours.  BNP Recent Labs  Lab 02/06/24 1011  PROBNP 78.2    DDimer No results for input(s): DDIMER in the last 168 hours.  Radiology/Studies:  CT Angio Chest PE W and/or Wo Contrast Result Date: 02/06/2024 EXAM: CTA of the Chest with contrast for PE 02/06/2024 11:21:15 AM TECHNIQUE: CTA of the chest was performed after the administration of intravenous contrast. Multiplanar reformatted images are provided for review. MIP images are provided for review. Automated exposure control, iterative reconstruction, and/or weight based adjustment of the mA/kV was utilized to reduce the radiation dose to as low as reasonably achievable. COMPARISON: CTA chest 05/05/2023, chest x-ray 02/06/2024. CLINICAL HISTORY: 69 year old male. Pulmonary embolism (PE) suspected, high probability. Progressive shortness of breath and weakness, abnormal left lung base auscultation, recently started on Lasix . FINDINGS: PULMONARY ARTERIES: Pulmonary arteries are adequately opacified for evaluation. Mild mixing artifact in the main pulmonary arteries but good pulmonary artery contrast timing. No pulmonary embolism. Main pulmonary artery is normal in caliber. MEDIASTINUM: The heart demonstrates calcified coronary artery atherosclerosis. No pericardial effusion. The pericardium demonstrates no acute abnormality. The thoracic aorta is portuous with moderate aortic atherosclerosis. There is no acute abnormality of the thoracic aorta. LYMPH NODES: No mediastinal, hilar or axillary lymphadenopathy. LUNGS AND PLEURA: Improved lung volumes. Chronic mild elevation of the left hemidiaphragm is stable. Minor left lung base atelectasis. Subtle upper lobe predominant centrilobular emphysema suspected. No pleural effusion or  pneumothorax. UPPER ABDOMEN: Limited images of the upper abdomen are unremarkable. SOFT TISSUES AND BONES: Superficial posterior dermal or subdermal sebaceous type cyst incidentally noted. Stable chronic left lateral 6 through 8 rib fractures. Also stable chronic right posterior and lateral rib fractures. IMPRESSION: 1. No pulmonary embolism. 2. Possible emphysema. No acute or inflammatory process identified in the chest. 3. Aortic Atherosclerosis (ICD10-I70.0). Electronically signed by: Helayne Hurst MD 02/06/2024 11:55 AM EST RP Workstation: [id number]   DG Chest Portable 1 View Result Date: 02/06/2024 EXAM: 1 VIEW(S) XRAY OF THE CHEST 02/06/2024 10:12:45 AM COMPARISON: 06/25/2023 CLINICAL HISTORY: Shortness of breath. FINDINGS: LUNGS AND PLEURA: Mild elevation of left hemidiaphragm. No focal pulmonary opacity. No pleural effusion. No pneumothorax. HEART AND MEDIASTINUM: Aortic arch atherosclerotic calcifications. No acute abnormality of the cardiac and mediastinal silhouettes. BONES AND SOFT TISSUES: Remote bilateral rib fractures. IMPRESSION: 1. No acute cardiopulmonary abnormality. Electronically signed by: Katheleen Faes MD 02/06/2024 11:21 AM EST RP Workstation: [id number]     Assessment and Plan:  COPD exacerbation Sinus tachycardia Shortness of breath -He is jittery on exam and has sinus tachycardia after receiving nebulizer therapy and IV Solu-Medrol . -Overall he has improved greatly from presentation to the ED and agree this is likely a COPD exacerbation.  I have a low suspicion for cardiac etiology for his symptoms.  Continue current management of COPD exacerbation.  He is comfortable on room air  Coronary artery calcifications Elevated troponins -Patient is without chest pain and reports a normal stress test [facility]-1/2 years  [facility].  In addition he also notes a normal echocardiogram within the last 2 weeks.  I am unable to review these records but patient states he was  told no abnormalities of note.  LVH was noted. -The patient does not likely need any further inpatient cardiovascular workup.  Troponins are minimally elevated and likely represent demand ischemia in the setting of coronary artery calcifications.  He would be a poor candidate for coronary CTA inpatient given his sinus tachycardia, rendering him not a candidate for inpatient coronary CT at this time.  It would be reasonable to consider an outpatient coronary CT or better yet a nuclear stress test to ensure no changes have occurred since his stress test 1-1/2 years ago.  Primary service can choose to pursue echocardiogram inpatient if felt necessary, however overall picture suggests no significant cardiovascular abnormalities and the patient does report a normal echo 2 weeks ago.  Would encourage primary team to obtain those records if necessary. -Continue home statin.  Okay to continue ASA 81 mg daily and atorvastatin  10 mg daily.  HFpEF -Very minimal volume now seems to be euvolemic.  Does take Lasix  at home, we would consider just resuming this instead of any additional IV Lasix .  Hypertension -Can consider resuming patient's home antihypertensive regimen  There are no further inpatient cardiovascular recommendations.  Outpatient testing can be coordinated prior to dismissal pending patient's clinical course.   Risk Assessment/Risk Scores:              For questions or updates, please contact [facility] Please consult [email or web address] for contact info under      Signed, Soyla DELENA Merck, MD  02/06/2024 5:13 PM     [1]  Allergies Allergen Reactions   Bactrim [Sulfamethoxazole-Trimethoprim] Swelling and Other (See Comments)    Tongue swells   Penicillins Other (See Comments)    From childhood- reaction not recalled   "

## 2024-02-06 NOTE — H&P (Signed)
 "                                                                                                                                                                                                                                                                                                                                                                         Tele-Hospitalist H&P Note    Patient Demographics:   Tanner Chang, is a 69 y.o. male  MRN: [id number]   DOB - [birth year]  Referring Provider: EDP Dr Juliene Bicker Telemedicine Provider: Lavada Stank M.D   Patient Location: [facility]  Referring Diagnosis: CC SOB   Patient Name and DOB verified: Tanner Chang,  DOB - [birth year]    Patient consented to Telemedicine Evaluation:   RN virtual assistant: Okey Edsel HERO, RN   Video encounter time and date: 02/06/2024 at 12:52 PM       HPI:    Tanner Chang  is a 69 y.o. male, with history of COPD not on home oxygen , chronic diastolic CHF last EF 60% in 2022, hypertension, dyslipidemia, anxiety, past history of smoking quit few years ago.  Patient who was in his usual state of health up till about 2 weeks ago and then subsequently started feeling fatigued, generalized weakness along with exertional shortness of breath and some wheezing, he lives with his mom but is not exposed to any sick contacts, no recent travel, no fever or chills, no chest pain or productive cough.  He went to see his PCP at [facility] and got some Lasix  a few days ago without any relief, he now presents to [facility] for continued symptoms.  He currently complains of no headache, no fever or chills, no chest pain or palpitations,  he does have shortness of breath along with generalized weakness, his main complaint is shortness of breath with even slightest of exertion, no orthopnea, no weight gain, he has not noticed any significant swelling, denies any chest pain, has not noticed any wheezing, in the ER  he had a CT scan of the chest which was nonacute, initial lab work was unremarkable.  I was requested to admit the patient for exertional shortness of breath due to possible COPD exacerbation.    Review of systems:    A full 10 point Review of Systems was done, except as stated above, all other Review of Systems were negative.   With Past History of the following :    Past Medical History:  Diagnosis Date   CHF (congestive heart failure) (HCC)    COPD (chronic obstructive pulmonary disease) (HCC)    Hypertension    Long COVID       Past Surgical History:  Procedure Laterality Date   BACK SURGERY  1990      Social History:     Social History   Tobacco Use   Smoking status: Former    Current packs/day: 0.00    Average packs/day: 2.0 packs/day for 20.0 years (40.0 ttl pk-yrs)    Types: Cigarettes    Start date: 06/1998    Quit date: 06/2018    Years since quitting: 5.6    Passive exposure: Past   Smokeless tobacco: Former  Substance Use Topics   Alcohol  use: Yes       Family History :   No history of DM type II   Home Medications:   Prior to Admission medications  Medication Sig Start Date End Date Taking? Authorizing Provider  acetaminophen  (TYLENOL ) 500 MG tablet Take 1,000 mg by mouth every 6 (six) hours as needed for mild pain or headache.   Yes [provider]  albuterol  (PROVENTIL ) (2.5 MG/3ML) 0.083% nebulizer solution Take 3 mLs (2.5 mg total) by nebulization every 6 (six) hours as needed for wheezing or shortness of breath. 06/29/20  Yes Curatolo, Adam, DO  albuterol  (VENTOLIN  HFA) 108 (90 Base) MCG/ACT inhaler Inhale 2 puffs into the lungs every 4 (four) hours as needed for wheezing or shortness of breath. 12/14/19  Yes Dykstra, Charlie RAMAN, MD  Aspirin -Salicylamide-Caffeine (BC HEADACHE POWDER PO) Take 2 packets by mouth daily as needed (headaches).   Yes [provider]  atorvastatin  (LIPITOR) 10 MG tablet Take 10 mg by mouth at bedtime.    Yes [provider]  buPROPion  (WELLBUTRIN  XL) 300 MG 24 hr tablet Take 300 mg by mouth daily with breakfast.   Yes [provider]  doxycycline  (VIBRAMYCIN ) 100 MG capsule Take 1 capsule (100 mg total) by mouth 2 (two) times daily. One po bid x 7 days 06/25/23  Yes Lenor Hollering, MD  FLUoxetine  (PROZAC ) 20 MG capsule Take 20 mg by mouth in the morning.   Yes [provider]  fluticasone -salmeterol (WIXELA INHUB) 500-50 MCG/ACT AEPB Inhale 1 puff into the lungs in the morning and at bedtime. 10/01/21  Yes Hunsucker, Donnice SAUNDERS, MD  furosemide  (LASIX ) 40 MG tablet Take 1 tablet (40 mg total) by mouth daily. 03/27/20  Yes Rojelio Nest, DO  Ipratropium-Albuterol  (COMBIVENT  RESPIMAT) 20-100 MCG/ACT AERS respimat Inhale 2 puffs into the lungs daily.   Yes [provider]  losartan  (COZAAR ) 100 MG tablet Take 100 mg by mouth in the morning.   Yes [provider]  Multiple Vitamins-Minerals (ONE-A-DAY MENS 50+ ADVANTAGE) TABS  Take 1 tablet by mouth daily with breakfast.   Yes [provider]  omeprazole (PRILOSEC) 20 MG capsule Take 20 mg by mouth daily before breakfast.   Yes [provider]  OVER THE COUNTER MEDICATION Take 1 tablet by mouth daily. Vitamin d , vitamin c, zinc combo vitamin   Yes [provider]  Oxcarbazepine  (TRILEPTAL ) 300 MG tablet Take 300 mg by mouth 2 (two) times daily.   Yes [provider]  potassium chloride  SA (KLOR-CON ) 20 MEQ tablet Take 1 tablet (20 mEq total) by mouth daily. 05/02/20  Yes Freddi Hamilton, MD  predniSONE  (DELTASONE ) 20 MG tablet 2 tabs po daily x 4 days 06/25/23  Yes Lenor Hollering, MD  Psyllium (METAMUCIL PO) Take by mouth See admin instructions. Mix 1 tablespoonful of powder into 8 ounces of water and drink once a day as needed for constipation   Yes [provider]  Tiotropium Bromide  Monohydrate (SPIRIVA  RESPIMAT) 2.5 MCG/ACT AERS Inhale 2 puffs into the lungs daily.  10/01/21  Yes Hunsucker, Donnice SAUNDERS, MD     Allergies:    Allergies[1]   Physical Exam: Assisted by patient's RN over the camera   Vitals  Blood pressure 119/87, pulse (!) 105, temperature 98.3 F (36.8 C), resp. rate 19, SpO2 98%.   Virtual exam done over Video assisted by RN  1. General Middle-aged Caucasian male laying in hospital bed, in no apparent distress wearing 2 L nasal cannula oxygen   2. Normal affect and insight, Not Suicidal or Homicidal, Awake Alert,   3. No F.N deficits, Strength & Sensation equally intact all 4 extremities   4. Ears and Eyes appear Normal,   5. Supple Neck,    6. Symmetrical Chest wall movement, Good air movement bilaterally, some expiratory wheezes and bilateral bibasilar crackles  7. RRR, No Murmurs,   8. Positive Bowel Sounds, Abdomen Soft, No tenderness,    9. Normal ROM.  Trace edema but largely unchanged    Data Review:   Recent Labs  Lab 02/06/24 1011 02/06/24 1110  WBC 8.6  --   HGB 14.6 14.3  HCT 41.3 42.0  PLT 269  --   MCV 86.8  --   MCH 30.7  --   MCHC 35.4  --   RDW 12.7  --   LYMPHSABS 1.5  --   MONOABS 0.8  --   EOSABS 0.2  --   BASOSABS 0.1  --     Recent Labs  Lab 02/06/24 1011 02/06/24 1110  NA 137 136  K 3.9 3.8  CL 97*  --   CO2 25  --   ANIONGAP 16*  --   GLUCOSE 111*  --   BUN 20  --   CREATININE 1.18  --   AST 46*  --   ALT 41  --   ALKPHOS 86  --   BILITOT 1.1  --   ALBUMIN 4.7  --   CALCIUM  10.1  --     No results found for: CHOL, HDL, LDLCALC, LDLDIRECT, TRIG, CHOLHDL  Recent Labs  Lab 02/06/24 1011  CALCIUM  10.1    Recent Labs  Lab 02/06/24 1011  WBC 8.6  PLT 269  CREATININE 1.18    Urinalysis    Component Value Date/Time   COLORURINE YELLOW 05/05/2023 2118   APPEARANCEUR CLEAR 05/05/2023 2118   LABSPEC >1.046 (H) 05/05/2023 2118   PHURINE 7.0 05/05/2023 2118   GLUCOSEU NEGATIVE 05/05/2023 2118   HGBUR NEGATIVE 05/05/2023 2118   BILIRUBINUR  NEGATIVE 05/05/2023 2118   KETONESUR NEGATIVE 05/05/2023 2118   PROTEINUR 100 (A) 05/05/2023 2118   NITRITE NEGATIVE 05/05/2023 2118   LEUKOCYTESUR NEGATIVE 05/05/2023 2118      Imaging Results:    CT Angio Chest PE W and/or Wo Contrast Result Date: 02/06/2024 EXAM: CTA of the Chest with contrast for PE 02/06/2024 11:21:15 AM TECHNIQUE: CTA of the chest was performed after the administration of intravenous contrast. Multiplanar reformatted images are provided for review. MIP images are provided for review. Automated exposure control, iterative reconstruction, and/or weight based adjustment of the mA/kV was utilized to reduce the radiation dose to as low as reasonably achievable. COMPARISON: CTA chest 05/05/2023, chest x-ray 02/06/2024. CLINICAL HISTORY: 69 year old male. Pulmonary embolism (PE) suspected, high probability. Progressive shortness of breath and weakness, abnormal left lung base auscultation, recently started on Lasix . FINDINGS: PULMONARY ARTERIES: Pulmonary arteries are adequately opacified for evaluation. Mild mixing artifact in the main pulmonary arteries but good pulmonary artery contrast timing. No pulmonary embolism. Main pulmonary artery is normal in caliber. MEDIASTINUM: The heart demonstrates calcified coronary artery atherosclerosis. No pericardial effusion. The pericardium demonstrates no acute abnormality. The thoracic aorta is portuous with moderate aortic atherosclerosis. There is no acute abnormality of the thoracic aorta. LYMPH NODES: No mediastinal, hilar or axillary lymphadenopathy. LUNGS AND PLEURA: Improved lung volumes. Chronic mild elevation of the left hemidiaphragm is stable. Minor left lung base atelectasis. Subtle upper lobe predominant centrilobular emphysema suspected. No pleural effusion or pneumothorax. UPPER ABDOMEN: Limited images of the upper abdomen are unremarkable. SOFT TISSUES AND BONES: Superficial posterior dermal or subdermal sebaceous type cyst  incidentally noted. Stable chronic left lateral 6 through 8 rib fractures. Also stable chronic right posterior and lateral rib fractures. IMPRESSION: 1. No pulmonary embolism. 2. Possible emphysema. No acute or inflammatory process identified in the chest. 3. Aortic Atherosclerosis (ICD10-I70.0). Electronically signed by: Tanner Hurst MD 02/06/2024 11:55 AM EST RP Workstation: [id number]   DG Chest Portable 1 View Result Date: 02/06/2024 EXAM: 1 VIEW(S) XRAY OF THE CHEST 02/06/2024 10:12:45 AM COMPARISON: 06/25/2023 CLINICAL HISTORY: Shortness of breath. FINDINGS: LUNGS AND PLEURA: Mild elevation of left hemidiaphragm. No focal pulmonary opacity. No pleural effusion. No pneumothorax. HEART AND MEDIASTINUM: Aortic arch atherosclerotic calcifications. No acute abnormality of the cardiac and mediastinal silhouettes. BONES AND SOFT TISSUES: Remote bilateral rib fractures. IMPRESSION: 1. No acute cardiopulmonary abnormality. Electronically signed by: Katheleen Faes MD 02/06/2024 11:21 AM EST RP Workstation: [id number]    My personal review of EKG: Rhythm sinus tachycardia with 107 bpm,  no Acute ST changes   Assessment & Plan:   1.  Acute hypoxic respiratory failure in a patient with underlying history of COPD rule out underlying CHF versus ischemia.  Gradually getting worse, mostly shortness of breath with exertion, no productive cough, no fever no sick contacts, no chest pain.  Chest x-ray nonacute, CTA nonacute, EKG nonacute, troponin 2 sets stable, proBNP unremarkable.    Some wheezing on exam as described by the telehealth nurse and ED physician, he has received IV Solu-Medrol  for COPD exacerbation which will be continued, oral azithromycin  added for COPD exacerbation.  Continue nebulizer treatments, home inhalers, supplemental oxygen  as needed, I-S and flutter valve added for pulmonary toiletry.  Monitor extended viral panel results as well along with inflammatory markers.  Will also do workup for CHF,  his main symptom is exertional shortness of breath with even minimal exertion which is gradually getting worse, although proBNP is stable he could have some CHF, he has already  received IV Lasix  in the ER and took 40 mg of oral Lasix  last night, will monitor him on telemetry, check echocardiogram, I have requested [place] cardiology on-call Dr. Loni to evaluate the patient for possible ischemic workup.  For now we will place him on baby aspirin , home dose statin, and monitor on telemetry.  2.  Minimally elevated troponin.  Trend is flat and in non-ACS pattern, he is chest pain-free with stable EKG.  Workup as above.  Likely due to demand mismatch from exertional shortness of breath and resulting mild hypoxia.  3.  Hypertension.  Continue Cozaar , low-dose Imdur  and as needed IV hydralazine .  Monitor and adjust  4.  Dyslipidemia.  Home statin continue.  5.  GERD.  Continue PPI.  6.  Depression and anxiety Home medication regimen will be continued which includes Wellbutrin  XR and Prozac .  DVT Prophylaxis Lovenox   AM Labs Ordered, also please review Full Orders  Family Communication: Admission, patients condition and plan of care including tests being ordered have been discussed with the patient  who indicates understanding and agree with the plan and Code Status.  Code Status full code  Likely DC to likely home  Condition GUARDED    Consults called: [place] cardiology Dr. Loni  Admission status: Inpatient  Time spent in minutes : 40 minutes  Signature  -    Lavada Stank M.D on 02/06/2024 at 12:52 PM   -  To page go to [email or web address]             [1]  Allergies Allergen Reactions   Bactrim [Sulfamethoxazole-Trimethoprim] Swelling and Other (See Comments)    Tongue swells   Penicillins Other (See Comments)    From childhood- reaction not recalled   "

## 2024-02-06 NOTE — ED Notes (Signed)
Called [facility] for transport.

## 2024-02-06 NOTE — ED Triage Notes (Signed)
 SOB and weakness worsened this week Seen at [facility] clinic yesterday - given lasix  prescription. Crackles left base.

## 2024-02-06 NOTE — Plan of Care (Signed)
" °  Problem: Education: Goal: Knowledge of General Education information will improve Description: Including pain rating scale, medication(s)/side effects and non-pharmacologic comfort measures Outcome: Progressing   Problem: Clinical Measurements: Goal: Respiratory complications will improve Outcome: Progressing Goal: Cardiovascular complication will be avoided Outcome: Progressing   Problem: Activity: Goal: Ability to tolerate increased activity will improve Outcome: Progressing   Problem: Respiratory: Goal: Levels of oxygenation will improve Outcome: Progressing   "

## 2024-02-07 ENCOUNTER — Inpatient Hospital Stay (HOSPITAL_COMMUNITY)

## 2024-02-07 DIAGNOSIS — R0602 Shortness of breath: Secondary | ICD-10-CM

## 2024-02-07 LAB — CBC WITH DIFFERENTIAL/PLATELET
Abs Immature Granulocytes: 0.13 10*3/uL — ABNORMAL HIGH (ref 0.00–0.07)
Basophils Absolute: 0 10*3/uL (ref 0.0–0.1)
Basophils Relative: 0 %
Eosinophils Absolute: 0 10*3/uL (ref 0.0–0.5)
Eosinophils Relative: 0 %
HCT: 40.8 % (ref 39.0–52.0)
Hemoglobin: 14.2 g/dL (ref 13.0–17.0)
Immature Granulocytes: 1 %
Lymphocytes Relative: 7 %
Lymphs Abs: 0.7 10*3/uL (ref 0.7–4.0)
MCH: 30.3 pg (ref 26.0–34.0)
MCHC: 34.8 g/dL (ref 30.0–36.0)
MCV: 87.2 fL (ref 80.0–100.0)
Monocytes Absolute: 0.6 10*3/uL (ref 0.1–1.0)
Monocytes Relative: 6 %
Neutro Abs: 9.3 10*3/uL — ABNORMAL HIGH (ref 1.7–7.7)
Neutrophils Relative %: 86 %
Platelets: 274 10*3/uL (ref 150–400)
RBC: 4.68 MIL/uL (ref 4.22–5.81)
RDW: 12.8 % (ref 11.5–15.5)
WBC: 10.8 10*3/uL — ABNORMAL HIGH (ref 4.0–10.5)
nRBC: 0 % (ref 0.0–0.2)

## 2024-02-07 LAB — MAGNESIUM: Magnesium: 1.8 mg/dL (ref 1.7–2.4)

## 2024-02-07 LAB — BASIC METABOLIC PANEL WITH GFR
Anion gap: 14 (ref 5–15)
BUN: 29 mg/dL — ABNORMAL HIGH (ref 8–23)
CO2: 25 mmol/L (ref 22–32)
Calcium: 9.6 mg/dL (ref 8.9–10.3)
Chloride: 97 mmol/L — ABNORMAL LOW (ref 98–111)
Creatinine, Ser: 1.35 mg/dL — ABNORMAL HIGH (ref 0.61–1.24)
GFR, Estimated: 58 mL/min — ABNORMAL LOW
Glucose, Bld: 141 mg/dL — ABNORMAL HIGH (ref 70–99)
Potassium: 3.9 mmol/L (ref 3.5–5.1)
Sodium: 136 mmol/L (ref 135–145)

## 2024-02-07 LAB — ETHANOL: Alcohol, Ethyl (B): <15 mg/dL

## 2024-02-07 LAB — GLUCOSE, CAPILLARY: Glucose-Capillary: 157 mg/dL — ABNORMAL HIGH (ref 70–99)

## 2024-02-07 MED ORDER — LORAZEPAM 2 MG/ML IJ SOLN: 0.0000 mg | Freq: Three times a day (TID) | INTRAMUSCULAR | Status: DC

## 2024-02-07 MED ORDER — LORAZEPAM 2 MG/ML IJ SOLN
1.0000 mg | INTRAMUSCULAR | Status: DC | PRN
  Administered 2024-02-07 – 2024-02-08 (×2): 3 mg via INTRAVENOUS
  Filled 2024-02-07: qty 1
  Filled 2024-02-07 (×2): qty 2

## 2024-02-07 MED ORDER — THIAMINE MONONITRATE 100 MG PO TABS
100.0000 mg | ORAL_TABLET | Freq: Every day | ORAL | Status: DC
  Administered 2024-02-07 – 2024-02-09 (×3): 100 mg via ORAL
  Filled 2024-02-07 (×3): qty 1

## 2024-02-07 MED ORDER — LORAZEPAM 2 MG/ML IJ SOLN
1.0000 mg | INTRAMUSCULAR | Status: DC | PRN
  Administered 2024-02-07 (×2): 3 mg via INTRAVENOUS
  Filled 2024-02-07 (×3): qty 2

## 2024-02-07 MED ORDER — LORAZEPAM 1 MG PO TABS: 1.0000 mg | ORAL_TABLET | ORAL | Status: DC | PRN

## 2024-02-07 MED ORDER — LORAZEPAM 2 MG/ML IJ SOLN
0.0000 mg | INTRAMUSCULAR | Status: AC
  Administered 2024-02-07: 2 mg via INTRAVENOUS
  Administered 2024-02-07: 3 mg via INTRAVENOUS
  Administered 2024-02-07 (×2): 2 mg via INTRAVENOUS
  Administered 2024-02-08 (×2): 1 mg via INTRAVENOUS
  Administered 2024-02-08: 2 mg via INTRAVENOUS
  Administered 2024-02-08 – 2024-02-09 (×3): 1 mg via INTRAVENOUS
  Filled 2024-02-07: qty 2
  Filled 2024-02-07 (×2): qty 1
  Filled 2024-02-07: qty 2
  Filled 2024-02-07 (×6): qty 1

## 2024-02-07 MED ORDER — THIAMINE HCL 100 MG/ML IJ SOLN: 100.0000 mg | Freq: Every day | INTRAMUSCULAR | Status: DC

## 2024-02-07 MED ORDER — ADULT MULTIVITAMIN W/MINERALS CH
1.0000 | ORAL_TABLET | Freq: Every day | ORAL | Status: DC
  Administered 2024-02-07 – 2024-02-09 (×3): 1 via ORAL
  Filled 2024-02-07 (×3): qty 1

## 2024-02-07 MED ORDER — FOLIC ACID 1 MG PO TABS
1.0000 mg | ORAL_TABLET | Freq: Every day | ORAL | Status: DC
  Administered 2024-02-07 – 2024-02-09 (×3): 1 mg via ORAL
  Filled 2024-02-07 (×3): qty 1

## 2024-02-07 MED ORDER — LORAZEPAM 2 MG/ML IJ SOLN
4.0000 mg | Freq: Once | INTRAMUSCULAR | Status: AC
  Administered 2024-02-07: 4 mg via INTRAVENOUS

## 2024-02-07 NOTE — Progress Notes (Signed)
 " PROGRESS NOTE    Tanner Chang  FMW:[id number] DOB: Mar 08, 1955 DOA: 02/06/2024 PCP: [facility]    Brief Narrative:  Tanner Chang  is a 69 y.o. male, with history of COPD not on home oxygen , chronic diastolic CHF last EF 60% in 2022, hypertension, dyslipidemia, anxiety, past history of smoking quit few years ago.   Patient who was in his usual state of health up till about 2 weeks ago and then subsequently started feeling fatigued, generalized weakness along with exertional shortness of breath and some wheezing, he lives with his mom but is not exposed to any sick contacts, no recent travel, no fever or chills, no chest pain or productive cough.  He went to see his PCP at [facility] and got some Lasix  a few days ago without any relief, he now presents to [facility] for continued symptoms.      Assessment and Plan: Alcohol  withdrawal - Significant CIWA scores requiring Ativan  - Will change to scheduled and as needed Ativan  for now - Will need to monitor closely and consider transfer to the ICU   ?AKI - recheck labs in the a.m. -Given IV Lasix  yesterday   Acute hypoxic respiratory failure in a patient with underlying history of COPD rule out  -  Chest x-ray nonacute, CTA nonacute, EKG nonacute, troponin 2 sets stable, proBNP unremarkable.   -IV Solu-Medrol  - Wean oxygen  as able   Minimally elevated troponin.  Trend is flat and in non-ACS pattern, he is chest pain-free with stable EKG.  Workup as above.  Likely due to demand mismatch from exertional shortness of breath and resulting mild hypoxia. -Seen by cardiology: Echo to be done outpatient, follow-up outpatient-due to alcohol  withdrawal does not need any further workup inpatient    Hypertension. -Blood pressure lower end of normal so we will hold blood pressure medicines    Dyslipidemia.  Home statin continue.   GERD.  Continue PPI.    Depression and anxiety Home medication regimen will be continued  which includes Wellbutrin  XR and Prozac .    DVT prophylaxis: enoxaparin  (LOVENOX ) injection 40 mg Start: 02/06/24 1630    Code Status: Full Code Family Communication:   Disposition Plan:  Level of care: Progressive Status is: Inpatient     Consultants:  Cardiology  Subjective: Patient confused and in acute alcohol  withdrawal  Objective: Vitals:   02/07/24 0848 02/07/24 0852 02/07/24 1020 02/07/24 1130  BP: 120/86  102/74 104/78  Pulse: (!) 101  (!) 105 (!) 102  Resp: 19  19 19   Temp:    98 F (36.7 C)  TempSrc:    Axillary  SpO2: 98% 94% 90% 90%    Intake/Output Summary (Last 24 hours) at 02/07/2024 1141 Last data filed at 02/07/2024 0300 Gross per 24 hour  Intake 92.88 ml  Output 200 ml  Net -107.12 ml   There were no vitals filed for this visit.  Examination:   General: Appearance:    Obese male in no acute distress     Lungs:   Diminished, bilaterally   Heart:    Tachycardic.   MS:   All extremities are intact.    Neurologic:   Awake,-confused to person and time as well as situation       Data Reviewed: I have personally reviewed following labs and imaging studies  CBC: Recent Labs  Lab 02/06/24 1011 02/06/24 1110 02/06/24 1820 02/07/24 0238  WBC 8.6  --  6.0 10.8*  NEUTROABS 6.0  --   --  9.3*  HGB 14.6 14.3 14.6 14.2  HCT 41.3 42.0 42.0 40.8  MCV 86.8  --  87.5 87.2  PLT 269  --  265 274   Basic Metabolic Panel: Recent Labs  Lab 02/06/24 1011 02/06/24 1110 02/06/24 1820 02/07/24 0238  NA 137 136  --  136  K 3.9 3.8  --  3.9  CL 97*  --   --  97*  CO2 25  --   --  25  GLUCOSE 111*  --   --  141*  BUN 20  --   --  29*  CREATININE 1.18  --  1.39* 1.35*  CALCIUM  10.1  --   --  9.6  MG  --   --   --  1.8   GFR: CrCl cannot be calculated (Unknown ideal weight.). Liver Function Tests: Recent Labs  Lab 02/06/24 1011  AST 46*  ALT 41  ALKPHOS 86  BILITOT 1.1  PROT 7.4  ALBUMIN 4.7   No results for input(s): LIPASE,  AMYLASE in the last 168 hours. No results for input(s): AMMONIA in the last 168 hours. Coagulation Profile: No results for input(s): INR, PROTIME in the last 168 hours. Cardiac Enzymes: No results for input(s): CKTOTAL, CKMB, CKMBINDEX, TROPONINI in the last 168 hours. BNP (last 3 results) Recent Labs    05/05/23 1852 06/25/23 1623 02/06/24 1011  PROBNP 85.7 39.9 78.2   HbA1C: No results for input(s): HGBA1C in the last 72 hours. CBG: Recent Labs  Lab 02/07/24 0420  GLUCAP 157*   Lipid Profile: No results for input(s): CHOL, HDL, LDLCALC, TRIG, CHOLHDL, LDLDIRECT in the last 72 hours. Thyroid Function Tests: No results for input(s): TSH, T4TOTAL, FREET4, T3FREE, THYROIDAB in the last 72 hours. Anemia Panel: No results for input(s): VITAMINB12, FOLATE, FERRITIN, TIBC, IRON, RETICCTPCT in the last 72 hours. Sepsis Labs: Recent Labs  Lab 02/06/24 1425  PROCALCITON 0.13    Recent Results (from the past 240 hours)  Resp panel by RT-PCR (RSV, Flu A&B, Covid) Anterior Nasal Swab     Status: None   Collection Time: 02/06/24 10:11 AM   Specimen: Anterior Nasal Swab  Result Value Ref Range Status   SARS Coronavirus 2 by RT PCR NEGATIVE NEGATIVE Final    Comment: (NOTE) SARS-CoV-2 target nucleic acids are NOT DETECTED.  The SARS-CoV-2 RNA is generally detectable in upper respiratory specimens during the acute phase of infection. The lowest concentration of SARS-CoV-2 viral copies this assay can detect is 138 copies/mL. A negative result does not preclude SARS-Cov-2 infection and should not be used as the sole basis for treatment or other patient management decisions. A negative result may occur with  improper specimen collection/handling, submission of specimen other than nasopharyngeal swab, presence of viral mutation(s) within the areas targeted by this assay, and inadequate number of viral copies(<138 copies/mL). A  negative result must be combined with clinical observations, patient history, and epidemiological information. The expected result is Negative.  Fact Sheet for Patients:  bloggercourse.com  Fact Sheet for Healthcare Providers:  seriousbroker.it  This test is no t yet approved or cleared by the [place]  FDA and  has been authorized for detection and/or diagnosis of SARS-CoV-2 by FDA under an Emergency Use Authorization (EUA). This EUA will remain  in effect (meaning this test can be used) for the duration of the COVID-19 declaration under Section 564(b)(1) of the Act, 21 U.S.C.section 360bbb-3(b)(1), unless the authorization is terminated  or revoked sooner.  Influenza A by PCR NEGATIVE NEGATIVE Final   Influenza B by PCR NEGATIVE NEGATIVE Final    Comment: (NOTE) The Xpert Xpress SARS-CoV-2/FLU/RSV plus assay is intended as an aid in the diagnosis of influenza from Nasopharyngeal swab specimens and should not be used as a sole basis for treatment. Nasal washings and aspirates are unacceptable for Xpert Xpress SARS-CoV-2/FLU/RSV testing.  Fact Sheet for Patients: bloggercourse.com  Fact Sheet for Healthcare Providers: seriousbroker.it  This test is not yet approved or cleared by the [place]  FDA and has been authorized for detection and/or diagnosis of SARS-CoV-2 by FDA under an Emergency Use Authorization (EUA). This EUA will remain in effect (meaning this test can be used) for the duration of the COVID-19 declaration under Section 564(b)(1) of the Act, 21 U.S.C. section 360bbb-3(b)(1), unless the authorization is terminated or revoked.     Resp Syncytial Virus by PCR NEGATIVE NEGATIVE Final    Comment: (NOTE) Fact Sheet for Patients: bloggercourse.com  Fact Sheet for Healthcare  Providers: seriousbroker.it  This test is not yet approved or cleared by the [place]  FDA and has been authorized for detection and/or diagnosis of SARS-CoV-2 by FDA under an Emergency Use Authorization (EUA). This EUA will remain in effect (meaning this test can be used) for the duration of the COVID-19 declaration under Section 564(b)(1) of the Act, 21 U.S.C. section 360bbb-3(b)(1), unless the authorization is terminated or revoked.  Performed at [facility], [address] [place], [place] [address]   Respiratory (~20 pathogens) panel by PCR     Status: None   Collection Time: 02/06/24  3:33 PM   Specimen: Nasopharyngeal Swab; Respiratory  Result Value Ref Range Status   Adenovirus NOT DETECTED NOT DETECTED Final   Coronavirus 229E NOT DETECTED NOT DETECTED Final    Comment: (NOTE) The Coronavirus on the Respiratory Panel, DOES NOT test for the novel  Coronavirus (2019 nCoV)    Coronavirus HKU1 NOT DETECTED NOT DETECTED Final   Coronavirus NL63 NOT DETECTED NOT DETECTED Final   Coronavirus OC43 NOT DETECTED NOT DETECTED Final   Metapneumovirus NOT DETECTED NOT DETECTED Final   Rhinovirus / Enterovirus NOT DETECTED NOT DETECTED Final   Influenza A NOT DETECTED NOT DETECTED Final   Influenza B NOT DETECTED NOT DETECTED Final   Parainfluenza Virus 1 NOT DETECTED NOT DETECTED Final   Parainfluenza Virus 2 NOT DETECTED NOT DETECTED Final   Parainfluenza Virus 3 NOT DETECTED NOT DETECTED Final   Parainfluenza Virus 4 NOT DETECTED NOT DETECTED Final   Respiratory Syncytial Virus NOT DETECTED NOT DETECTED Final   Bordetella pertussis NOT DETECTED NOT DETECTED Final   Bordetella Parapertussis NOT DETECTED NOT DETECTED Final   Chlamydophila pneumoniae NOT DETECTED NOT DETECTED Final   Mycoplasma pneumoniae NOT DETECTED NOT DETECTED Final    Comment: Performed at [facility], [address], [place], [place] [address]          Radiology Studies: CT Angio Chest PE W and/or Wo Contrast Result Date: 02/06/2024 EXAM: CTA of the Chest with contrast for PE 02/06/2024 11:21:15 AM TECHNIQUE: CTA of the chest was performed after the administration of intravenous contrast. Multiplanar reformatted images are provided for review. MIP images are provided for review. Automated exposure control, iterative reconstruction, and/or weight based adjustment of the mA/kV was utilized to reduce the radiation dose to as low as reasonably achievable. COMPARISON: CTA chest 05/05/2023, chest x-ray 02/06/2024. CLINICAL HISTORY: 69 year old male. Pulmonary embolism (PE) suspected, high probability. Progressive shortness of breath  and weakness, abnormal left lung base auscultation, recently started on Lasix . FINDINGS: PULMONARY ARTERIES: Pulmonary arteries are adequately opacified for evaluation. Mild mixing artifact in the main pulmonary arteries but good pulmonary artery contrast timing. No pulmonary embolism. Main pulmonary artery is normal in caliber. MEDIASTINUM: The heart demonstrates calcified coronary artery atherosclerosis. No pericardial effusion. The pericardium demonstrates no acute abnormality. The thoracic aorta is portuous with moderate aortic atherosclerosis. There is no acute abnormality of the thoracic aorta. LYMPH NODES: No mediastinal, hilar or axillary lymphadenopathy. LUNGS AND PLEURA: Improved lung volumes. Chronic mild elevation of the left hemidiaphragm is stable. Minor left lung base atelectasis. Subtle upper lobe predominant centrilobular emphysema suspected. No pleural effusion or pneumothorax. UPPER ABDOMEN: Limited images of the upper abdomen are unremarkable. SOFT TISSUES AND BONES: Superficial posterior dermal or subdermal sebaceous type cyst incidentally noted. Stable chronic left lateral 6 through 8 rib fractures. Also stable chronic right posterior and lateral rib fractures. IMPRESSION: 1. No pulmonary embolism. 2.  Possible emphysema. No acute or inflammatory process identified in the chest. 3. Aortic Atherosclerosis (ICD10-I70.0). Electronically signed by: Helayne Hurst MD 02/06/2024 11:55 AM EST RP Workstation: [id number]   DG Chest Portable 1 View Result Date: 02/06/2024 EXAM: 1 VIEW(S) XRAY OF THE CHEST 02/06/2024 10:12:45 AM COMPARISON: 06/25/2023 CLINICAL HISTORY: Shortness of breath. FINDINGS: LUNGS AND PLEURA: Mild elevation of left hemidiaphragm. No focal pulmonary opacity. No pleural effusion. No pneumothorax. HEART AND MEDIASTINUM: Aortic arch atherosclerotic calcifications. No acute abnormality of the cardiac and mediastinal silhouettes. BONES AND SOFT TISSUES: Remote bilateral rib fractures. IMPRESSION: 1. No acute cardiopulmonary abnormality. Electronically signed by: Dayne Hassell MD 02/06/2024 11:21 AM EST RP Workstation: [id number]        Scheduled Meds:  aspirin  EC  81 mg Oral Daily   atorvastatin   10 mg Oral QHS   azithromycin   500 mg Oral Daily   buPROPion   300 mg Oral Q breakfast   enoxaparin  (LOVENOX ) injection  40 mg Subcutaneous Q24H   FLUoxetine   20 mg Oral q AM   folic acid   1 mg Oral Daily   ipratropium-albuterol   3 mL Nebulization Q6H   isosorbide  mononitrate  30 mg Oral Daily   LORazepam   0-4 mg Intravenous Q4H   Followed by   NOREEN ON 02/09/2024] LORazepam   0-4 mg Intravenous Q8H   losartan   25 mg Oral q AM   methylPREDNISolone  (SOLU-MEDROL ) injection  80 mg Intravenous Q24H   multivitamin with minerals  1 tablet Oral Daily   pantoprazole   40 mg Oral Daily   thiamine   100 mg Oral Daily   Or   thiamine   100 mg Intravenous Daily   umeclidinium bromide   1 puff Inhalation Daily   Continuous Infusions:   LOS: 1 day    Time spent: 45 minutes spent on chart review, discussion with nursing staff, consultants, updating family and interview/physical exam; more than 50% of that time was spent in counseling and/or coordination of care.    Harlene RAYMOND Bowl, DO [facility] Available via Epic secure chat 7am-7pm After these hours, please refer to coverage provider listed on [email or web address] 02/07/2024, 11:41 AM   "

## 2024-02-07 NOTE — Progress Notes (Signed)
 TRH night cross cover note:   I was notified by the patient's RN that this patient, who reports daily consumption of beer and admitted virtually earlier today for suspected acute copd exacerbation, is c/o some anxiety and is exhibiting some tremors, along with mild sinus tachycardia. Concern for alcohol  withdrawal given the above history, current symptoms/VS, and timing of admission.   I have started him on symptoms based CIWA protocol with as needed Ativan , place consult with TOC, and have ordered daily supplementation with thiamine , folic acid  and multivitamin, with first dose of thiamine  to occur now.  I also added on serum ethanol level and have ordered seizure precautions.   Eva Pore, DO Hospitalist

## 2024-02-07 NOTE — Significant Event (Addendum)
 Rapid Response Event Note   Reason for Call :  Agitation  Initial Focused Assessment:  Pt lying in bed with eyes open. He's AO to self, place, time. He is agitated and having visible tremors and hallucinations. CIWA-30. Lungs clear. Skin warm/diaphoretic.    CIWA protocol started tonight for alcohol  withdrawal. He has been given 6mg  ativan  since 0027.  T-98.5, HR-110, BP-134/89, RR-22, SpO2-93% on RA  Interventions:  CBG-157 Additional 4mg  Ativan  IV x 1 now Continue CIWA protocol Plan of Care:  Pt less agitated after interventions. Continue CIWA protocol and give ativan  accordingly. Seizure precautions. Continue to monitor pt closely. Please call RRT if further assistance needed.   Event Summary:   MD Notified: Dr. Marcene Call Time:0406 Arrival [phone] End Upfz:[id number]  Tish Graeme Piety, RN

## 2024-02-07 NOTE — Progress Notes (Signed)
"  °  Progress Note  Patient Name: Tanner Chang Date of Encounter: 02/07/2024 [facility] Cardiologist: None [facility]  Interval Summary   Appears to be actively withdrawing from alcohol .   Vital Signs Vitals:   02/07/24 0735 02/07/24 0846 02/07/24 0848 02/07/24 0852  BP: 122/86  120/86   Pulse: 79  (!) 101   Resp: 18  19   Temp:      TempSrc:      SpO2: 94% 94% 98% 94%    Intake/Output Summary (Last 24 hours) at 02/07/2024 9077 Last data filed at 02/07/2024 0300 Gross per 24 hour  Intake 92.88 ml  Output 200 ml  Net -107.12 ml      07/10/2021    2:30 PM 03/26/2021    4:23 PM 10/29/2020    3:24 PM  Last 3 Weights  Weight (lbs) 194 lb 12.8 oz 193 lb 184 lb  Weight (kg) 88.361 kg 87.544 kg 83.462 kg      Telemetry/ECG  ST with PVCs - Personally Reviewed  Physical Exam  GEN: acutely anxious Neck: No JVD Cardiac: tachycardic regular Respiratory: Clear to auscultation bilaterally. GI: Soft, nontender, non-distended  MS: No edema  Assessment & Plan   COPD exacerbation Sinus tachycardia Shortness of breath -treatment for copd exacerbation per primary.  Probable alcohol  withdrawal - actively withdrawing. Primary and RN addressing.    Coronary artery calcifications Elevated troponins -Patient is without chest pain and reports a normal stress test 1-1/2 years [facility].  In addition he also notes a normal echocardiogram within the last 2 weeks.  I am unable to review these records but patient states he was told no abnormalities of note.  LVH was noted. -The patient does not likely need any further inpatient cardiovascular workup.  Troponins are minimally elevated and likely represent demand ischemia in the setting of coronary artery calcifications.  He would be a poor candidate for coronary CTA inpatient given his sinus tachycardia, rendering him not a candidate for inpatient coronary CT at this time.  It would be reasonable to  consider an outpatient coronary CT or better yet a nuclear stress test to ensure no changes have occurred since his stress test 1-1/2 years ago.  Primary service can choose to pursue echocardiogram inpatient if felt necessary, however overall picture suggests no significant cardiovascular abnormalities and the patient does report a normal echo 2 weeks ago.  Would encourage primary team to obtain those records if necessary. -Continue home statin.  Okay to continue ASA 81 mg daily and atorvastatin  10 mg daily. - given active alcohol  withdrawal, will cancel echo and defer to when he is better compensated.    HFpEF -Very minimal volume now seems to be euvolemic.  Does take Lasix  at home, we would consider just resuming this instead of any additional IV Lasix .   Hypertension -Can consider resuming patient's home antihypertensive regimen   There are no further inpatient cardiovascular recommendations.  Outpatient testing can be coordinated prior to dismissal pending patient's clinical course. Cardiology will sign off. D/w primary team.       For questions or updates, please contact [facility] Please consult [email or web address] for contact info under         Signed, Soyla DELENA Merck, MD   "

## 2024-02-08 DIAGNOSIS — R0602 Shortness of breath: Secondary | ICD-10-CM | POA: Diagnosis not present

## 2024-02-08 LAB — CBC
HCT: 39.2 % (ref 39.0–52.0)
Hemoglobin: 13.6 g/dL (ref 13.0–17.0)
MCH: 30.4 pg (ref 26.0–34.0)
MCHC: 34.7 g/dL (ref 30.0–36.0)
MCV: 87.5 fL (ref 80.0–100.0)
Platelets: 266 10*3/uL (ref 150–400)
RBC: 4.48 MIL/uL (ref 4.22–5.81)
RDW: 12.8 % (ref 11.5–15.5)
WBC: 13.4 10*3/uL — ABNORMAL HIGH (ref 4.0–10.5)
nRBC: 0 % (ref 0.0–0.2)

## 2024-02-08 LAB — BASIC METABOLIC PANEL WITH GFR
Anion gap: 14 (ref 5–15)
BUN: 38 mg/dL — ABNORMAL HIGH (ref 8–23)
CO2: 26 mmol/L (ref 22–32)
Calcium: 9.5 mg/dL (ref 8.9–10.3)
Chloride: 99 mmol/L (ref 98–111)
Creatinine, Ser: 1.24 mg/dL (ref 0.61–1.24)
GFR, Estimated: >60 mL/min
Glucose, Bld: 148 mg/dL — ABNORMAL HIGH (ref 70–99)
Potassium: 4 mmol/L (ref 3.5–5.1)
Sodium: 138 mmol/L (ref 135–145)

## 2024-02-08 MED ORDER — IPRATROPIUM-ALBUTEROL 0.5-2.5 (3) MG/3ML IN SOLN: 3.0000 mL | Freq: Two times a day (BID) | RESPIRATORY_TRACT | Status: DC

## 2024-02-08 MED ORDER — ISOSORBIDE MONONITRATE ER 30 MG PO TB24
15.0000 mg | ORAL_TABLET | Freq: Every day | ORAL | Status: DC
  Administered 2024-02-08 – 2024-02-09 (×2): 15 mg via ORAL
  Filled 2024-02-08 (×2): qty 1

## 2024-02-08 NOTE — Discharge Instructions (Signed)

## 2024-02-08 NOTE — TOC CAGE-AID Note (Signed)
 Transition of Care [facility]) - CAGE-AID Screening   Patient Details  Name: Tanner Chang MRN: [id number] Date of Birth: [birth year]  [facility] of Care [facility]) CM/SW Contact:    Luise JAYSON Pan, LCSWA Phone Number: 02/08/2024, 8:31 AM   Clinical Narrative: CSW unable to assess patient at this time. CSW to place substance use resources on patients AVS.   CAGE-AID Screening: Substance Abuse Screening unable to be completed due to: : Patient unable to participate             Substance Abuse Education Offered: Yes  Substance abuse interventions: Other (must comment) (Resources added to AVS)

## 2024-02-08 NOTE — TOC CM/SW Note (Signed)
 Transition of Care [facility]) - Inpatient Brief Assessment   Patient Details  Name: Tanner Chang MRN: [id number] Date of Birth: Nov 22, 1955  Transition of Care [facility]) CM/SW Contact:    Luise JAYSON Pan, LCSWA Phone Number: 02/08/2024, 9:05 AM   Clinical Narrative: Per chart review, patient is from home with his mother. Patient has insurance and PCP on file. Patient has no prior hx of HH or SNF. Patient has previous hx of home O2 with [employer] - dating back to 2022. Patient has previously has family pick up at discharge.  Inpatient Care Management will continue to monitor.   Transition of Care Asessment: Insurance and Status: Insurance coverage has been reviewed Patient has primary care physician: Yes Home environment has been reviewed: From home w/ mom Prior level of function:: Independent Prior/Current Home Services: No current home services Social Drivers of Health Review: SDOH reviewed no interventions necessary Readmission risk has been reviewed: Yes Transition of care needs: transition of care needs identified, TOC will continue to follow

## 2024-02-08 NOTE — Progress Notes (Signed)
 " PROGRESS NOTE    Tanner Chang  FMW:[id number] DOB: 04-24-1955 DOA: 02/06/2024 PCP: [facility]    Brief Narrative:  Tanner Chang  is a 69 y.o. male, with history of COPD not on home oxygen , chronic diastolic CHF last EF 60% in 2022, hypertension, dyslipidemia, anxiety, past history of smoking quit few years ago.   Patient who was in his usual state of health up till about 2 weeks ago and then subsequently started feeling fatigued, generalized weakness along with exertional shortness of breath and some wheezing, he lives with his mom but is not exposed to any sick contacts, no recent travel, no fever or chills, no chest pain or productive cough.  He went to see his PCP at [facility] and got some Lasix  a few days ago without any relief, he now presents to [facility] for continued symptoms.      Assessment and Plan: Alcohol  withdrawal - Significant CIWA scores requiring Ativan  - Will change to scheduled and as needed Ativan  for now   ?AKI - improved- suspect from over-diuresis   Acute hypoxic respiratory failure in a patient with underlying history of COPD rule out  - Chest x-ray nonacute, CTA nonacute, EKG nonacute, troponin 2 sets stable, proBNP unremarkable.   -IV Solu-Medrol  - Wean oxygen  as able   Minimally elevated troponin.  Trend is flat and in non-ACS pattern, he is chest pain-free with stable EKG.  Workup as above.  Likely due to demand mismatch from exertional shortness of breath and resulting mild hypoxia. -Seen by cardiology: Echo to be done outpatient, follow-up outpatient-due to alcohol  withdrawal does not need any further workup inpatient    Hypertension. -Blood pressure lower end of normal so we will hold blood pressure medicines    Dyslipidemia.  Home statin continue.   GERD.  Continue PPI.   Depression and anxiety Home medication regimen will be continued which includes Wellbutrin  XR and Prozac .    DVT prophylaxis: enoxaparin  (LOVENOX )  injection 40 mg Start: 02/06/24 1630    Code Status: Full Code   Disposition Plan:  Level of care: Progressive Status is: Inpatient     Consultants:  Cardiology   Subjective: Appears less confused this a.m. but still not at baseline  Objective: Vitals:   02/08/24 0945 02/08/24 0946 02/08/24 0950 02/08/24 1000  BP: (!) 113/90   117/88  Pulse: (!) 106     Resp:    17  Temp:      TempSrc:      SpO2:  97% 97%     Intake/Output Summary (Last 24 hours) at 02/08/2024 1205 Last data filed at 02/08/2024 0900 Gross per 24 hour  Intake 220 ml  Output 200 ml  Net 20 ml   There were no vitals filed for this visit.  Examination:   General: Appearance:    Obese male in no acute distress     Lungs:   Diminished, bilaterally   Heart:    Tachycardic.   MS:   All extremities are intact.    Neurologic: Awake-oriented to time but not place       Data Reviewed: I have personally reviewed following labs and imaging studies  CBC: Recent Labs  Lab 02/06/24 1011 02/06/24 1110 02/06/24 1820 02/07/24 0238 02/08/24 0026  WBC 8.6  --  6.0 10.8* 13.4*  NEUTROABS 6.0  --   --  9.3*  --   HGB 14.6 14.3 14.6 14.2 13.6  HCT 41.3 42.0 42.0 40.8 39.2  MCV  86.8  --  87.5 87.2 87.5  PLT 269  --  265 274 266   Basic Metabolic Panel: Recent Labs  Lab 02/06/24 1011 02/06/24 1110 02/06/24 1820 02/07/24 0238 02/08/24 0026  NA 137 136  --  136 138  K 3.9 3.8  --  3.9 4.0  CL 97*  --   --  97* 99  CO2 25  --   --  25 26  GLUCOSE 111*  --   --  141* 148*  BUN 20  --   --  29* 38*  CREATININE 1.18  --  1.39* 1.35* 1.24  CALCIUM  10.1  --   --  9.6 9.5  MG  --   --   --  1.8  --    GFR: CrCl cannot be calculated (Unknown ideal weight.). Liver Function Tests: Recent Labs  Lab 02/06/24 1011  AST 46*  ALT 41  ALKPHOS 86  BILITOT 1.1  PROT 7.4  ALBUMIN 4.7   No results for input(s): LIPASE, AMYLASE in the last 168 hours. No results for input(s): AMMONIA in the last  168 hours. Coagulation Profile: No results for input(s): INR, PROTIME in the last 168 hours. Cardiac Enzymes: No results for input(s): CKTOTAL, CKMB, CKMBINDEX, TROPONINI in the last 168 hours. BNP (last 3 results) Recent Labs    05/05/23 1852 06/25/23 1623 02/06/24 1011  PROBNP 85.7 39.9 78.2   HbA1C: No results for input(s): HGBA1C in the last 72 hours. CBG: Recent Labs  Lab 02/07/24 0420  GLUCAP 157*   Lipid Profile: No results for input(s): CHOL, HDL, LDLCALC, TRIG, CHOLHDL, LDLDIRECT in the last 72 hours. Thyroid Function Tests: No results for input(s): TSH, T4TOTAL, FREET4, T3FREE, THYROIDAB in the last 72 hours. Anemia Panel: No results for input(s): VITAMINB12, FOLATE, FERRITIN, TIBC, IRON, RETICCTPCT in the last 72 hours. Sepsis Labs: Recent Labs  Lab 02/06/24 1425  PROCALCITON 0.13    Recent Results (from the past 240 hours)  Resp panel by RT-PCR (RSV, Flu A&B, Covid) Anterior Nasal Swab     Status: None   Collection Time: 02/06/24 10:11 AM   Specimen: Anterior Nasal Swab  Result Value Ref Range Status   SARS Coronavirus 2 by RT PCR NEGATIVE NEGATIVE Final    Comment: (NOTE) SARS-CoV-2 target nucleic acids are NOT DETECTED.  The SARS-CoV-2 RNA is generally detectable in upper respiratory specimens during the acute phase of infection. The lowest concentration of SARS-CoV-2 viral copies this assay can detect is 138 copies/mL. A negative result does not preclude SARS-Cov-2 infection and should not be used as the sole basis for treatment or other patient management decisions. A negative result may occur with  improper specimen collection/handling, submission of specimen other than nasopharyngeal swab, presence of viral mutation(s) within the areas targeted by this assay, and inadequate number of viral copies(<138 copies/mL). A negative result must be combined with clinical observations, patient history, and  epidemiological information. The expected result is Negative.  Fact Sheet for Patients:  bloggercourse.com  Fact Sheet for Healthcare Providers:  seriousbroker.it  This test is no t yet approved or cleared by the [place]  FDA and  has been authorized for detection and/or diagnosis of SARS-CoV-2 by FDA under an Emergency Use Authorization (EUA). This EUA will remain  in effect (meaning this test can be used) for the duration of the COVID-19 declaration under Section 564(b)(1) of the Act, 21 U.S.C.section 360bbb-3(b)(1), unless the authorization is terminated  or revoked sooner.  Influenza A by PCR NEGATIVE NEGATIVE Final   Influenza B by PCR NEGATIVE NEGATIVE Final    Comment: (NOTE) The Xpert Xpress SARS-CoV-2/FLU/RSV plus assay is intended as an aid in the diagnosis of influenza from Nasopharyngeal swab specimens and should not be used as a sole basis for treatment. Nasal washings and aspirates are unacceptable for Xpert Xpress SARS-CoV-2/FLU/RSV testing.  Fact Sheet for Patients: bloggercourse.com  Fact Sheet for Healthcare Providers: seriousbroker.it  This test is not yet approved or cleared by the [place]  FDA and has been authorized for detection and/or diagnosis of SARS-CoV-2 by FDA under an Emergency Use Authorization (EUA). This EUA will remain in effect (meaning this test can be used) for the duration of the COVID-19 declaration under Section 564(b)(1) of the Act, 21 U.S.C. section 360bbb-3(b)(1), unless the authorization is terminated or revoked.     Resp Syncytial Virus by PCR NEGATIVE NEGATIVE Final    Comment: (NOTE) Fact Sheet for Patients: bloggercourse.com  Fact Sheet for Healthcare Providers: seriousbroker.it  This test is not yet approved or cleared by the [place]  FDA and has been  authorized for detection and/or diagnosis of SARS-CoV-2 by FDA under an Emergency Use Authorization (EUA). This EUA will remain in effect (meaning this test can be used) for the duration of the COVID-19 declaration under Section 564(b)(1) of the Act, 21 U.S.C. section 360bbb-3(b)(1), unless the authorization is terminated or revoked.  Performed at [facility], [address] [place], [place] [address]   Respiratory (~20 pathogens) panel by PCR     Status: None   Collection Time: 02/06/24  3:33 PM   Specimen: Nasopharyngeal Swab; Respiratory  Result Value Ref Range Status   Adenovirus NOT DETECTED NOT DETECTED Final   Coronavirus 229E NOT DETECTED NOT DETECTED Final    Comment: (NOTE) The Coronavirus on the Respiratory Panel, DOES NOT test for the novel  Coronavirus (2019 nCoV)    Coronavirus HKU1 NOT DETECTED NOT DETECTED Final   Coronavirus NL63 NOT DETECTED NOT DETECTED Final   Coronavirus OC43 NOT DETECTED NOT DETECTED Final   Metapneumovirus NOT DETECTED NOT DETECTED Final   Rhinovirus / Enterovirus NOT DETECTED NOT DETECTED Final   Influenza A NOT DETECTED NOT DETECTED Final   Influenza B NOT DETECTED NOT DETECTED Final   Parainfluenza Virus 1 NOT DETECTED NOT DETECTED Final   Parainfluenza Virus 2 NOT DETECTED NOT DETECTED Final   Parainfluenza Virus 3 NOT DETECTED NOT DETECTED Final   Parainfluenza Virus 4 NOT DETECTED NOT DETECTED Final   Respiratory Syncytial Virus NOT DETECTED NOT DETECTED Final   Bordetella pertussis NOT DETECTED NOT DETECTED Final   Bordetella Parapertussis NOT DETECTED NOT DETECTED Final   Chlamydophila pneumoniae NOT DETECTED NOT DETECTED Final   Mycoplasma pneumoniae NOT DETECTED NOT DETECTED Final    Comment: Performed at [facility], [address] [place], [place] [address]         Radiology Studies: No results found.       Scheduled Meds:  aspirin  EC  81 mg Oral Daily   atorvastatin   10 mg Oral QHS    azithromycin   500 mg Oral Daily   buPROPion   300 mg Oral Q breakfast   enoxaparin  (LOVENOX ) injection  40 mg Subcutaneous Q24H   FLUoxetine   20 mg Oral q AM   folic acid   1 mg Oral Daily   ipratropium-albuterol   3 mL Nebulization Q6H   isosorbide  mononitrate  15 mg Oral Daily   LORazepam   0-4 mg  Intravenous Q4H   Followed by   NOREEN ON 02/09/2024] LORazepam   0-4 mg Intravenous Q8H   methylPREDNISolone  (SOLU-MEDROL ) injection  80 mg Intravenous Q24H   multivitamin with minerals  1 tablet Oral Daily   pantoprazole   40 mg Oral Daily   thiamine   100 mg Oral Daily   Or   thiamine   100 mg Intravenous Daily   umeclidinium bromide   1 puff Inhalation Daily   Continuous Infusions:   LOS: 2 days    Time spent: 45 minutes spent on chart review, discussion with nursing staff, consultants, updating family and interview/physical exam; more than 50% of that time was spent in counseling and/or coordination of care.    Harlene RAYMOND Bowl, DO [facility] Available via Epic secure chat 7am-7pm After these hours, please refer to coverage provider listed on [email or web address] 02/08/2024, 12:05 PM   "

## 2024-02-09 DIAGNOSIS — R0602 Shortness of breath: Secondary | ICD-10-CM | POA: Diagnosis not present

## 2024-02-09 LAB — BASIC METABOLIC PANEL WITH GFR
Anion gap: 12 (ref 5–15)
BUN: 41 mg/dL — ABNORMAL HIGH (ref 8–23)
CO2: 28 mmol/L (ref 22–32)
Calcium: 9.2 mg/dL (ref 8.9–10.3)
Chloride: 101 mmol/L (ref 98–111)
Creatinine, Ser: 1.08 mg/dL (ref 0.61–1.24)
GFR, Estimated: >60 mL/min
Glucose, Bld: 120 mg/dL — ABNORMAL HIGH (ref 70–99)
Potassium: 4.1 mmol/L (ref 3.5–5.1)
Sodium: 141 mmol/L (ref 135–145)

## 2024-02-09 LAB — CBC
HCT: 39.1 % (ref 39.0–52.0)
Hemoglobin: 13.1 g/dL (ref 13.0–17.0)
MCH: 30.3 pg (ref 26.0–34.0)
MCHC: 33.5 g/dL (ref 30.0–36.0)
MCV: 90.5 fL (ref 80.0–100.0)
Platelets: 225 10*3/uL (ref 150–400)
RBC: 4.32 MIL/uL (ref 4.22–5.81)
RDW: 12.6 % (ref 11.5–15.5)
WBC: 12.2 10*3/uL — ABNORMAL HIGH (ref 4.0–10.5)
nRBC: 0 % (ref 0.0–0.2)

## 2024-02-09 LAB — GLUCOSE, CAPILLARY: Glucose-Capillary: 126 mg/dL — ABNORMAL HIGH (ref 70–99)

## 2024-02-09 MED ORDER — ASPIRIN 81 MG PO TBEC: 81.0000 mg | DELAYED_RELEASE_TABLET | Freq: Every day | ORAL | Status: AC

## 2024-02-09 MED ORDER — PREDNISONE 50 MG PO TABS: ORAL_TABLET | ORAL | 0 refills | Status: AC

## 2024-02-09 MED ORDER — ISOSORBIDE MONONITRATE ER 30 MG PO TB24: 15.0000 mg | ORAL_TABLET | Freq: Every day | ORAL | 0 refills | Status: AC

## 2024-02-09 MED ORDER — METHYLPREDNISOLONE SODIUM SUCC 40 MG IJ SOLR: 40.0000 mg | INTRAMUSCULAR | Status: DC

## 2024-02-09 NOTE — Progress Notes (Signed)
SATURATION QUALIFICATIONS: (This note is used to comply with regulatory documentation for home oxygen)  Patient Saturations on Room Air at Rest = 93%  Patient Saturations on Room Air while Ambulating = 92%  Patient Saturations on 0 Liters of oxygen while Ambulating = 92%  Please briefly explain why patient needs home oxygen: 

## 2024-02-09 NOTE — Progress Notes (Signed)
 " PROGRESS NOTE    Tanner Chang  FMW:[id number] DOB: 09/28/1955 DOA: 02/06/2024 PCP: [facility]    Brief Narrative:  Tanner Chang  is a 69 y.o. male, with history of COPD not on home oxygen , chronic diastolic CHF last EF 60% in 2022, hypertension, dyslipidemia, anxiety, past history of smoking quit few years ago.   Patient who was in his usual state of health up till about 2 weeks ago and then subsequently started feeling fatigued, generalized weakness along with exertional shortness of breath and some wheezing, he lives with his mom but is not exposed to any sick contacts, no recent travel, no fever or chills, no chest pain or productive cough.  He went to see his PCP at [facility] and got some Lasix  a few days ago without any relief, he now presents to [facility] for continued symptoms.      Assessment and Plan: Alcohol  withdrawal - Significant CIWA scores requiring Ativan  - Will change to scheduled and as needed Ativan  for now -Withdrawal symptoms seem to have lessened and patient more oriented   ?AKI - improved- suspect from over-diuresis   Acute hypoxic respiratory failure in a patient with underlying history of COPD rule out  - Chest x-ray nonacute, CTA nonacute, EKG nonacute, troponin 2 sets stable, proBNP unremarkable.   -IV Solu-Medrol -Will continue as patient continues to have some wheezing - Was not on oxygen  this a.m.   Minimally elevated troponin.  Trend is flat and in non-ACS pattern, he is chest pain-free with stable EKG.  Workup as above.  Likely due to demand mismatch from exertional shortness of breath and resulting mild hypoxia. -Seen by cardiology: Echo to be done outpatient, follow-up outpatient-due to alcohol  withdrawal does not need any further workup inpatient    Hypertension. -Blood pressure lower end of normal so we will hold blood pressure medicines    Dyslipidemia.  Home statin continue.   GERD.  Continue PPI.   Depression and  anxiety Home medication regimen will be continued which includes Wellbutrin  XR and Prozac .    DVT prophylaxis: enoxaparin  (LOVENOX ) injection 40 mg Start: 02/06/24 1630    Code Status: Full Code Called sister/mother and updated  1/26  Disposition Plan:  Level of care: Progressive Status is: Inpatient     Consultants:  Cardiology   Subjective: Mental status improved  Objective: Vitals:   02/09/24 0338 02/09/24 0356 02/09/24 0713 02/09/24 0858  BP: (!) 143/97 (!) 143/97 (!) 105/94 123/85  Pulse: 99 99 94 99  Resp:   16   Temp: 97.8 F (36.6 C)  (!) 97.5 F (36.4 C)   TempSrc: Oral  Oral   SpO2: 92%  90%     Intake/Output Summary (Last 24 hours) at 02/09/2024 1001 Last data filed at 02/09/2024 0916 Gross per 24 hour  Intake 480 ml  Output --  Net 480 ml   There were no vitals filed for this visit.  Examination:   General: Appearance:    Obese male in no acute distress     Lungs:   Diminished, expiratory wheezing, not on oxygen   Heart:    Normal heart rate.   MS:   All extremities are intact.    Neurologic: Awake and oriented x 3       Data Reviewed: I have personally reviewed following labs and imaging studies  CBC: Recent Labs  Lab 02/06/24 1011 02/06/24 1110 02/06/24 1820 02/07/24 0238 02/08/24 0026 02/09/24 0232  WBC 8.6  --  6.0  10.8* 13.4* 12.2*  NEUTROABS 6.0  --   --  9.3*  --   --   HGB 14.6 14.3 14.6 14.2 13.6 13.1  HCT 41.3 42.0 42.0 40.8 39.2 39.1  MCV 86.8  --  87.5 87.2 87.5 90.5  PLT 269  --  265 274 266 225   Basic Metabolic Panel: Recent Labs  Lab 02/06/24 1011 02/06/24 1110 02/06/24 1820 02/07/24 0238 02/08/24 0026 02/09/24 0232  NA 137 136  --  136 138 141  K 3.9 3.8  --  3.9 4.0 4.1  CL 97*  --   --  97* 99 101  CO2 25  --   --  25 26 28   GLUCOSE 111*  --   --  141* 148* 120*  BUN 20  --   --  29* 38* 41*  CREATININE 1.18  --  1.39* 1.35* 1.24 1.08  CALCIUM  10.1  --   --  9.6 9.5 9.2  MG  --   --   --  1.8  --    --    GFR: CrCl cannot be calculated (Unknown ideal weight.). Liver Function Tests: Recent Labs  Lab 02/06/24 1011  AST 46*  ALT 41  ALKPHOS 86  BILITOT 1.1  PROT 7.4  ALBUMIN 4.7   No results for input(s): LIPASE, AMYLASE in the last 168 hours. No results for input(s): AMMONIA in the last 168 hours. Coagulation Profile: No results for input(s): INR, PROTIME in the last 168 hours. Cardiac Enzymes: No results for input(s): CKTOTAL, CKMB, CKMBINDEX, TROPONINI in the last 168 hours. BNP (last 3 results) Recent Labs    05/05/23 1852 06/25/23 1623 02/06/24 1011  PROBNP 85.7 39.9 78.2   HbA1C: No results for input(s): HGBA1C in the last 72 hours. CBG: Recent Labs  Lab 02/07/24 0420 02/09/24 0811  GLUCAP 157* 126*   Lipid Profile: No results for input(s): CHOL, HDL, LDLCALC, TRIG, CHOLHDL, LDLDIRECT in the last 72 hours. Thyroid Function Tests: No results for input(s): TSH, T4TOTAL, FREET4, T3FREE, THYROIDAB in the last 72 hours. Anemia Panel: No results for input(s): VITAMINB12, FOLATE, FERRITIN, TIBC, IRON, RETICCTPCT in the last 72 hours. Sepsis Labs: Recent Labs  Lab 02/06/24 1425  PROCALCITON 0.13    Recent Results (from the past 240 hours)  Resp panel by RT-PCR (RSV, Flu A&B, Covid) Anterior Nasal Swab     Status: None   Collection Time: 02/06/24 10:11 AM   Specimen: Anterior Nasal Swab  Result Value Ref Range Status   SARS Coronavirus 2 by RT PCR NEGATIVE NEGATIVE Final    Comment: (NOTE) SARS-CoV-2 target nucleic acids are NOT DETECTED.  The SARS-CoV-2 RNA is generally detectable in upper respiratory specimens during the acute phase of infection. The lowest concentration of SARS-CoV-2 viral copies this assay can detect is 138 copies/mL. A negative result does not preclude SARS-Cov-2 infection and should not be used as the sole basis for treatment or other patient management decisions. A negative  result may occur with  improper specimen collection/handling, submission of specimen other than nasopharyngeal swab, presence of viral mutation(s) within the areas targeted by this assay, and inadequate number of viral copies(<138 copies/mL). A negative result must be combined with clinical observations, patient history, and epidemiological information. The expected result is Negative.  Fact Sheet for Patients:  bloggercourse.com  Fact Sheet for Healthcare Providers:  seriousbroker.it  This test is no t yet approved or cleared by the [place]  FDA and  has been authorized for detection  and/or diagnosis of SARS-CoV-2 by FDA under an Emergency Use Authorization (EUA). This EUA will remain  in effect (meaning this test can be used) for the duration of the COVID-19 declaration under Section 564(b)(1) of the Act, 21 U.S.C.section 360bbb-3(b)(1), unless the authorization is terminated  or revoked sooner.       Influenza A by PCR NEGATIVE NEGATIVE Final   Influenza B by PCR NEGATIVE NEGATIVE Final    Comment: (NOTE) The Xpert Xpress SARS-CoV-2/FLU/RSV plus assay is intended as an aid in the diagnosis of influenza from Nasopharyngeal swab specimens and should not be used as a sole basis for treatment. Nasal washings and aspirates are unacceptable for Xpert Xpress SARS-CoV-2/FLU/RSV testing.  Fact Sheet for Patients: bloggercourse.com  Fact Sheet for Healthcare Providers: seriousbroker.it  This test is not yet approved or cleared by the [place]  FDA and has been authorized for detection and/or diagnosis of SARS-CoV-2 by FDA under an Emergency Use Authorization (EUA). This EUA will remain in effect (meaning this test can be used) for the duration of the COVID-19 declaration under Section 564(b)(1) of the Act, 21 U.S.C. section 360bbb-3(b)(1), unless the authorization is  terminated or revoked.     Resp Syncytial Virus by PCR NEGATIVE NEGATIVE Final    Comment: (NOTE) Fact Sheet for Patients: bloggercourse.com  Fact Sheet for Healthcare Providers: seriousbroker.it  This test is not yet approved or cleared by the [place]  FDA and has been authorized for detection and/or diagnosis of SARS-CoV-2 by FDA under an Emergency Use Authorization (EUA). This EUA will remain in effect (meaning this test can be used) for the duration of the COVID-19 declaration under Section 564(b)(1) of the Act, 21 U.S.C. section 360bbb-3(b)(1), unless the authorization is terminated or revoked.  Performed at [facility], [address], [place], [place] [address]   Respiratory (~20 pathogens) panel by PCR     Status: None   Collection Time: 02/06/24  3:33 PM   Specimen: Nasopharyngeal Swab; Respiratory  Result Value Ref Range Status   Adenovirus NOT DETECTED NOT DETECTED Final   Coronavirus 229E NOT DETECTED NOT DETECTED Final    Comment: (NOTE) The Coronavirus on the Respiratory Panel, DOES NOT test for the novel  Coronavirus (2019 nCoV)    Coronavirus HKU1 NOT DETECTED NOT DETECTED Final   Coronavirus NL63 NOT DETECTED NOT DETECTED Final   Coronavirus OC43 NOT DETECTED NOT DETECTED Final   Metapneumovirus NOT DETECTED NOT DETECTED Final   Rhinovirus / Enterovirus NOT DETECTED NOT DETECTED Final   Influenza A NOT DETECTED NOT DETECTED Final   Influenza B NOT DETECTED NOT DETECTED Final   Parainfluenza Virus 1 NOT DETECTED NOT DETECTED Final   Parainfluenza Virus 2 NOT DETECTED NOT DETECTED Final   Parainfluenza Virus 3 NOT DETECTED NOT DETECTED Final   Parainfluenza Virus 4 NOT DETECTED NOT DETECTED Final   Respiratory Syncytial Virus NOT DETECTED NOT DETECTED Final   Bordetella pertussis NOT DETECTED NOT DETECTED Final   Bordetella Parapertussis NOT DETECTED NOT DETECTED Final    Chlamydophila pneumoniae NOT DETECTED NOT DETECTED Final   Mycoplasma pneumoniae NOT DETECTED NOT DETECTED Final    Comment: Performed at [facility], [address], [place], [place] [address]         Radiology Studies: No results found.       Scheduled Meds:  aspirin  EC  81 mg Oral Daily   atorvastatin   10 mg Oral QHS   azithromycin   500 mg Oral Daily   buPROPion   300 mg Oral Q breakfast   enoxaparin  (LOVENOX ) injection  40 mg Subcutaneous Q24H   FLUoxetine   20 mg Oral q AM   folic acid   1 mg Oral Daily   isosorbide  mononitrate  15 mg Oral Daily   LORazepam   0-4 mg Intravenous Q4H   Followed by   LORazepam   0-4 mg Intravenous Q8H   methylPREDNISolone  (SOLU-MEDROL ) injection  80 mg Intravenous Q24H   multivitamin with minerals  1 tablet Oral Daily   pantoprazole   40 mg Oral Daily   thiamine   100 mg Oral Daily   Or   thiamine   100 mg Intravenous Daily   umeclidinium bromide   1 puff Inhalation Daily   Continuous Infusions:   LOS: 3 days    Time spent: 45 minutes spent on chart review, discussion with nursing staff, consultants, updating family and interview/physical exam; more than 50% of that time was spent in counseling and/or coordination of care.    Harlene RAYMOND Bowl, DO [facility] Available via Epic secure chat 7am-7pm After these hours, please refer to coverage provider listed on [email or web address] 02/09/2024, 10:01 AM   "

## 2024-02-09 NOTE — Discharge Summary (Addendum)
 "     Physician Discharge Summary  JERRALD DOVERSPIKE FMW:[id number] DOB: [birth year] DOA: 02/06/2024  PCP: [facility]  Admit date: 02/06/2024 Discharge date: 02/09/2024  Admitted From:  Discharge disposition: home (threatening to leave AMA-- will send in scripts to facilitate as safe d/c)   Recommendations for Outpatient Follow-Up:   Referral to cards-- needs echo outpatient (said he would like to go somewhere other than the [place]) Alcohol  cessation- resources given   Discharge Diagnosis:   Principal Problem:   SOB (shortness of breath) Alcohol  withdrawal   Discharge Condition: Improved.  Diet recommendation: Low sodium, heart healthy.  Wound care: None.  Code status: Full.   History of Present Illness:    Tanner Chang  is a 69 y.o. male, with history of COPD not on home oxygen , chronic diastolic CHF last EF 60% in 2022, hypertension, dyslipidemia, anxiety, past history of smoking quit few years ago.   Patient who was in his usual state of health up till about 2 weeks ago and then subsequently started feeling fatigued, generalized weakness along with exertional shortness of breath and some wheezing, he lives with his mom but is not exposed to any sick contacts, no recent travel, no fever or chills, no chest pain or productive cough.  He went to see his PCP at [facility] and got some Lasix  a few days ago without any relief, he now presents to [facility] for continued symptoms.   He currently complains of no headache, no fever or chills, no chest pain or palpitations, he does have shortness of breath along with generalized weakness, his main complaint is shortness of breath with even slightest of exertion, no orthopnea, no weight gain, he has not noticed any significant swelling, denies any chest pain, has not noticed any wheezing, in the ER he had a CT scan of the chest which was nonacute, initial lab work was unremarkable.  I was requested to admit the patient  for exertional shortness of breath due to possible COPD exacerbation.     Hospital Course by Problem:   Alcohol  withdrawal - CIWA low -Withdrawal symptoms seem to have resolved    ?AKI - improved- suspect from over-diuresis    Acute hypoxic respiratory failure in a patient with underlying history of COPD rule out  - Chest x-ray nonacute, CTA nonacute, EKG nonacute, troponin 2 sets stable, proBNP unremarkable.   -IV Solu-Medrol - change to PO to facilitate a safe d/c - Was not on oxygen  this a.m.   Minimally elevated troponin.  Trend is flat and in non-ACS pattern, he is chest pain-free with stable EKG.  Workup as above.  Likely due to demand mismatch from exertional shortness of breath and resulting mild hypoxia. -Seen by cardiology: Echo to be done outpatient, follow-up outpatient-due to alcohol  withdrawal does not need any further workup inpatient    Hypertension. -Blood pressure lower end of normal so we will hold blood pressure medicines    Dyslipidemia.  Home statin continue.   GERD.  Continue PPI.   Depression and anxiety Home medication regimen will be continued which includes Wellbutrin  XR and Prozac .    Medical Consultants:   cards   Discharge Exam:   Vitals:   02/09/24 1118 02/09/24 1540  BP: 138/79 128/87  Pulse: (!) 101 (!) 104  Resp: 20 20  Temp: (!) 97.5 F (36.4 C) 97.7 F (36.5 C)  SpO2: 93% 92%   Vitals:   02/09/24 0713 02/09/24 0858 02/09/24 1118 02/09/24 1540  BP: ROLLEN)  105/94 123/85 138/79 128/87  Pulse: 94 99 (!) 101 (!) 104  Resp: 16  20 20   Temp: (!) 97.5 F (36.4 C)  (!) 97.5 F (36.4 C) 97.7 F (36.5 C)  TempSrc: Oral  Oral Oral  SpO2: 90%  93% 92%    Wanting to leave AMA-- calm and A+Ox3-- will send in scripts to facilitate a safe discharge. He understands risk of leaving early including death.  He stated he had a family emergency and has to leave now   The results of significant diagnostics from this hospitalization (including  imaging, microbiology, ancillary and laboratory) are listed below for reference.     Procedures and Diagnostic Studies:   CT Angio Chest PE W and/or Wo Contrast Result Date: 02/06/2024 EXAM: CTA of the Chest with contrast for PE 02/06/2024 11:21:15 AM TECHNIQUE: CTA of the chest was performed after the administration of intravenous contrast. Multiplanar reformatted images are provided for review. MIP images are provided for review. Automated exposure control, iterative reconstruction, and/or weight based adjustment of the mA/kV was utilized to reduce the radiation dose to as low as reasonably achievable. COMPARISON: CTA chest 05/05/2023, chest x-ray 02/06/2024. CLINICAL HISTORY: 69 year old male. Pulmonary embolism (PE) suspected, high probability. Progressive shortness of breath and weakness, abnormal left lung base auscultation, recently started on Lasix . FINDINGS: PULMONARY ARTERIES: Pulmonary arteries are adequately opacified for evaluation. Mild mixing artifact in the main pulmonary arteries but good pulmonary artery contrast timing. No pulmonary embolism. Main pulmonary artery is normal in caliber. MEDIASTINUM: The heart demonstrates calcified coronary artery atherosclerosis. No pericardial effusion. The pericardium demonstrates no acute abnormality. The thoracic aorta is portuous with moderate aortic atherosclerosis. There is no acute abnormality of the thoracic aorta. LYMPH NODES: No mediastinal, hilar or axillary lymphadenopathy. LUNGS AND PLEURA: Improved lung volumes. Chronic mild elevation of the left hemidiaphragm is stable. Minor left lung base atelectasis. Subtle upper lobe predominant centrilobular emphysema suspected. No pleural effusion or pneumothorax. UPPER ABDOMEN: Limited images of the upper abdomen are unremarkable. SOFT TISSUES AND BONES: Superficial posterior dermal or subdermal sebaceous type cyst incidentally noted. Stable chronic left lateral 6 through 8 rib fractures. Also stable  chronic right posterior and lateral rib fractures. IMPRESSION: 1. No pulmonary embolism. 2. Possible emphysema. No acute or inflammatory process identified in the chest. 3. Aortic Atherosclerosis (ICD10-I70.0). Electronically signed by: Helayne Hurst MD 02/06/2024 11:55 AM EST RP Workstation: [id number]   DG Chest Portable 1 View Result Date: 02/06/2024 EXAM: 1 VIEW(S) XRAY OF THE CHEST 02/06/2024 10:12:45 AM COMPARISON: 06/25/2023 CLINICAL HISTORY: Shortness of breath. FINDINGS: LUNGS AND PLEURA: Mild elevation of left hemidiaphragm. No focal pulmonary opacity. No pleural effusion. No pneumothorax. HEART AND MEDIASTINUM: Aortic arch atherosclerotic calcifications. No acute abnormality of the cardiac and mediastinal silhouettes. BONES AND SOFT TISSUES: Remote bilateral rib fractures. IMPRESSION: 1. No acute cardiopulmonary abnormality. Electronically signed by: Katheleen Faes MD 02/06/2024 11:21 AM EST RP Workstation: [id number]     Labs:   Basic Metabolic Panel: Recent Labs  Lab 02/06/24 1011 02/06/24 1110 02/06/24 1820 02/07/24 0238 02/08/24 0026 02/09/24 0232  NA 137 136  --  136 138 141  K 3.9 3.8  --  3.9 4.0 4.1  CL 97*  --   --  97* 99 101  CO2 25  --   --  25 26 28   GLUCOSE 111*  --   --  141* 148* 120*  BUN 20  --   --  29* 38* 41*  CREATININE 1.18  --  1.39*  1.35* 1.24 1.08  CALCIUM  10.1  --   --  9.6 9.5 9.2  MG  --   --   --  1.8  --   --    GFR CrCl cannot be calculated (Unknown ideal weight.). Liver Function Tests: Recent Labs  Lab 02/06/24 1011  AST 46*  ALT 41  ALKPHOS 86  BILITOT 1.1  PROT 7.4  ALBUMIN 4.7   No results for input(s): LIPASE, AMYLASE in the last 168 hours. No results for input(s): AMMONIA in the last 168 hours. Coagulation profile No results for input(s): INR, PROTIME in the last 168 hours.  CBC: Recent Labs  Lab 02/06/24 1011 02/06/24 1110 02/06/24 1820 02/07/24 0238 02/08/24 0026 02/09/24 0232  WBC 8.6  --  6.0 10.8*  13.4* 12.2*  NEUTROABS 6.0  --   --  9.3*  --   --   HGB 14.6 14.3 14.6 14.2 13.6 13.1  HCT 41.3 42.0 42.0 40.8 39.2 39.1  MCV 86.8  --  87.5 87.2 87.5 90.5  PLT 269  --  265 274 266 225   Cardiac Enzymes: No results for input(s): CKTOTAL, CKMB, CKMBINDEX, TROPONINI in the last 168 hours. BNP: Invalid input(s): POCBNP CBG: Recent Labs  Lab 02/07/24 0420 02/09/24 0811  GLUCAP 157* 126*   D-Dimer No results for input(s): DDIMER in the last 72 hours. Hgb A1c No results for input(s): HGBA1C in the last 72 hours. Lipid Profile No results for input(s): CHOL, HDL, LDLCALC, TRIG, CHOLHDL, LDLDIRECT in the last 72 hours. Thyroid function studies No results for input(s): TSH, T4TOTAL, T3FREE, THYROIDAB in the last 72 hours.  Invalid input(s): FREET3 Anemia work up No results for input(s): VITAMINB12, FOLATE, FERRITIN, TIBC, IRON, RETICCTPCT in the last 72 hours. Microbiology Recent Results (from the past 240 hours)  Resp panel by RT-PCR (RSV, Flu A&B, Covid) Anterior Nasal Swab     Status: None   Collection Time: 02/06/24 10:11 AM   Specimen: Anterior Nasal Swab  Result Value Ref Range Status   SARS Coronavirus 2 by RT PCR NEGATIVE NEGATIVE Final    Comment: (NOTE) SARS-CoV-2 target nucleic acids are NOT DETECTED.  The SARS-CoV-2 RNA is generally detectable in upper respiratory specimens during the acute phase of infection. The lowest concentration of SARS-CoV-2 viral copies this assay can detect is 138 copies/mL. A negative result does not preclude SARS-Cov-2 infection and should not be used as the sole basis for treatment or other patient management decisions. A negative result may occur with  improper specimen collection/handling, submission of specimen other than nasopharyngeal swab, presence of viral mutation(s) within the areas targeted by this assay, and inadequate number of viral copies(<138 copies/mL). A negative result  must be combined with clinical observations, patient history, and epidemiological information. The expected result is Negative.  Fact Sheet for Patients:  bloggercourse.com  Fact Sheet for Healthcare Providers:  seriousbroker.it  This test is no t yet approved or cleared by the [place]  FDA and  has been authorized for detection and/or diagnosis of SARS-CoV-2 by FDA under an Emergency Use Authorization (EUA). This EUA will remain  in effect (meaning this test can be used) for the duration of the COVID-19 declaration under Section 564(b)(1) of the Act, 21 U.S.C.section 360bbb-3(b)(1), unless the authorization is terminated  or revoked sooner.       Influenza A by PCR NEGATIVE NEGATIVE Final   Influenza B by PCR NEGATIVE NEGATIVE Final    Comment: (NOTE) The Xpert Xpress SARS-CoV-2/FLU/RSV plus assay is  intended as an aid in the diagnosis of influenza from Nasopharyngeal swab specimens and should not be used as a sole basis for treatment. Nasal washings and aspirates are unacceptable for Xpert Xpress SARS-CoV-2/FLU/RSV testing.  Fact Sheet for Patients: bloggercourse.com  Fact Sheet for Healthcare Providers: seriousbroker.it  This test is not yet approved or cleared by the [place]  FDA and has been authorized for detection and/or diagnosis of SARS-CoV-2 by FDA under an Emergency Use Authorization (EUA). This EUA will remain in effect (meaning this test can be used) for the duration of the COVID-19 declaration under Section 564(b)(1) of the Act, 21 U.S.C. section 360bbb-3(b)(1), unless the authorization is terminated or revoked.     Resp Syncytial Virus by PCR NEGATIVE NEGATIVE Final    Comment: (NOTE) Fact Sheet for Patients: bloggercourse.com  Fact Sheet for Healthcare Providers: seriousbroker.it  This test is  not yet approved or cleared by the [place]  FDA and has been authorized for detection and/or diagnosis of SARS-CoV-2 by FDA under an Emergency Use Authorization (EUA). This EUA will remain in effect (meaning this test can be used) for the duration of the COVID-19 declaration under Section 564(b)(1) of the Act, 21 U.S.C. section 360bbb-3(b)(1), unless the authorization is terminated or revoked.  Performed at [facility], [address], [place], [place] [address]   Respiratory (~20 pathogens) panel by PCR     Status: None   Collection Time: 02/06/24  3:33 PM   Specimen: Nasopharyngeal Swab; Respiratory  Result Value Ref Range Status   Adenovirus NOT DETECTED NOT DETECTED Final   Coronavirus 229E NOT DETECTED NOT DETECTED Final    Comment: (NOTE) The Coronavirus on the Respiratory Panel, DOES NOT test for the novel  Coronavirus (2019 nCoV)    Coronavirus HKU1 NOT DETECTED NOT DETECTED Final   Coronavirus NL63 NOT DETECTED NOT DETECTED Final   Coronavirus OC43 NOT DETECTED NOT DETECTED Final   Metapneumovirus NOT DETECTED NOT DETECTED Final   Rhinovirus / Enterovirus NOT DETECTED NOT DETECTED Final   Influenza A NOT DETECTED NOT DETECTED Final   Influenza B NOT DETECTED NOT DETECTED Final   Parainfluenza Virus 1 NOT DETECTED NOT DETECTED Final   Parainfluenza Virus 2 NOT DETECTED NOT DETECTED Final   Parainfluenza Virus 3 NOT DETECTED NOT DETECTED Final   Parainfluenza Virus 4 NOT DETECTED NOT DETECTED Final   Respiratory Syncytial Virus NOT DETECTED NOT DETECTED Final   Bordetella pertussis NOT DETECTED NOT DETECTED Final   Bordetella Parapertussis NOT DETECTED NOT DETECTED Final   Chlamydophila pneumoniae NOT DETECTED NOT DETECTED Final   Mycoplasma pneumoniae NOT DETECTED NOT DETECTED Final    Comment: Performed at [facility], [address] [place], [place] [address]     Discharge Instructions:   Discharge Instructions     Ambulatory  referral to Cardiology   Complete by: As directed    Diet - low sodium heart healthy   Complete by: As directed    Discharge instructions   Complete by: As directed    Need to stop using alcohol .   Increase activity slowly   Complete by: As directed       Allergies as of 02/09/2024       Reactions   Bactrim [sulfamethoxazole-trimethoprim] Swelling, Other (See Comments)   Tongue swells   Penicillins Other (See Comments)   From childhood- reaction not recalled        Medication List     PAUSE taking these medications    losartan  100  MG tablet Wait to take this until your doctor or other care provider tells you to start again. Commonly known as: COZAAR  Take 100 mg by mouth in the morning.       STOP taking these medications    BC HEADACHE POWDER PO   doxycycline  100 MG capsule Commonly known as: VIBRAMYCIN        TAKE these medications    acetaminophen  500 MG tablet Commonly known as: TYLENOL  Take 1,000 mg by mouth every 6 (six) hours as needed for mild pain or headache.   albuterol  108 (90 Base) MCG/ACT inhaler Commonly known as: VENTOLIN  HFA Inhale 2 puffs into the lungs every 4 (four) hours as needed for wheezing or shortness of breath.   albuterol  (2.5 MG/3ML) 0.083% nebulizer solution Commonly known as: PROVENTIL  Take 3 mLs (2.5 mg total) by nebulization every 6 (six) hours as needed for wheezing or shortness of breath.   aspirin  EC 81 MG tablet Take 1 tablet (81 mg total) by mouth daily. Swallow whole. Start taking on: February 10, 2024   atorvastatin  10 MG tablet Commonly known as: LIPITOR Take 10 mg by mouth at bedtime.   buPROPion  300 MG 24 hr tablet Commonly known as: WELLBUTRIN  XL Take 300 mg by mouth daily with breakfast.   Combivent  Respimat 20-100 MCG/ACT Aers respimat Generic drug: Ipratropium-Albuterol  Inhale 2 puffs into the lungs daily.   FLUoxetine  20 MG capsule Commonly known as: PROZAC  Take 20 mg by mouth in the morning.    fluticasone -salmeterol 500-50 MCG/ACT Aepb Commonly known as: Wixela Inhub Inhale 1 puff into the lungs in the morning and at bedtime.   furosemide  40 MG tablet Commonly known as: LASIX  Take 1 tablet (40 mg total) by mouth daily.   isosorbide  mononitrate 30 MG 24 hr tablet Commonly known as: IMDUR  Take 0.5 tablets (15 mg total) by mouth daily. Start taking on: February 10, 2024   METAMUCIL PO Take by mouth See admin instructions. Mix 1 tablespoonful of powder into 8 ounces of water and drink once a day as needed for constipation   omeprazole 20 MG capsule Commonly known as: PRILOSEC Take 20 mg by mouth daily before breakfast.   One-A-Day Mens 50+ Advantage Tabs Take 1 tablet by mouth daily with breakfast.   OVER THE COUNTER MEDICATION Take 1 tablet by mouth daily. Vitamin d , vitamin c, zinc combo vitamin   Oxcarbazepine  300 MG tablet Commonly known as: TRILEPTAL  Take 300 mg by mouth 2 (two) times daily.   potassium chloride  SA 20 MEQ tablet Commonly known as: KLOR-CON  M Take 1 tablet (20 mEq total) by mouth daily.   predniSONE  50 MG tablet Commonly known as: DELTASONE  1 tab daily What changed:  medication strength additional instructions   Spiriva  Respimat 2.5 MCG/ACT Aers Generic drug: Tiotropium Bromide  Inhale 2 puffs into the lungs daily.        Follow-up Information     [facility] Follow up in 1 week(s).   Specialty: General Practice Contact information: [address] [place] [address] [phone]                  Time coordinating discharge: 45  Signed:  Harlene RAYMOND Bowl DO  [facility] 02/09/2024, 5:13 PM      "

## 2024-02-09 NOTE — Progress Notes (Signed)
 Mobility Specialist Progress Note:    02/09/24 1243  Mobility  Activity Pivoted/transferred from chair to bed  Level of Assistance Moderate assist, patient does 50-74% (+2)  Assistive Device Other (Comment) (Chair turn/Slide Transfer)  Distance Ambulated (ft) 3 ft  Range of Motion/Exercises Active  Activity Response Tolerated fair  Mobility Referral Yes  Mobility visit 1 Mobility  Mobility Specialist Start Time (ACUTE ONLY) 1243  Mobility Specialist Stop Time (ACUTE ONLY) 1249  Mobility Specialist Time Calculation (min) (ACUTE ONLY) 6 min   Received pt sitting in recliner w/ request from pt and RN to transfer pt back to bed. 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 Pt able to move and transfer decently w/ assist. Pt able to put and lay self back in bed. Left pt in bed w/ all needs met and RN in room.   Tanner Chang Mobility Specialist Please Neurosurgeon or Rehab Office at [phone]

## 2024-02-09 NOTE — Plan of Care (Signed)
?  Problem: Clinical Measurements: ?Goal: Respiratory complications will improve ?Outcome: Progressing ?  ?Problem: Activity: ?Goal: Risk for activity intolerance will decrease ?Outcome: Progressing ?  ?Problem: Nutrition: ?Goal: Adequate nutrition will be maintained ?Outcome: Progressing ?  ?Problem: Coping: ?Goal: Level of anxiety will decrease ?Outcome: Progressing ?  ?

## 2024-02-09 NOTE — Plan of Care (Signed)

## 2024-02-10 NOTE — Progress Notes (Unsigned)
 CARDIOLOGY CONSULT NOTE       Patient ID: Tanner Chang MRN: [id number] DOB/AGE: Feb 21, 1955 69 y.o.  Admit date: (Not on file) Referring Physician: *** Primary Physician: [facility] Primary Cardiologist: *** Reason for Consultation: ***  Active Problems:   * No active hospital problems. *   HPI:  ***  ROS All other systems reviewed and negative except as noted above  Past Medical History:  Diagnosis Date   CHF (congestive heart failure) (HCC)    COPD (chronic obstructive pulmonary disease) (HCC)    Hypertension    Long COVID     No family history on file.  Social History   Socioeconomic History   Marital status: Divorced    Spouse name: Not on file   Number of children: Not on file   Years of education: Not on file   Highest education level: Not on file  Occupational History   Not on file  Tobacco Use   Smoking status: Former    Current packs/day: 0.00    Average packs/day: 2.0 packs/day for 20.0 years (40.0 ttl pk-yrs)    Types: Cigarettes    Start date: 06/1998    Quit date: 06/2018    Years since quitting: 5.6    Passive exposure: Past   Smokeless tobacco: Former  Substance and Sexual Activity   Alcohol  use: Yes   Drug use: Not Currently   Sexual activity: Not Currently  Other Topics Concern   Not on file  Social History Narrative   Not on file   Social Drivers of Health   Tobacco Use: Medium Risk (02/06/2024)   Patient History    Smoking Tobacco Use: Former    Smokeless Tobacco Use: Former    Passive Exposure: Past  Programmer, Applications: Not on Ship [employer]ker Insecurity: No Food Insecurity (02/06/2024)   Epic    Worried About Programme Researcher, Broadcasting/film/video in the Last Year: Never true    Ran Out of Food in the Last Year: Never true  Transportation Needs: No Transportation Needs (02/06/2024)   Epic    Lack of Transportation (Medical): No    Lack of Transportation (Non-Medical): No  Physical Activity: Not on file  Stress: Not on file   Social Connections: Moderately Isolated (02/06/2024)   Social Connection and Isolation Panel    Frequency of Communication with Friends and Family: Three times a week    Frequency of Social Gatherings with Friends and Family: Three times a week    Attends Religious Services: Never    Active Member of Clubs or Organizations: Yes    Attends Banker Meetings: 1 to 4 times per year    Marital Status: Separated  Intimate Partner Violence: Not At Risk (02/06/2024)   Epic    Fear of Current or Ex-Partner: No    Emotionally Abused: No    Physically Abused: No    Sexually Abused: No  Depression (PHQ2-9): Not on file  Alcohol  Screen: Not on file  Housing: Low Risk (02/06/2024)   Epic    Unable to Pay for Housing in the Last Year: No    Number of Times Moved in the Last Year: 0    Homeless in the Last Year: No  Utilities: Not At Risk (02/06/2024)   Epic    Threatened with loss of utilities: No  Health Literacy: Not on file    Past Surgical History:  Procedure Laterality Date   BACK SURGERY  1990     Current  Medications[1]    Physical Exam: There were no vitals taken for this visit. *** HELP TEXT ***  This SmartLink requires parameters. Parameters are variables that are added to the [facility] name to request specific information. The parameter for .curwt is the number of encounters to display readings from.  For example: .curwt[4  In this example, the SmartLink displays readings from the last four encounters.    {physical [id number]}  Labs:   Lab Results  Component Value Date   WBC 12.2 (H) 02/09/2024   HGB 13.1 02/09/2024   HCT 39.1 02/09/2024   MCV 90.5 02/09/2024   PLT 225 02/09/2024    Recent Labs  Lab 02/06/24 1011 02/06/24 1110 02/09/24 0232  NA 137   < > 141  K 3.9   < > 4.1  CL 97*   < > 101  CO2 25   < > 28  BUN 20   < > 41*  CREATININE 1.18   < > 1.08  CALCIUM  10.1   < > 9.2  PROT 7.4  --   --   BILITOT 1.1  --   --   ALKPHOS 86  --    --   ALT 41  --   --   AST 46*  --   --   GLUCOSE 111*   < > 120*   < > = values in this interval not displayed.   No results found for: CKTOTAL, CKMB, CKMBINDEX, TROPONINI No results found for: CHOL No results found for: HDL No results found for: LDLCALC No results found for: TRIG No results found for: CHOLHDL No results found for: LDLDIRECT    Radiology: CT Angio Chest PE W and/or Wo Contrast Result Date: 02/06/2024 EXAM: CTA of the Chest with contrast for PE 02/06/2024 11:21:15 AM TECHNIQUE: CTA of the chest was performed after the administration of intravenous contrast. Multiplanar reformatted images are provided for review. MIP images are provided for review. Automated exposure control, iterative reconstruction, and/or weight based adjustment of the mA/kV was utilized to reduce the radiation dose to as low as reasonably achievable. COMPARISON: CTA chest 05/05/2023, chest x-ray 02/06/2024. CLINICAL HISTORY: 69 year old male. Pulmonary embolism (PE) suspected, high probability. Progressive shortness of breath and weakness, abnormal left lung base auscultation, recently started on Lasix . FINDINGS: PULMONARY ARTERIES: Pulmonary arteries are adequately opacified for evaluation. Mild mixing artifact in the main pulmonary arteries but good pulmonary artery contrast timing. No pulmonary embolism. Main pulmonary artery is normal in caliber. MEDIASTINUM: The heart demonstrates calcified coronary artery atherosclerosis. No pericardial effusion. The pericardium demonstrates no acute abnormality. The thoracic aorta is portuous with moderate aortic atherosclerosis. There is no acute abnormality of the thoracic aorta. LYMPH NODES: No mediastinal, hilar or axillary lymphadenopathy. LUNGS AND PLEURA: Improved lung volumes. Chronic mild elevation of the left hemidiaphragm is stable. Minor left lung base atelectasis. Subtle upper lobe predominant centrilobular emphysema suspected. No pleural  effusion or pneumothorax. UPPER ABDOMEN: Limited images of the upper abdomen are unremarkable. SOFT TISSUES AND BONES: Superficial posterior dermal or subdermal sebaceous type cyst incidentally noted. Stable chronic left lateral 6 through 8 rib fractures. Also stable chronic right posterior and lateral rib fractures. IMPRESSION: 1. No pulmonary embolism. 2. Possible emphysema. No acute or inflammatory process identified in the chest. 3. Aortic Atherosclerosis (ICD10-I70.0). Electronically signed by: Helayne Hurst MD 02/06/2024 11:55 AM EST RP Workstation: [id number]   DG Chest Portable 1 View Result Date: 02/06/2024 EXAM: 1 VIEW(S) XRAY OF THE CHEST 02/06/2024 10:12:45 AM COMPARISON: 06/25/2023  CLINICAL HISTORY: Shortness of breath. FINDINGS: LUNGS AND PLEURA: Mild elevation of left hemidiaphragm. No focal pulmonary opacity. No pleural effusion. No pneumothorax. HEART AND MEDIASTINUM: Aortic arch atherosclerotic calcifications. No acute abnormality of the cardiac and mediastinal silhouettes. BONES AND SOFT TISSUES: Remote bilateral rib fractures. IMPRESSION: 1. No acute cardiopulmonary abnormality. Electronically signed by: Katheleen Faes MD 02/06/2024 11:21 AM EST RP Workstation: [id number]    EKG: ***   ASSESSMENT AND PLAN:    Signed: Maude Emmer 02/10/2024, 8:38 PM      [1]  Current Outpatient Medications:    acetaminophen  (TYLENOL ) 500 MG tablet, Take 1,000 mg by mouth every 6 (six) hours as needed for mild pain or headache., Disp: , Rfl:    albuterol  (PROVENTIL ) (2.5 MG/3ML) 0.083% nebulizer solution, Take 3 mLs (2.5 mg total) by nebulization every 6 (six) hours as needed for wheezing or shortness of breath., Disp: 75 mL, Rfl: 12   albuterol  (VENTOLIN  HFA) 108 (90 Base) MCG/ACT inhaler, Inhale 2 puffs into the lungs every 4 (four) hours as needed for wheezing or shortness of breath., Disp: 6.7 g, Rfl: 0   aspirin  EC 81 MG tablet, Take 1 tablet (81 mg total) by mouth daily. Swallow whole.,  Disp: , Rfl:    atorvastatin  (LIPITOR) 10 MG tablet, Take 10 mg by mouth at bedtime., Disp: , Rfl:    buPROPion  (WELLBUTRIN  XL) 300 MG 24 hr tablet, Take 300 mg by mouth daily with breakfast., Disp: , Rfl:    FLUoxetine  (PROZAC ) 20 MG capsule, Take 20 mg by mouth in the morning., Disp: , Rfl:    fluticasone -salmeterol (WIXELA INHUB) 500-50 MCG/ACT AEPB, Inhale 1 puff into the lungs in the morning and at bedtime., Disp: 60 each, Rfl: 4   furosemide  (LASIX ) 40 MG tablet, Take 1 tablet (40 mg total) by mouth daily., Disp: 30 tablet, Rfl: 2   Ipratropium-Albuterol  (COMBIVENT  RESPIMAT) 20-100 MCG/ACT AERS respimat, Inhale 2 puffs into the lungs daily., Disp: , Rfl:    isosorbide  mononitrate (IMDUR ) 30 MG 24 hr tablet, Take 0.5 tablets (15 mg total) by mouth daily., Disp: 30 tablet, Rfl: 0   [Paused] losartan  (COZAAR ) 100 MG tablet, Take 100 mg by mouth in the morning., Disp: , Rfl:    Multiple Vitamins-Minerals (ONE-A-DAY MENS 50+ ADVANTAGE) TABS, Take 1 tablet by mouth daily with breakfast., Disp: , Rfl:    omeprazole (PRILOSEC) 20 MG capsule, Take 20 mg by mouth daily before breakfast., Disp: , Rfl:    OVER THE COUNTER MEDICATION, Take 1 tablet by mouth daily. Vitamin d , vitamin c, zinc combo vitamin, Disp: , Rfl:    Oxcarbazepine  (TRILEPTAL ) 300 MG tablet, Take 300 mg by mouth 2 (two) times daily., Disp: , Rfl:    potassium chloride  SA (KLOR-CON ) 20 MEQ tablet, Take 1 tablet (20 mEq total) by mouth daily., Disp: 3 tablet, Rfl: 0   predniSONE  (DELTASONE ) 50 MG tablet, 1 tab daily, Disp: 5 tablet, Rfl: 0   Psyllium (METAMUCIL PO), Take by mouth See admin instructions. Mix 1 tablespoonful of powder into 8 ounces of water and drink once a day as needed for constipation, Disp: , Rfl:    Tiotropium Bromide  Monohydrate (SPIRIVA  RESPIMAT) 2.5 MCG/ACT AERS, Inhale 2 puffs into the lungs daily., Disp: 4 g, Rfl: 4

## 2024-02-12 ENCOUNTER — Ambulatory Visit: Admitting: Cardiovascular Disease

## 2024-03-09 ENCOUNTER — Ambulatory Visit: Admitting: Internal Medicine
